# Patient Record
Sex: Female | Born: 1985 | Race: Black or African American | Hispanic: No | Marital: Single | Smoking: Former smoker
Health system: Southern US, Community
[De-identification: ages and names within clinical notes are randomized; demographics above are authoritative.]

## PROBLEM LIST (undated history)

## (undated) ENCOUNTER — Inpatient Hospital Stay (HOSPITAL_COMMUNITY): Payer: Self-pay

## (undated) ENCOUNTER — Ambulatory Visit: Payer: MEDICAID

## (undated) DIAGNOSIS — E785 Hyperlipidemia, unspecified: Secondary | ICD-10-CM

## (undated) DIAGNOSIS — B379 Candidiasis, unspecified: Secondary | ICD-10-CM

## (undated) DIAGNOSIS — E669 Obesity, unspecified: Secondary | ICD-10-CM

## (undated) DIAGNOSIS — N760 Acute vaginitis: Secondary | ICD-10-CM

## (undated) DIAGNOSIS — M779 Enthesopathy, unspecified: Secondary | ICD-10-CM

## (undated) DIAGNOSIS — R29898 Other symptoms and signs involving the musculoskeletal system: Secondary | ICD-10-CM

## (undated) DIAGNOSIS — F319 Bipolar disorder, unspecified: Secondary | ICD-10-CM

## (undated) DIAGNOSIS — R87619 Unspecified abnormal cytological findings in specimens from cervix uteri: Secondary | ICD-10-CM

## (undated) DIAGNOSIS — Z22330 Carrier of Group B streptococcus: Secondary | ICD-10-CM

## (undated) DIAGNOSIS — F32A Depression, unspecified: Secondary | ICD-10-CM

## (undated) DIAGNOSIS — T8859XA Other complications of anesthesia, initial encounter: Secondary | ICD-10-CM

## (undated) DIAGNOSIS — B9689 Other specified bacterial agents as the cause of diseases classified elsewhere: Secondary | ICD-10-CM

## (undated) DIAGNOSIS — A599 Trichomoniasis, unspecified: Secondary | ICD-10-CM

## (undated) DIAGNOSIS — F419 Anxiety disorder, unspecified: Secondary | ICD-10-CM

## (undated) DIAGNOSIS — F329 Major depressive disorder, single episode, unspecified: Secondary | ICD-10-CM

## (undated) DIAGNOSIS — G5600 Carpal tunnel syndrome, unspecified upper limb: Secondary | ICD-10-CM

## (undated) DIAGNOSIS — Z8742 Personal history of other diseases of the female genital tract: Secondary | ICD-10-CM

## (undated) DIAGNOSIS — O24419 Gestational diabetes mellitus in pregnancy, unspecified control: Secondary | ICD-10-CM

## (undated) DIAGNOSIS — IMO0002 Reserved for concepts with insufficient information to code with codable children: Secondary | ICD-10-CM

## (undated) DIAGNOSIS — D649 Anemia, unspecified: Secondary | ICD-10-CM

## (undated) DIAGNOSIS — R87629 Unspecified abnormal cytological findings in specimens from vagina: Secondary | ICD-10-CM

## (undated) DIAGNOSIS — Z9151 Personal history of suicidal behavior: Secondary | ICD-10-CM

## (undated) DIAGNOSIS — Z8619 Personal history of other infectious and parasitic diseases: Secondary | ICD-10-CM

## (undated) DIAGNOSIS — Z915 Personal history of self-harm: Secondary | ICD-10-CM

## (undated) DIAGNOSIS — Z8614 Personal history of Methicillin resistant Staphylococcus aureus infection: Secondary | ICD-10-CM

## (undated) HISTORY — PX: MOUTH SURGERY: SHX715

## (undated) HISTORY — DX: Depression, unspecified: F32.A

## (undated) HISTORY — DX: Unspecified abnormal cytological findings in specimens from vagina: R87.629

## (undated) HISTORY — DX: Personal history of Methicillin resistant Staphylococcus aureus infection: Z86.14

## (undated) HISTORY — DX: Anxiety disorder, unspecified: F41.9

## (undated) HISTORY — DX: Reserved for concepts with insufficient information to code with codable children: IMO0002

## (undated) HISTORY — DX: Carpal tunnel syndrome, unspecified upper limb: G56.00

## (undated) HISTORY — DX: Personal history of other infectious and parasitic diseases: Z86.19

## (undated) HISTORY — DX: Personal history of suicidal behavior: Z91.51

## (undated) HISTORY — DX: Trichomoniasis, unspecified: A59.9

## (undated) HISTORY — DX: Unspecified abnormal cytological findings in specimens from cervix uteri: R87.619

## (undated) HISTORY — DX: Other symptoms and signs involving the musculoskeletal system: R29.898

## (undated) HISTORY — DX: Other specified bacterial agents as the cause of diseases classified elsewhere: B96.89

## (undated) HISTORY — PX: HERNIA REPAIR: SHX51

## (undated) HISTORY — DX: Obesity, unspecified: E66.9

## (undated) HISTORY — DX: Hyperlipidemia, unspecified: E78.5

## (undated) HISTORY — DX: Personal history of other diseases of the female genital tract: Z87.42

## (undated) HISTORY — DX: Carrier of group B Streptococcus: Z22.330

## (undated) HISTORY — DX: Major depressive disorder, single episode, unspecified: F32.9

## (undated) HISTORY — DX: Bipolar disorder, unspecified: F31.9

## (undated) HISTORY — DX: Personal history of self-harm: Z91.5

## (undated) HISTORY — DX: Acute vaginitis: N76.0

## (undated) HISTORY — DX: Candidiasis, unspecified: B37.9

## (undated) HISTORY — PX: UMBILICAL HERNIA REPAIR: SHX2598

## (undated) HISTORY — DX: Anemia, unspecified: D64.9

---
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
## 2006-10-21 ENCOUNTER — Inpatient Hospital Stay (HOSPITAL_COMMUNITY): Admission: AD | Admit: 2006-10-21 | Discharge: 2006-10-21 | Payer: Self-pay | Admitting: Gynecology

## 2006-10-23 ENCOUNTER — Ambulatory Visit: Payer: Self-pay | Admitting: Internal Medicine

## 2006-10-23 LAB — CONVERTED CEMR LAB
Chlamydia, Swab/Urine, PCR: NEGATIVE
GC Probe Amp, Urine: NEGATIVE

## 2006-12-03 ENCOUNTER — Emergency Department (HOSPITAL_COMMUNITY): Admission: EM | Admit: 2006-12-03 | Discharge: 2006-12-04 | Payer: Self-pay | Admitting: Emergency Medicine

## 2006-12-04 ENCOUNTER — Emergency Department (HOSPITAL_COMMUNITY): Admission: EM | Admit: 2006-12-04 | Discharge: 2006-12-04 | Payer: Self-pay | Admitting: Emergency Medicine

## 2006-12-05 ENCOUNTER — Emergency Department (HOSPITAL_COMMUNITY): Admission: EM | Admit: 2006-12-05 | Discharge: 2006-12-05 | Payer: Self-pay | Admitting: Family Medicine

## 2006-12-09 ENCOUNTER — Ambulatory Visit: Payer: Self-pay | Admitting: Internal Medicine

## 2006-12-17 ENCOUNTER — Ambulatory Visit: Payer: Self-pay | Admitting: Internal Medicine

## 2006-12-30 ENCOUNTER — Ambulatory Visit: Payer: Self-pay | Admitting: Internal Medicine

## 2007-02-18 ENCOUNTER — Ambulatory Visit: Payer: Self-pay | Admitting: Internal Medicine

## 2007-03-12 DIAGNOSIS — Z8742 Personal history of other diseases of the female genital tract: Secondary | ICD-10-CM

## 2007-03-12 HISTORY — DX: Personal history of other diseases of the female genital tract: Z87.42

## 2007-04-01 ENCOUNTER — Ambulatory Visit (HOSPITAL_COMMUNITY): Admission: RE | Admit: 2007-04-01 | Discharge: 2007-04-01 | Payer: Self-pay | Admitting: Family Medicine

## 2007-04-29 ENCOUNTER — Ambulatory Visit (HOSPITAL_COMMUNITY): Admission: RE | Admit: 2007-04-29 | Discharge: 2007-04-29 | Payer: Self-pay | Admitting: Family Medicine

## 2007-05-20 ENCOUNTER — Ambulatory Visit (HOSPITAL_COMMUNITY): Admission: RE | Admit: 2007-05-20 | Discharge: 2007-05-20 | Payer: Self-pay | Admitting: Obstetrics & Gynecology

## 2007-10-13 ENCOUNTER — Inpatient Hospital Stay (HOSPITAL_COMMUNITY): Admission: AD | Admit: 2007-10-13 | Discharge: 2007-10-16 | Payer: Self-pay | Admitting: Obstetrics & Gynecology

## 2008-04-24 ENCOUNTER — Emergency Department (HOSPITAL_COMMUNITY): Admission: EM | Admit: 2008-04-24 | Discharge: 2008-04-24 | Payer: Self-pay | Admitting: Emergency Medicine

## 2008-08-18 ENCOUNTER — Inpatient Hospital Stay (HOSPITAL_COMMUNITY): Admission: AD | Admit: 2008-08-18 | Discharge: 2008-08-19 | Payer: Self-pay | Admitting: Obstetrics and Gynecology

## 2009-01-21 ENCOUNTER — Inpatient Hospital Stay (HOSPITAL_COMMUNITY): Admission: AD | Admit: 2009-01-21 | Discharge: 2009-01-21 | Payer: Self-pay | Admitting: Obstetrics and Gynecology

## 2009-02-14 ENCOUNTER — Inpatient Hospital Stay (HOSPITAL_COMMUNITY): Admission: AD | Admit: 2009-02-14 | Discharge: 2009-02-14 | Payer: Self-pay | Admitting: Obstetrics and Gynecology

## 2009-03-01 ENCOUNTER — Inpatient Hospital Stay (HOSPITAL_COMMUNITY): Admission: AD | Admit: 2009-03-01 | Discharge: 2009-03-01 | Payer: Self-pay | Admitting: Obstetrics and Gynecology

## 2009-03-11 DIAGNOSIS — Z8614 Personal history of Methicillin resistant Staphylococcus aureus infection: Secondary | ICD-10-CM

## 2009-03-11 HISTORY — DX: Personal history of Methicillin resistant Staphylococcus aureus infection: Z86.14

## 2009-03-27 ENCOUNTER — Inpatient Hospital Stay (HOSPITAL_COMMUNITY): Admission: AD | Admit: 2009-03-27 | Discharge: 2009-03-27 | Payer: Self-pay | Admitting: Obstetrics and Gynecology

## 2009-03-29 ENCOUNTER — Inpatient Hospital Stay (HOSPITAL_COMMUNITY): Admission: AD | Admit: 2009-03-29 | Discharge: 2009-04-01 | Payer: Self-pay | Admitting: Obstetrics and Gynecology

## 2009-04-02 ENCOUNTER — Inpatient Hospital Stay (HOSPITAL_COMMUNITY): Admission: AD | Admit: 2009-04-02 | Discharge: 2009-04-02 | Payer: Self-pay | Admitting: Obstetrics and Gynecology

## 2010-03-11 NOTE — L&D Delivery Note (Signed)
Delivery Note    At 10:48 PM a viable female was delivered via Vaginal, Spontaneous Delivery (Presentation: OA).  APGAR: 8, 9; weight 6 lb 1.9 oz (2775 g).   Placenta status: Intact, Spontaneous.  Cord: 3 vessels with the following complications: Hyperspiraled.    Anesthesia: None  Episiotomy: None Lacerations: None Suture Repair: n/a Est. Blood Loss (mL): 200  Mom to postpartum.  Baby to nursery-stable Pt has not decided RE: circumcision   Dr Estanislado Pandy notified  Malissa Hippo 01/21/2011, 12:45 AM

## 2010-05-09 DIAGNOSIS — B9689 Other specified bacterial agents as the cause of diseases classified elsewhere: Secondary | ICD-10-CM

## 2010-05-09 DIAGNOSIS — Z8742 Personal history of other diseases of the female genital tract: Secondary | ICD-10-CM

## 2010-05-09 HISTORY — DX: Personal history of other diseases of the female genital tract: Z87.42

## 2010-05-09 HISTORY — DX: Other specified bacterial agents as the cause of diseases classified elsewhere: B96.89

## 2010-05-27 LAB — URINALYSIS, ROUTINE W REFLEX MICROSCOPIC
Bilirubin Urine: NEGATIVE
Glucose, UA: NEGATIVE mg/dL
Ketones, ur: NEGATIVE mg/dL
Nitrite: NEGATIVE
Protein, ur: NEGATIVE mg/dL
Specific Gravity, Urine: 1.01 (ref 1.005–1.030)
Urobilinogen, UA: 0.2 mg/dL (ref 0.0–1.0)
pH: 6.5 (ref 5.0–8.0)

## 2010-05-27 LAB — CBC
HCT: 31.8 % — ABNORMAL LOW (ref 36.0–46.0)
HCT: 34.5 % — ABNORMAL LOW (ref 36.0–46.0)
Hemoglobin: 10.2 g/dL — ABNORMAL LOW (ref 12.0–15.0)
Hemoglobin: 10.4 g/dL — ABNORMAL LOW (ref 12.0–15.0)
Hemoglobin: 11.1 g/dL — ABNORMAL LOW (ref 12.0–15.0)
MCHC: 32.4 g/dL (ref 30.0–36.0)
MCHC: 32.7 g/dL (ref 30.0–36.0)
MCV: 82.2 fL (ref 78.0–100.0)
MCV: 82.5 fL (ref 78.0–100.0)
Platelets: 249 K/uL (ref 150–400)
Platelets: 258 10*3/uL (ref 150–400)
RBC: 3.81 MIL/uL — ABNORMAL LOW (ref 3.87–5.11)
RBC: 3.87 MIL/uL (ref 3.87–5.11)
RDW: 15.7 % — ABNORMAL HIGH (ref 11.5–15.5)
RDW: 16 % — ABNORMAL HIGH (ref 11.5–15.5)
WBC: 7.6 K/uL (ref 4.0–10.5)
WBC: 8.9 10*3/uL (ref 4.0–10.5)
WBC: 9.4 10*3/uL (ref 4.0–10.5)

## 2010-05-27 LAB — DIFFERENTIAL
Basophils Absolute: 0.1 K/uL (ref 0.0–0.1)
Basophils Relative: 1 % (ref 0–1)
Eosinophils Absolute: 0.2 K/uL (ref 0.0–0.7)
Eosinophils Relative: 2 % (ref 0–5)
Lymphocytes Relative: 11 % — ABNORMAL LOW (ref 12–46)
Lymphs Abs: 0.8 K/uL (ref 0.7–4.0)
Monocytes Absolute: 1.9 K/uL — ABNORMAL HIGH (ref 0.1–1.0)
Monocytes Relative: 25 % — ABNORMAL HIGH (ref 3–12)
Neutro Abs: 4.6 K/uL (ref 1.7–7.7)
Neutrophils Relative %: 61 % (ref 43–77)

## 2010-05-27 LAB — URINE CULTURE: Colony Count: 40000

## 2010-05-27 LAB — URINE MICROSCOPIC-ADD ON

## 2010-06-11 LAB — CBC
HCT: 32 % — ABNORMAL LOW (ref 36.0–46.0)
Hemoglobin: 10.4 g/dL — ABNORMAL LOW (ref 12.0–15.0)
MCHC: 32.7 g/dL (ref 30.0–36.0)
RBC: 3.88 MIL/uL (ref 3.87–5.11)

## 2010-06-11 LAB — URINALYSIS, ROUTINE W REFLEX MICROSCOPIC
Bilirubin Urine: NEGATIVE
Hgb urine dipstick: NEGATIVE
Ketones, ur: NEGATIVE mg/dL
Specific Gravity, Urine: 1.01 (ref 1.005–1.030)
Urobilinogen, UA: 0.2 mg/dL (ref 0.0–1.0)

## 2010-06-11 LAB — COMPREHENSIVE METABOLIC PANEL
Alkaline Phosphatase: 158 U/L — ABNORMAL HIGH (ref 39–117)
BUN: 2 mg/dL — ABNORMAL LOW (ref 6–23)
Chloride: 105 mEq/L (ref 96–112)
Creatinine, Ser: 0.5 mg/dL (ref 0.4–1.2)
Glucose, Bld: 74 mg/dL (ref 70–99)
Potassium: 3.9 mEq/L (ref 3.5–5.1)
Total Bilirubin: 0.2 mg/dL — ABNORMAL LOW (ref 0.3–1.2)
Total Protein: 6.3 g/dL (ref 6.0–8.3)

## 2010-06-11 LAB — URINE MICROSCOPIC-ADD ON

## 2010-06-11 LAB — URINE CULTURE

## 2010-06-12 LAB — WET PREP, GENITAL: Clue Cells Wet Prep HPF POC: NONE SEEN

## 2010-06-13 LAB — GC/CHLAMYDIA PROBE AMP, GENITAL: GC Probe Amp, Genital: NEGATIVE

## 2010-06-13 LAB — URINE MICROSCOPIC-ADD ON

## 2010-06-13 LAB — WET PREP, GENITAL
Clue Cells Wet Prep HPF POC: NONE SEEN
Trich, Wet Prep: NONE SEEN
Yeast Wet Prep HPF POC: NONE SEEN

## 2010-06-13 LAB — URINALYSIS, ROUTINE W REFLEX MICROSCOPIC
Nitrite: NEGATIVE
Specific Gravity, Urine: 1.01 (ref 1.005–1.030)
Urobilinogen, UA: 0.2 mg/dL (ref 0.0–1.0)

## 2010-06-18 LAB — CBC
HCT: 36.9 % (ref 36.0–46.0)
Hemoglobin: 12.4 g/dL (ref 12.0–15.0)
MCHC: 33.5 g/dL (ref 30.0–36.0)
MCV: 82.1 fL (ref 78.0–100.0)
Platelets: 296 10*3/uL (ref 150–400)
RDW: 15.8 % — ABNORMAL HIGH (ref 11.5–15.5)

## 2010-06-18 LAB — WET PREP, GENITAL
Clue Cells Wet Prep HPF POC: NONE SEEN
Trich, Wet Prep: NONE SEEN

## 2010-06-18 LAB — ABO/RH: ABO/RH(D): A POS

## 2010-06-18 LAB — URINALYSIS, ROUTINE W REFLEX MICROSCOPIC
Bilirubin Urine: NEGATIVE
Glucose, UA: NEGATIVE mg/dL
Hgb urine dipstick: NEGATIVE
Ketones, ur: 15 mg/dL — AB
Protein, ur: NEGATIVE mg/dL
Urobilinogen, UA: 0.2 mg/dL (ref 0.0–1.0)

## 2010-06-18 LAB — GC/CHLAMYDIA PROBE AMP, GENITAL: GC Probe Amp, Genital: NEGATIVE

## 2010-06-26 LAB — URINALYSIS, ROUTINE W REFLEX MICROSCOPIC
Glucose, UA: NEGATIVE mg/dL
Hgb urine dipstick: NEGATIVE
Ketones, ur: NEGATIVE mg/dL
Protein, ur: 30 mg/dL — AB

## 2010-06-26 LAB — COMPREHENSIVE METABOLIC PANEL
ALT: 23 U/L (ref 0–35)
AST: 25 U/L (ref 0–37)
Alkaline Phosphatase: 69 U/L (ref 39–117)
CO2: 22 mEq/L (ref 19–32)
Calcium: 9.3 mg/dL (ref 8.4–10.5)
GFR calc Af Amer: >60 mL/min (ref >60)
GFR calc non Af Amer: >60 mL/min (ref >60)
Potassium: 3.8 mEq/L (ref 3.5–5.1)
Sodium: 137 mEq/L (ref 135–145)

## 2010-06-26 LAB — CBC
Hemoglobin: 12.7 g/dL (ref 12.0–15.0)
MCHC: 33.2 g/dL (ref 30.0–36.0)
RBC: 4.78 MIL/uL (ref 3.87–5.11)
WBC: 10.5 10*3/uL (ref 4.0–10.5)

## 2010-06-26 LAB — DIFFERENTIAL
Basophils Relative: 0 % (ref 0–1)
Eosinophils Absolute: 0 10*3/uL (ref 0.0–0.7)
Eosinophils Relative: 0 % (ref 0–5)
Lymphs Abs: 0.3 10*3/uL — ABNORMAL LOW (ref 0.7–4.0)
Monocytes Relative: 3 % (ref 3–12)
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
## 2010-09-28 ENCOUNTER — Emergency Department (HOSPITAL_COMMUNITY)
Admission: EM | Admit: 2010-09-28 | Discharge: 2010-09-29 | Disposition: A | Discharge status: Home / Self Care | Payer: Medicaid Other | Attending: Emergency Medicine | Admitting: Emergency Medicine

## 2010-09-28 DIAGNOSIS — O99891 Other specified diseases and conditions complicating pregnancy: Secondary | ICD-10-CM | POA: Insufficient documentation

## 2010-09-28 DIAGNOSIS — R6889 Other general symptoms and signs: Secondary | ICD-10-CM | POA: Insufficient documentation

## 2010-09-28 DIAGNOSIS — E669 Obesity, unspecified: Secondary | ICD-10-CM | POA: Insufficient documentation

## 2010-09-28 DIAGNOSIS — K219 Gastro-esophageal reflux disease without esophagitis: Secondary | ICD-10-CM | POA: Insufficient documentation

## 2010-11-23 ENCOUNTER — Encounter (HOSPITAL_COMMUNITY): Payer: Self-pay

## 2010-11-23 ENCOUNTER — Inpatient Hospital Stay (HOSPITAL_COMMUNITY)
Admission: AD | Admit: 2010-11-23 | Discharge: 2010-11-23 | Disposition: A | Discharge status: Home / Self Care | Payer: Medicaid Other | Source: Ambulatory Visit | Attending: Obstetrics and Gynecology | Admitting: Obstetrics and Gynecology

## 2010-11-23 DIAGNOSIS — O4700 False labor before 37 completed weeks of gestation, unspecified trimester: Secondary | ICD-10-CM | POA: Insufficient documentation

## 2010-11-23 DIAGNOSIS — O09219 Supervision of pregnancy with history of pre-term labor, unspecified trimester: Secondary | ICD-10-CM

## 2010-11-23 MED ORDER — BETAMETHASONE SOD PHOS & ACET 6 (3-3) MG/ML IJ SUSP
12.0000 mg | Freq: Once | INTRAMUSCULAR | Status: AC
  Administered 2010-11-23: 12 mg via INTRAMUSCULAR
  Filled 2010-11-23: qty 2

## 2010-11-23 NOTE — Progress Notes (Signed)
Positive FFN two days ago, having abdominal pain, cervix closed was sent from MD office to be monitored.
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
## 2010-11-24 ENCOUNTER — Inpatient Hospital Stay (HOSPITAL_COMMUNITY)
Admission: AD | Admit: 2010-11-24 | Discharge: 2010-11-24 | Disposition: A | Discharge status: Home / Self Care | Payer: Medicaid Other | Source: Ambulatory Visit | Attending: Obstetrics and Gynecology | Admitting: Obstetrics and Gynecology

## 2010-11-24 DIAGNOSIS — O4700 False labor before 37 completed weeks of gestation, unspecified trimester: Secondary | ICD-10-CM | POA: Insufficient documentation

## 2010-11-24 MED ORDER — BETAMETHASONE SOD PHOS & ACET 6 (3-3) MG/ML IJ SUSP
12.5000 mg | Freq: Once | INTRAMUSCULAR | Status: AC
  Administered 2010-11-24: 12.5 mg via INTRAMUSCULAR
  Filled 2010-11-24: qty 2.1

## 2010-12-07 LAB — CBC
HCT: 32.9 — ABNORMAL LOW
HCT: 33.5 — ABNORMAL LOW
Hemoglobin: 10.7 — ABNORMAL LOW
Hemoglobin: 10.7 — ABNORMAL LOW
MCHC: 32.6
MCV: 82.5
MCV: 82.6
RBC: 4.06
RDW: 16.6 — ABNORMAL HIGH
WBC: 8.3

## 2010-12-20 LAB — URINALYSIS, ROUTINE W REFLEX MICROSCOPIC
Bilirubin Urine: NEGATIVE
Glucose, UA: NEGATIVE
Hgb urine dipstick: NEGATIVE
Leukocytes, UA: NEGATIVE
Nitrite: NEGATIVE
Protein, ur: 30 — AB
Specific Gravity, Urine: 1.043 — ABNORMAL HIGH
Urobilinogen, UA: 0.2
pH: 5.5

## 2010-12-20 LAB — PREGNANCY, URINE: Preg Test, Ur: NEGATIVE

## 2010-12-20 LAB — STREP A DNA PROBE: Group A Strep Probe: NEGATIVE

## 2010-12-20 LAB — URINE MICROSCOPIC-ADD ON

## 2010-12-24 LAB — URINE MICROSCOPIC-ADD ON

## 2010-12-24 LAB — URINALYSIS, ROUTINE W REFLEX MICROSCOPIC
Nitrite: NEGATIVE
Protein, ur: NEGATIVE
Specific Gravity, Urine: 1.015
Urobilinogen, UA: 0.2

## 2011-01-16 ENCOUNTER — Inpatient Hospital Stay (HOSPITAL_COMMUNITY): Payer: Medicaid Other

## 2011-01-16 ENCOUNTER — Inpatient Hospital Stay (HOSPITAL_COMMUNITY)
Admission: AD | Admit: 2011-01-16 | Discharge: 2011-01-16 | Disposition: A | Discharge status: Home / Self Care | Payer: Medicaid Other | Source: Ambulatory Visit | Attending: Obstetrics and Gynecology | Admitting: Obstetrics and Gynecology

## 2011-01-16 DIAGNOSIS — O368390 Maternal care for abnormalities of the fetal heart rate or rhythm, unspecified trimester, not applicable or unspecified: Secondary | ICD-10-CM | POA: Insufficient documentation

## 2011-01-16 DIAGNOSIS — R109 Unspecified abdominal pain: Secondary | ICD-10-CM | POA: Insufficient documentation

## 2011-01-16 MED ORDER — HYDROXYZINE PAMOATE 50 MG PO CAPS
50.0000 mg | ORAL_CAPSULE | ORAL | Status: DC | PRN
Start: 2011-01-16 — End: 2011-01-19

## 2011-01-16 NOTE — Progress Notes (Signed)
Patient states she started having abdominal and back pain 2-3 hours ago with spotting. Not sure if contractions. Reports good fetal movement.

## 2011-01-16 NOTE — ED Provider Notes (Signed)
History     Chief Complaint  Patient presents with  . Abdominal Pain   HPI Comments: Pt is a G3P2 at [redacted]w[redacted]d arrived with CC of "cramping" states they are menstrual like and feels ctx when she's up ambulating, but when she's resting just cramping. Reports +FM, nl d/c, no LOF, says she had some "spotting" on her tissue a couple times.  Pregnancy significant for: 1. Freq UTI 2. Hx depression/suicide attempt x1 3. Hx hernia repair 4. Latex allergy 5. Hx MRSA in urine 6. Hx abnl pap - needs colpo pp 7. Hx anemia 8. Hx STI's 9. Hx smoking  Patient is a 25 y.o. female presenting with abdominal pain.  Abdominal Pain The primary symptoms of the illness include abdominal pain.      Past Medical History  Diagnosis Date  . No pertinent past medical history     Past Surgical History  Procedure Date  . Umbilical hernia repair   . Mouth surgery     No family history on file.  History  Substance Use Topics  . Smoking status: Never Smoker   . Smokeless tobacco: Not on file  . Alcohol Use: No    Allergies: No Known Allergieslatex, shrimp   Prescriptions prior to admission  Medication Sig Dispense Refill  . prenatal vitamin w/FE, FA (PRENATAL 1 + 1) 27-1 MG TABS Take 1 tablet by mouth daily.          Review of Systems  Gastrointestinal: Positive for abdominal pain.       Describes as cramping off and on and occ ctx Has nl d/c Cx=1.5/50/-2 ballottable   All other systems reviewed and are negative.   Physical Exam   Blood pressure 124/63, pulse 101, temperature 99.5 F (37.5 C), temperature source Oral, resp. rate 20, height 5\' 2"  (1.575 m), weight 247 lb 6.4 oz (112.22 kg), SpO2 99.00%.  Physical Exam  Nursing note and vitals reviewed. Constitutional: She is oriented to person, place, and time. She appears well-developed and well-nourished.  Neck: Normal range of motion.  Cardiovascular: Normal rate.   Respiratory: Effort normal.  GI: Soft. There is tenderness.    Genitourinary: Vagina normal.  Musculoskeletal: Normal range of motion.  Neurological: She is alert and oriented to person, place, and time.  Skin: Skin is warm and dry.  Psychiatric: She has a normal mood and affect. Her behavior is normal.   FHR 140 10x10 accels at this time, mod variability, no decels Toco appears irritable at this time MAU Course  Procedures   Assessment and Plan  IUP at [redacted]w[redacted]d FHR reassuring - will continue to observe for 15x15 accels  Will consider vistaril for D/C  Ardit Danh M 01/16/2011, 3:48 PM   01/16/2011 at 1729  FHR 15x15 accels noted, then one variable decel with decreased variability noted after.  BPP 8/8 AFI WNL  Toco rare ctx,   FHR cat I No active labor  Will d/c pt home with rx for po vistaril [facility] and labor precautions Pt has office visit 11/9, will discuss IOL then  S.Loa Idler, CNM

## 2011-01-18 ENCOUNTER — Other Ambulatory Visit: Payer: Self-pay | Admitting: Obstetrics and Gynecology

## 2011-01-20 ENCOUNTER — Inpatient Hospital Stay (HOSPITAL_COMMUNITY)
Admission: RE | Admit: 2011-01-20 | Discharge: 2011-01-22 | DRG: 775 | Disposition: A | Discharge status: Home / Self Care | Payer: Medicaid Other | Source: Ambulatory Visit | Attending: Obstetrics and Gynecology | Admitting: Obstetrics and Gynecology

## 2011-01-20 ENCOUNTER — Encounter (HOSPITAL_COMMUNITY): Payer: Self-pay

## 2011-01-20 DIAGNOSIS — Z3490 Encounter for supervision of normal pregnancy, unspecified, unspecified trimester: Secondary | ICD-10-CM

## 2011-01-20 LAB — CBC
MCHC: 32 g/dL (ref 30.0–36.0)
MCV: 79.6 fL (ref 78.0–100.0)
Platelets: 195 10*3/uL (ref 150–400)
RDW: 16.4 % — ABNORMAL HIGH (ref 11.5–15.5)
WBC: 6.9 10*3/uL (ref 4.0–10.5)

## 2011-01-20 MED ORDER — LACTATED RINGERS IV SOLN
INTRAVENOUS | Status: DC
  Administered 2011-01-20 (×2): 125 mL/h via INTRAVENOUS

## 2011-01-20 MED ORDER — OXYTOCIN 20 UNITS IN LACTATED RINGERS INFUSION - SIMPLE
125.0000 mL/h | Freq: Once | INTRAVENOUS | Status: AC
  Administered 2011-01-20: 125 mL/h via INTRAVENOUS

## 2011-01-20 MED ORDER — ACETAMINOPHEN 325 MG PO TABS: 650.0000 mg | ORAL_TABLET | ORAL | Status: DC | PRN

## 2011-01-20 MED ORDER — CITRIC ACID-SODIUM CITRATE 334-500 MG/5ML PO SOLN: 30.0000 mL | ORAL | Status: DC | PRN

## 2011-01-20 MED ORDER — IBUPROFEN 600 MG PO TABS: 600.0000 mg | ORAL_TABLET | Freq: Four times a day (QID) | ORAL | Status: DC | PRN

## 2011-01-20 MED ORDER — ZOLPIDEM TARTRATE 10 MG PO TABS: 10.0000 mg | ORAL_TABLET | Freq: Every evening | ORAL | Status: DC | PRN

## 2011-01-20 MED ORDER — FLEET ENEMA 7-19 GM/118ML RE ENEM: 1.0000 | ENEMA | RECTAL | Status: DC | PRN

## 2011-01-20 MED ORDER — OXYTOCIN 20 UNITS IN LACTATED RINGERS INFUSION - SIMPLE
1.0000 m[IU]/min | INTRAVENOUS | Status: DC
  Administered 2011-01-20: 1 m[IU]/min via INTRAVENOUS
  Administered 2011-01-20: 4 m[IU]/min via INTRAVENOUS
  Administered 2011-01-20: 2 m[IU]/min via INTRAVENOUS
  Filled 2011-01-20: qty 1000

## 2011-01-20 MED ORDER — BUTORPHANOL TARTRATE 2 MG/ML IJ SOLN
1.0000 mg | Freq: Once | INTRAMUSCULAR | Status: AC
  Administered 2011-01-20: 1 mg via INTRAVENOUS

## 2011-01-20 MED ORDER — OXYCODONE-ACETAMINOPHEN 5-325 MG PO TABS: 2.0000 | ORAL_TABLET | ORAL | Status: DC | PRN

## 2011-01-20 MED ORDER — ONDANSETRON HCL 4 MG/2ML IJ SOLN: 4.0000 mg | Freq: Four times a day (QID) | INTRAMUSCULAR | Status: DC | PRN

## 2011-01-20 MED ORDER — OXYTOCIN BOLUS FROM INFUSION
500.0000 mL | Freq: Once | INTRAVENOUS | Status: DC
  Filled 2011-01-20: qty 500

## 2011-01-20 MED ORDER — LACTATED RINGERS IV SOLN: 500.0000 mL | INTRAVENOUS | Status: DC | PRN

## 2011-01-20 MED ORDER — LIDOCAINE HCL (PF) 1 % IJ SOLN
30.0000 mL | INTRAMUSCULAR | Status: DC | PRN
  Filled 2011-01-20: qty 30

## 2011-01-20 MED ORDER — TERBUTALINE SULFATE 1 MG/ML IJ SOLN: 0.2500 mg | Freq: Once | INTRAMUSCULAR | Status: AC | PRN

## 2011-01-20 MED ORDER — BUTORPHANOL TARTRATE 2 MG/ML IJ SOLN
INTRAMUSCULAR | Status: AC
  Filled 2011-01-20: qty 1
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
## 2011-01-20 NOTE — Progress Notes (Signed)
  Subjective: Aware of UCs, but not uncomfortable.  Objective: BP 134/74  Pulse 85  Temp(Src) 97.8 F (36.6 C) (Oral)  Resp 20  Ht 5\' 2"  (1.575 m)  Wt 110.678 kg (244 lb)  BMI 44.63 kg/m2    Pitocin on 6 mu/min  FHT:  FHR: 140s bpm, variability: moderate,  accelerations:  Present,  decelerations:  Absent UC:   regular, every 3 minutes SVE:    Deferred  Assessment / Plan: Induction of labor--latent phase Will continue to observe.  Nigel Bridgeman 01/20/2011, 4:03 PM

## 2011-01-20 NOTE — Progress Notes (Signed)
  Subjective: Just reported SROM.  UCs same, mild/moderate.  Objective: BP 132/77  Pulse 80  Temp(Src) 98.7 F (37.1 C) (Oral)  Resp 20  Ht 5\' 2"  (1.575 m)  Wt 110.678 kg (244 lb)  BMI 44.63 kg/m2   Pitocin on 8 mu/min   FHT:  FHR: 140s bpm, variability: moderate,  accelerations:  Present,  decelerations:  Absent UC:   regular, every 3-5 minutes SVE:   Dilation: 3 Effacement (%): 50 Station: -2 Exam by:: Amanda Church  SROM verified, leaking light MSF  Assessment / Plan SROM, light MSF Induction, on pitocin Will continue pitocin.  :Amanda Church 01/20/2011, 6:11 PM

## 2011-01-20 NOTE — Progress Notes (Signed)
  Subjective: More uncomfortable with UCs.  Still declines pain medication.  Objective: BP 127/70  Pulse 84  Temp(Src) 98.4 F (36.9 C) (Oral)  Resp 20  Ht 5\' 2"  (1.575 m)  Wt 244 lb (110.678 kg)  BMI 44.63 kg/m2    Pitocin on 10 mu/min.  FHT:  FHR: 140s bpm, variability: moderate,  accelerations:  Present,  decelerations:  Absent UC:   regular, every 3-4 minutes SVE:   Dilation: 5.5 Effacement (%): 70 Station: -1;-2 Exam by:: Nigel Bridgeman CNM AROM forewaters, slight MSF.  Vtx well applied to cervix now.  Assessment / Plan: Progressing well on pitocin. Will continue to observe.   Nigel Bridgeman 01/20/2011, 8:27 PM

## 2011-01-20 NOTE — Progress Notes (Signed)
  Subjective: Feeling pressure--considering pain medication.  Objective: BP 129/85  Pulse 82  Temp(Src) 98.4 F (36.9 C) (Oral)  Resp 20  Ht 5\' 2"  (1.575 m)  Wt 244 lb (110.678 kg)  BMI 44.63 kg/m2      FHT:  FHR: 150s bpm, variability: moderate,  accelerations:  Present,  decelerations:  Absent UC:   regular, every 3-4 minutes SVE:   5-6, 80%, vtx -2     Assessment / Plan: Progressing on pitocin Will give pain medication and observe.   Nigel Bridgeman 01/20/2011, 9:08 PM

## 2011-01-20 NOTE — Progress Notes (Signed)
  Subjective: Doing well, aware of UCs, but resting quietly.  Objective: BP 129/68  Pulse 90  Temp(Src) 97.8 F (36.6 C) (Oral)  Resp 20  Ht 5\' 2"  (1.575 m)  Wt 110.678 kg (244 lb)  BMI 44.63 kg/m2      FHT:  FHR: 140 bpm, variability: moderate,  accelerations:  Present,  decelerations:  Absent UC:   regular, every 4 minutes Pitocin on 5 mu/min  Labs: Lab Results  Component Value Date   WBC 6.9 01/20/2011   HGB 10.2* 01/20/2011   HCT 31.9* 01/20/2011   MCV 79.6 01/20/2011   PLT 195 01/20/2011    Assessment / Plan: Induction of labor due to term, elective induction,  progressing well on pitocin Will continue to observe.  Jya Hughston 01/20/2011, 2:07 PM

## 2011-01-20 NOTE — Progress Notes (Signed)
.  Subjective: Relaxed in between ctx after stadol, coping well   Objective: BP 129/85  Pulse 82  Temp(Src) 98.4 F (36.9 C) (Oral)  Resp 20  Ht 5\' 2"  (1.575 m)  Wt 110.678 kg (244 lb)  BMI 44.63 kg/m2   FHT:  FHR: 130 bpm, variability: moderate,  accelerations:  Present,  decelerations:  Present early decels UC:   regular, every 2-3 minutes SVE:   Dilation: 8.5 Effacement (%): 90 Station: -1 Exam by:: S. Lilard CNM  Pitocin at   Assessment / Plan: Induction of labor due to elective,  progressing well on pitocin GBS    Fetal Wellbeing:  Category I Pain Control:  stadol  Dr Estanislado Pandy updated per Emilee Hero, CNM  Kobie Matkins M 01/20/2011, 9:39 PM

## 2011-01-21 ENCOUNTER — Encounter (HOSPITAL_COMMUNITY): Payer: Self-pay

## 2011-01-21 LAB — CBC
Hemoglobin: 9.6 g/dL — ABNORMAL LOW (ref 12.0–15.0)
MCH: 25.3 pg — ABNORMAL LOW (ref 26.0–34.0)
Platelets: 208 10*3/uL (ref 150–400)
RBC: 3.8 MIL/uL — ABNORMAL LOW (ref 3.87–5.11)
WBC: 10.6 10*3/uL — ABNORMAL HIGH (ref 4.0–10.5)

## 2011-01-21 MED ORDER — LANOLIN HYDROUS EX OINT: TOPICAL_OINTMENT | CUTANEOUS | Status: DC | PRN

## 2011-01-21 MED ORDER — DIBUCAINE 1 % RE OINT: 1.0000 "application " | TOPICAL_OINTMENT | RECTAL | Status: DC | PRN

## 2011-01-21 MED ORDER — ONDANSETRON HCL 4 MG PO TABS: 4.0000 mg | ORAL_TABLET | ORAL | Status: DC | PRN

## 2011-01-21 MED ORDER — ZOLPIDEM TARTRATE 5 MG PO TABS: 5.0000 mg | ORAL_TABLET | Freq: Every evening | ORAL | Status: DC | PRN

## 2011-01-21 MED ORDER — SIMETHICONE 80 MG PO CHEW: 80.0000 mg | CHEWABLE_TABLET | ORAL | Status: DC | PRN

## 2011-01-21 MED ORDER — TETANUS-DIPHTH-ACELL PERTUSSIS 5-2.5-18.5 LF-MCG/0.5 IM SUSP
0.5000 mL | Freq: Once | INTRAMUSCULAR | Status: AC
  Administered 2011-01-22: 0.5 mL via INTRAMUSCULAR
  Filled 2011-01-21: qty 0.5

## 2011-01-21 MED ORDER — IBUPROFEN 600 MG PO TABS
600.0000 mg | ORAL_TABLET | Freq: Four times a day (QID) | ORAL | Status: DC
  Administered 2011-01-21 – 2011-01-22 (×6): 600 mg via ORAL
  Filled 2011-01-21 (×5): qty 1

## 2011-01-21 MED ORDER — DIPHENHYDRAMINE HCL 25 MG PO CAPS: 25.0000 mg | ORAL_CAPSULE | Freq: Four times a day (QID) | ORAL | Status: DC | PRN

## 2011-01-21 MED ORDER — PRENATAL PLUS 27-1 MG PO TABS
1.0000 | ORAL_TABLET | Freq: Every day | ORAL | Status: DC
  Administered 2011-01-22: 1 via ORAL
  Filled 2011-01-21 (×2): qty 1

## 2011-01-21 MED ORDER — MEASLES, MUMPS & RUBELLA VAC ~~LOC~~ INJ
0.5000 mL | INJECTION | Freq: Once | SUBCUTANEOUS | Status: DC
  Filled 2011-01-21: qty 0.5

## 2011-01-21 MED ORDER — ONDANSETRON HCL 4 MG/2ML IJ SOLN: 4.0000 mg | INTRAMUSCULAR | Status: DC | PRN

## 2011-01-21 MED ORDER — OXYCODONE-ACETAMINOPHEN 5-325 MG PO TABS
1.0000 | ORAL_TABLET | ORAL | Status: DC | PRN
  Administered 2011-01-21: 1 via ORAL
  Administered 2011-01-21: 2 via ORAL
  Administered 2011-01-21 – 2011-01-22 (×3): 1 via ORAL
  Filled 2011-01-21: qty 1
  Filled 2011-01-21: qty 2
  Filled 2011-01-21 (×3): qty 1

## 2011-01-21 MED ORDER — BENZOCAINE-MENTHOL 20-0.5 % EX AERO: 1.0000 "application " | INHALATION_SPRAY | CUTANEOUS | Status: DC | PRN

## 2011-01-21 MED ORDER — SENNOSIDES-DOCUSATE SODIUM 8.6-50 MG PO TABS
2.0000 | ORAL_TABLET | Freq: Every day | ORAL | Status: DC
  Administered 2011-01-21: 2 via ORAL

## 2011-01-21 MED ORDER — WITCH HAZEL-GLYCERIN EX PADS: 1.0000 "application " | MEDICATED_PAD | CUTANEOUS | Status: DC | PRN

## 2011-01-21 NOTE — Progress Notes (Signed)
Post Partum Day 1 Subjective: no complaints, up ad lib and voiding  Objective: Blood pressure 130/75, pulse 84, temperature 98 F (36.7 C), temperature source Oral, resp. rate 20, height 5\' 2"  (1.575 m), weight 244 lb (110.678 kg), unknown if currently breastfeeding. breastfeeding  Physical Exam:  General: alert and cooperative Lochia: appropriate Uterine Fundus: firm Incision: intact perineum DVT Evaluation: Negative Homan's sign.   Basename 01/21/11 0525 01/20/11 1033  HGB 9.6* 10.2*  HCT 29.9* 31.9*    Assessment/Plan: Plan for discharge tomorrow, Breastfeeding and Contraception undecided. Likely will plan outpatient circumcision   LOS: 1 day   Amanda Church 01/21/2011, 2:21 PM

## 2011-01-21 NOTE — Progress Notes (Signed)
Transferred to room 124 via wheelchair with family at side

## 2011-01-21 NOTE — Progress Notes (Signed)
UR chart review completed.  

## 2011-01-22 MED ORDER — OXYCODONE-ACETAMINOPHEN 5-325 MG PO TABS: 1.0000 | ORAL_TABLET | ORAL | Status: AC | PRN

## 2011-01-22 MED ORDER — IBUPROFEN 600 MG PO TABS: 600.0000 mg | ORAL_TABLET | Freq: Four times a day (QID) | ORAL | Status: DC

## 2011-01-22 MED ORDER — FERROUS SULFATE 325 (65 FE) MG PO TABS: 325.0000 mg | ORAL_TABLET | Freq: Three times a day (TID) | ORAL | Status: DC

## 2011-01-22 NOTE — Progress Notes (Signed)
Post Partum Day 2 Subjective: no complaints, up ad lib, voiding and tolerating PO  Objective: Blood pressure 100/68, pulse 93, temperature 98.1 F (36.7 C), temperature source Oral, resp. rate 18, height 5\' 2"  (1.575 m), weight 110.678 kg (244 lb), SpO2 96.00%, unknown if currently breastfeeding.  Physical Exam:  General: alert Lochia: appropriate Uterine Fundus: firm Incision: NA DVT Evaluation: No evidence of DVT seen on physical exam.   Basename 01/21/11 0525 01/20/11 1033  HGB 9.6* 10.2*  HCT 29.9* 31.9*    Assessment/Plan: Plan for discharge tomorrow, Breastfeeding and Contraception Nexplanon   LOS: 2 days   Rosena Bartle V 01/22/2011, 11:20 AM

## 2011-01-22 NOTE — Discharge Summary (Signed)
Obstetric Discharge Summary Reason for Admission: onset of labor Prenatal Procedures: NST and ultrasound Intrapartum Procedures: spontaneous vaginal delivery Postpartum Procedures: none Complications-Operative and Postpartum: none Hemoglobin  Date Value Range Status  01/21/2011 9.6* 12.0-15.0 (g/dL) Final     HCT  Date Value Range Status  01/21/2011 29.9* 36.0-46.0 (%) Final    Discharge Diagnoses: Term Pregnancy-delivered  Discharge Information: Date: 01/22/2011 Activity: unrestricted Diet: routine Medications: PNV, Ibuprofen, Iron and Percocet Condition: stable Instructions: refer to practice specific booklet Discharge to: home Follow-up Information    Follow up with [facility] in 6 weeks.         Newborn Data: Live born female  Birth Weight: 6 lb 1.9 oz (2775 g) APGAR: 8, 9  Home with mother. Breast Feeding. Nexplanon at six weeks.  Nidia Grogan V 01/22/2011, 11:40 AM

## 2011-01-22 NOTE — Progress Notes (Signed)
Patient was referred for history of depression/anxiety.  * Referral screened out by Clinical Social Worker because none of the following criteria appear to apply:  ~ History of anxiety/depression during this pregnancy, or of post-partum depression.  ~ Diagnosis of anxiety and/or depression within last 3 years  ~ History of depression due to pregnancy loss/loss of child  OR  * Patient's symptoms currently being treated with medication and/or therapy.  Please contact the Clinical Social Worker if needs arise, or by the patient's request.  No depression symptoms experienced in the past 6 years, as per the pt.

## 2011-01-23 ENCOUNTER — Inpatient Hospital Stay (HOSPITAL_COMMUNITY)
Admission: AD | Admit: 2011-01-23 | Discharge: 2011-01-23 | Disposition: A | Discharge status: Home / Self Care | Payer: Medicaid Other | Source: Ambulatory Visit | Attending: Obstetrics and Gynecology | Admitting: Obstetrics and Gynecology

## 2011-01-23 DIAGNOSIS — B9789 Other viral agents as the cause of diseases classified elsewhere: Secondary | ICD-10-CM | POA: Insufficient documentation

## 2011-01-23 DIAGNOSIS — O99893 Other specified diseases and conditions complicating puerperium: Secondary | ICD-10-CM | POA: Insufficient documentation

## 2011-01-23 LAB — CBC
HCT: 29.5 % — ABNORMAL LOW (ref 36.0–46.0)
Platelets: 196 10*3/uL (ref 150–400)
RBC: 3.71 MIL/uL — ABNORMAL LOW (ref 3.87–5.11)
RDW: 16.4 % — ABNORMAL HIGH (ref 11.5–15.5)
WBC: 7.6 10*3/uL (ref 4.0–10.5)

## 2011-01-23 LAB — DIFFERENTIAL
Basophils Absolute: 0 10*3/uL (ref 0.0–0.1)
Lymphocytes Relative: 13 % (ref 12–46)
Lymphs Abs: 1 10*3/uL (ref 0.7–4.0)
Monocytes Absolute: 0.6 10*3/uL (ref 0.1–1.0)
Neutro Abs: 5.9 10*3/uL (ref 1.7–7.7)

## 2011-01-23 LAB — URINALYSIS, ROUTINE W REFLEX MICROSCOPIC
Glucose, UA: NEGATIVE mg/dL
Leukocytes, UA: NEGATIVE
Nitrite: NEGATIVE
Specific Gravity, Urine: 1.02 (ref 1.005–1.030)
pH: 7 (ref 5.0–8.0)

## 2011-01-23 LAB — URINE MICROSCOPIC-ADD ON

## 2011-01-23 LAB — RAPID STREP SCREEN (MED CTR MEBANE ONLY): Streptococcus, Group A Screen (Direct): NEGATIVE

## 2011-01-23 MED ORDER — IBUPROFEN 600 MG PO TABS: 600.0000 mg | ORAL_TABLET | Freq: Four times a day (QID) | ORAL | Status: AC

## 2011-01-23 MED ORDER — ACETAMINOPHEN 500 MG PO TABS: 1000.0000 mg | ORAL_TABLET | Freq: Once | ORAL | Status: DC

## 2011-01-23 MED ORDER — IBUPROFEN 600 MG PO TABS
600.0000 mg | ORAL_TABLET | Freq: Once | ORAL | Status: AC
  Administered 2011-01-23: 600 mg via ORAL
  Filled 2011-01-23: qty 1

## 2011-01-23 NOTE — Progress Notes (Signed)
Patient states she had a vaginal delivery on 11-11. Started having general aching this am. Feels hot then cold. All over soreness, mostly arms and swelling.

## 2011-01-23 NOTE — Progress Notes (Signed)
Pt. States she feels hot and like her fever is breaking. Instructed pt that she should pump again, pump and bottles at bedside. Pt verbalized understanding.

## 2011-01-23 NOTE — Progress Notes (Signed)
Electrical double pump set and applied to both breast. Pt will call for any assistance.

## 2011-01-23 NOTE — ED Provider Notes (Signed)
History    Patient sent by the office for complaints of diffuse myalgia and weakness.   HPI  SVD on Sunday 01/20/11 and discharged home yesterday with a mild sore throat but otherwise fine. This am got up feeling weak with chills. Did not take temp at home. Is breastfeeding with some difficulty at night with cluster feedings. Reports normal lochia. Denies dysuria. No BM since birth.      Past Medical History  Diagnosis Date  . No pertinent past medical history     Past Surgical History  Procedure Date  . Umbilical hernia repair   . Mouth surgery     No family history on file.  History  Substance Use Topics  . Smoking status: Never Smoker   . Smokeless tobacco: Not on file  . Alcohol Use: No    Allergies:  Allergies  Allergen Reactions  . Latex Rash  . Shellfish Allergy Swelling and Other (See Comments)    Numbness/tingling.  Swelling of mouth/tounge, moving towards throat.  Possible shortness of breath.  Took Benadryl 50 mg before swelling affected breathing.    Prescriptions prior to admission  Medication Sig Dispense Refill  . oxyCODONE-acetaminophen (PERCOCET) 5-325 MG per tablet Take 1-2 tablets by mouth every 3 (three) hours as needed (moderate - severe pain).  30 tablet  0  . prenatal vitamin w/FE, FA (PRENATAL 1 + 1) 27-1 MG TABS Take 1 tablet by mouth daily.        Marland Kitchen DISCONTD: ibuprofen (ADVIL,MOTRIN) 600 MG tablet Take 1 tablet (600 mg total) by mouth every 6 (six) hours.  30 tablet  1  . ferrous sulfate (FERROUSUL) 325 (65 FE) MG tablet Take 1 tablet (325 mg total) by mouth 3 (three) times daily with meals.  100 tablet  11    Review of Systems  Constitutional: Positive for chills and malaise/fatigue.  HENT: Positive for sore throat.   Eyes: Negative.   Respiratory: Negative.  Negative for cough, hemoptysis and sputum production.   Cardiovascular: Negative.   Gastrointestinal: Positive for constipation.  Genitourinary: Negative.   Musculoskeletal:  Positive for myalgias.  Skin: Negative.   Neurological: Positive for weakness.  Endo/Heme/Allergies: Negative.   Psychiatric/Behavioral: Negative.    Physical Exam   Blood pressure 110/76, pulse 113, temperature 99.6 F (37.6 C), temperature source Oral, resp. rate 20, height 5\' 4"  (1.626 m), weight 109.861 kg (242 lb 3.2 oz), SpO2 99.00%.  Physical Exam  Constitutional: She is oriented to person, place, and time. She appears well-developed and well-nourished.  HENT:  Head: Normocephalic.  Mouth/Throat: Oropharynx is clear and moist.  Cardiovascular: Normal rate, regular rhythm and normal heart sounds.   Respiratory: Breath sounds normal. Right breast exhibits no mass, no skin change and no tenderness. Left breast exhibits no mass, no skin change and no tenderness.  GI: Soft. Normal appearance and bowel sounds are normal. There is no splenomegaly or hepatomegaly. There is no tenderness. There is no CVA tenderness.  Genitourinary: Uterus normal.  Lymphadenopathy:    She has no cervical adenopathy.  Neurological: She is alert and oriented to person, place, and time.  Skin: Skin is warm.    LABS: Results for WINDSOR, ZIRKELBACH (MRN [id number]) as of 01/23/2011 13:21  Ref. Range 01/23/2011 11:17  WBC Latest Range: 4.0-10.5 K/uL 7.6  RBC Latest Range: 3.87-5.11 MIL/uL 3.71 (L)  HGB Latest Range: 12.0-15.0 g/dL 9.5 (L)  HCT Latest Range: 36.0-46.0 % 29.5 (L)  MCV Latest Range: 78.0-100.0 fL 79.5  MCH Latest  Range: 26.0-34.0 pg 25.6 (L)  MCHC Latest Range: 30.0-36.0 g/dL 91.4  RDW Latest Range: 11.5-15.5 % 16.4 (H)  Platelets Latest Range: 150-400 K/uL 196  Neutrophils Relative Latest Range: 43-77 % 77  Lymphocytes Relative Latest Range: 12-46 % 13  Monocytes Relative Latest Range: 3-12 % 8  Eosinophils Relative Latest Range: 0-5 % 2  Basophils Relative Latest Range: 0-1 % 0  Neutrophils Absolute Latest Range: 1.7-7.7 K/uL 5.9  Lymphocytes Absolute Latest Range: 0.7-4.0 K/uL 1.0    Monocytes Absolute Latest Range: 0.1-1.0 K/uL 0.6  Eosinophils Absolute Latest Range: 0.0-0.7 K/uL 0.1  Basophils Absolute Latest Range: 0.0-0.1 K/uL 0.0    MAU Course  Procedures   Assessment and Plan  PPD #3 with likely viral syndrome. No evidence of bacterial infection.   Rapid Strep test pending Urine to culture D/C home with Ibuprofen 600 mg QID for 48 hours then PRN Recommend Mucinex q 12 hours PRN Call if symptoms worsen or fever > 100.4 Follow-up 6 weeks at [facility] 01/23/2011, 1:21 PM

## 2011-01-24 LAB — URINE CULTURE: Culture  Setup Time: 201211150103

## 2011-05-23 ENCOUNTER — Encounter: Payer: Self-pay | Admitting: Obstetrics and Gynecology

## 2011-06-28 ENCOUNTER — Emergency Department (HOSPITAL_COMMUNITY)
Admission: EM | Admit: 2011-06-28 | Discharge: 2011-06-28 | Disposition: A | Discharge status: Home / Self Care | Payer: Medicaid Other | Attending: Emergency Medicine | Admitting: Emergency Medicine

## 2011-06-28 ENCOUNTER — Encounter (HOSPITAL_COMMUNITY): Payer: Self-pay | Admitting: Emergency Medicine

## 2011-06-28 DIAGNOSIS — E860 Dehydration: Secondary | ICD-10-CM | POA: Insufficient documentation

## 2011-06-28 DIAGNOSIS — K625 Hemorrhage of anus and rectum: Secondary | ICD-10-CM | POA: Insufficient documentation

## 2011-06-28 LAB — BASIC METABOLIC PANEL
CO2: 27 mEq/L (ref 19–32)
GFR calc non Af Amer: >90 mL/min (ref >90)
Glucose, Bld: 104 mg/dL — ABNORMAL HIGH (ref 70–99)
Potassium: 3.7 mEq/L (ref 3.5–5.1)
Sodium: 137 mEq/L (ref 135–145)

## 2011-06-28 LAB — URINALYSIS, ROUTINE W REFLEX MICROSCOPIC
Hgb urine dipstick: NEGATIVE
Nitrite: NEGATIVE
Specific Gravity, Urine: 1.034 — ABNORMAL HIGH (ref 1.005–1.030)
Urobilinogen, UA: 0.2 mg/dL (ref 0.0–1.0)

## 2011-06-28 LAB — DIFFERENTIAL
Eosinophils Absolute: 0.2 10*3/uL (ref 0.0–0.7)
Lymphocytes Relative: 51 % — ABNORMAL HIGH (ref 12–46)
Lymphs Abs: 2.5 10*3/uL (ref 0.7–4.0)
Neutro Abs: 1.7 10*3/uL (ref 1.7–7.7)
Neutrophils Relative %: 35 % — ABNORMAL LOW (ref 43–77)

## 2011-06-28 LAB — CBC
Platelets: 315 10*3/uL (ref 150–400)
RBC: 4.39 MIL/uL (ref 3.87–5.11)
WBC: 4.9 10*3/uL (ref 4.0–10.5)

## 2011-06-28 LAB — POCT PREGNANCY, URINE: Preg Test, Ur: NEGATIVE

## 2011-06-28 NOTE — ED Notes (Signed)
The pt has had frequent bms for the past 2 days .  She noticed some dark blood in her stools today specks.   She just delivered 5 months ago and she had hemorrhoids. She has also been dizzy

## 2011-06-28 NOTE — ED Provider Notes (Signed)
History     CSN: [id number]  Arrival date & time 06/28/11  2005   First MD Initiated Contact with Patient 06/28/11 2128      Chief Complaint  Patient presents with  . Rectal Bleeding    (Consider location/radiation/quality/duration/timing/severity/associated sxs/prior treatment) Patient is a 26 y.o. female presenting with hematochezia. The history is provided by the patient and a parent. No language interpreter was used.  Rectal Bleeding  The current episode started today. The onset was gradual. The problem occurs rarely. The problem has been resolved. The patient is experiencing no pain. The stool is described as soft and mixed with blood. Pertinent negatives include no fever, no abdominal pain, no diarrhea, no hemorrhoids, no nausea, no rectal pain, no vomiting, no hematuria, no vaginal bleeding, no vaginal discharge, no headaches, no coughing and no difficulty breathing. She has been behaving normally. Urine output has been normal. Her past medical history is significant for abdominal surgery. Her past medical history does not include inflammatory bowel disease or a recent illness. There were no sick contacts. She has received no recent medical care.   26 year old female reports rectal bleeding after 10 normal stools. States she had 6 stools yesterday and 4 stools today. She noticed the blood in her stool this morning early. States that she thinks she had a panic attack after that and was short of breath when she saw the blood in her stool. Patient is here with her mother and her 4 children. No active bleeding presently, nausea vomiting, diarrhea, no dizziness, no rectal pain. Her youngest child is 2 months old. No past medical history. Has had an umbilical hernia repair and mouth surgery. In the past. Does have her Medicaid reinstalled. Pending appointment with Dr. Yetta Barre. Denies pain.     Past Medical History  Diagnosis Date  . No pertinent past medical history     Past  Surgical History  Procedure Date  . Umbilical hernia repair   . Mouth surgery     No family history on file.  History  Substance Use Topics  . Smoking status: Never Smoker   . Smokeless tobacco: Not on file  . Alcohol Use: No    OB History    Grav Para Term Preterm Abortions TAB SAB Ect Mult Living   3 3 3       3       Review of Systems  Constitutional: Negative.  Negative for fever.  HENT: Negative.   Eyes: Negative.   Respiratory: Negative.  Negative for cough.   Cardiovascular: Negative.   Gastrointestinal: Positive for hematochezia. Negative for nausea, vomiting, abdominal pain, diarrhea, rectal pain and hemorrhoids.  Genitourinary: Negative for hematuria, vaginal bleeding and vaginal discharge.  Neurological: Negative.  Negative for headaches.  Psychiatric/Behavioral: Negative.   All other systems reviewed and are negative.    Allergies  Latex and Shellfish allergy  Home Medications   Current Outpatient Rx  Name Route Sig Dispense Refill  . PRENATAL PLUS 27-1 MG PO TABS Oral Take 1 tablet by mouth every morning.       BP 120/74  Pulse 95  Temp(Src) 98.3 F (36.8 C) (Oral)  Resp 16  SpO2 98%  LMP 06/04/2011  Physical Exam  Nursing note and vitals reviewed. Constitutional: She is oriented to person, place, and time. She appears well-developed and well-nourished.       obese  HENT:  Head: Normocephalic and atraumatic.  Eyes: Conjunctivae and EOM are normal. Pupils are equal, round, and reactive  to light.  Neck: Normal range of motion. Neck supple.  Cardiovascular: Normal rate.   Pulmonary/Chest: Effort normal and breath sounds normal. No respiratory distress.  Abdominal: Soft. Bowel sounds are normal. She exhibits no distension. There is no tenderness.  Genitourinary: Rectal exam shows tenderness. Rectal exam shows no external hemorrhoid, no internal hemorrhoid, no fissure, no mass and anal tone normal. Guaiac negative stool.  Musculoskeletal: Normal  range of motion. She exhibits no edema and no tenderness.  Neurological: She is alert and oriented to person, place, and time. She has normal reflexes.  Skin: Skin is warm and dry.  Psychiatric: She has a normal mood and affect.    ED Course  Procedures (including critical care time)  Labs Reviewed  CBC - Abnormal; Notable for the following:    Hemoglobin 11.2 (*)    HCT 34.3 (*)    MCH 25.5 (*)    All other components within normal limits  DIFFERENTIAL - Abnormal; Notable for the following:    Neutrophils Relative 35 (*)    Lymphocytes Relative 51 (*)    All other components within normal limits  BASIC METABOLIC PANEL - Abnormal; Notable for the following:    Glucose, Bld 104 (*)    All other components within normal limits  URINALYSIS, ROUTINE W REFLEX MICROSCOPIC - Abnormal; Notable for the following:    APPearance CLOUDY (*)    Specific Gravity, Urine 1.034 (*)    All other components within normal limits  POCT PREGNANCY, URINE  OCCULT BLOOD, POC DEVICE   No results found.   1. Dehydration       MDM  Rectal bleeding this morning after several bowel movements. Hemoglobin 11.5 which is up from 9.5 when it was last taken. Urine specific gravity 1.034. Encouraged to drink plenty of water. Keep appointment with [facility] and recheck hemoglobin. She will return if the bleeding continues and she becomes dizzy.  Labs Reviewed  CBC - Abnormal; Notable for the following:    Hemoglobin 11.2 (*)    HCT 34.3 (*)    MCH 25.5 (*)    All other components within normal limits  DIFFERENTIAL - Abnormal; Notable for the following:    Neutrophils Relative 35 (*)    Lymphocytes Relative 51 (*)    All other components within normal limits  BASIC METABOLIC PANEL - Abnormal; Notable for the following:    Glucose, Bld 104 (*)    All other components within normal limits  URINALYSIS, ROUTINE W REFLEX MICROSCOPIC - Abnormal; Notable for the following:    APPearance CLOUDY (*)     Specific Gravity, Urine 1.034 (*)    All other components within normal limits  POCT PREGNANCY, URINE  OCCULT BLOOD, POC DEVICE          Remi Haggard, NP 06/28/11 2226

## 2011-06-28 NOTE — Discharge Instructions (Signed)
Ms Courser your urine tonight was very concentrated which means you are dehydrated. Drink 8 glasses of water a day. Your hemoglobin was 11.5. Her last hemoglobin was 9.5. You're still slightly anemic. Keep your followup appointment with Dr. Ferd Hibbs and have your hemoglobin rechecked. The tests we did to see if you had rectal bleeding was negative for blood. Having so many bowel movements yesterday and today probably cause an internal hemorrhoid bleeding. Return to the ER for nausea vomiting abdominal cramping or dizziness. States hydrated and drink plenty of fluids.   Dehydration, Adult Dehydration means your body does not have as much fluid as it needs. Your kidneys, brain, and heart will not work properly without the right amount of fluids and salt.  HOME CARE  Ask your doctor how to replace body fluid losses (rehydrate).   Drink enough fluids to keep your pee (urine) clear or pale yellow.   Drink small amounts of fluids often if you feel sick to your stomach (nauseous) or throw up (vomit).   Eat like you normally do.   Avoid:   Foods or drinks high in sugar.   Bubbly (carbonated) drinks.   Juice.   Very hot or cold fluids.   Drinks with caffeine.   Fatty, greasy foods.   Alcohol.   Tobacco.   Eating too much.   Gelatin desserts.   Wash your hands to avoid spreading germs (bacteria, viruses).   Only take medicine as told by your doctor.   Keep all doctor visits as told.  GET HELP RIGHT AWAY IF:   You cannot drink something without throwing up.   You get worse even with treatment.   Your vomit has blood in it or looks greenish.   Your poop (stool) has blood in it or looks black and tarry.   You have not peed in 6 to 8 hours.   You pee a small amount of very dark pee.   You have a fever.   You pass out (faint).   You have belly (abdominal) pain that gets worse or stays in one spot (localizes).   You have a rash, stiff neck, or bad headache.   You get  easily annoyed, sleepy, or are hard to wake up.   You feel weak, dizzy, or very thirsty.  MAKE SURE YOU:   Understand these instructions.   Will watch your condition.   Will get help right away if you are not doing well or get worse.  Document Released: 12/22/2008 Document Revised: 02/14/2011 Document Reviewed: 10/15/2010 [facility] Patient Information 2012 [place], [place].

## 2011-06-28 NOTE — ED Notes (Signed)
PT. REPORTS RECTAL BLEEDING / BLOODY STOOLS THIS MORNING WITH LOW ABDOMINAL CRAMPING  AND NAUSEA.

## 2011-06-29 NOTE — ED Provider Notes (Signed)
Medical screening examination/treatment/procedure(s) were performed by non-physician practitioner and as supervising physician I was immediately available for consultation/collaboration.   Larue Drawdy A Ziaire Hagos, MD 06/29/11 0020 

## 2011-07-01 ENCOUNTER — Emergency Department (HOSPITAL_COMMUNITY)
Admission: EM | Admit: 2011-07-01 | Discharge: 2011-07-01 | Disposition: A | Discharge status: Home / Self Care | Payer: Medicaid Other | Attending: Emergency Medicine | Admitting: Emergency Medicine

## 2011-07-01 ENCOUNTER — Encounter (HOSPITAL_COMMUNITY): Payer: Self-pay | Admitting: *Deleted

## 2011-07-01 ENCOUNTER — Emergency Department (HOSPITAL_COMMUNITY): Payer: Medicaid Other

## 2011-07-01 DIAGNOSIS — R070 Pain in throat: Secondary | ICD-10-CM | POA: Insufficient documentation

## 2011-07-01 DIAGNOSIS — E041 Nontoxic single thyroid nodule: Secondary | ICD-10-CM | POA: Insufficient documentation

## 2011-07-01 DIAGNOSIS — M542 Cervicalgia: Secondary | ICD-10-CM | POA: Insufficient documentation

## 2011-07-01 LAB — RAPID STREP SCREEN (MED CTR MEBANE ONLY): Streptococcus, Group A Screen (Direct): NEGATIVE

## 2011-07-01 LAB — CBC
MCH: 24.6 pg — ABNORMAL LOW (ref 26.0–34.0)
MCHC: 32.5 g/dL (ref 30.0–36.0)
Platelets: 310 10*3/uL (ref 150–400)

## 2011-07-01 LAB — DIFFERENTIAL
Basophils Relative: 1 % (ref 0–1)
Eosinophils Absolute: 0.1 10*3/uL (ref 0.0–0.7)
Neutrophils Relative %: 57 % (ref 43–77)

## 2011-07-01 LAB — MONONUCLEOSIS SCREEN: Mono Screen: NEGATIVE

## 2011-07-01 MED ORDER — IOHEXOL 300 MG/ML  SOLN
70.0000 mL | Freq: Once | INTRAMUSCULAR | Status: AC | PRN
  Administered 2011-07-01: 70 mL via INTRAVENOUS

## 2011-07-01 MED ORDER — MORPHINE SULFATE 4 MG/ML IJ SOLN
4.0000 mg | Freq: Once | INTRAMUSCULAR | Status: AC
  Administered 2011-07-01: 4 mg via INTRAVENOUS
  Filled 2011-07-01: qty 1

## 2011-07-01 MED ORDER — DIPHENHYDRAMINE HCL 50 MG/ML IJ SOLN
50.0000 mg | Freq: Once | INTRAMUSCULAR | Status: AC
  Administered 2011-07-01: 50 mg via INTRAVENOUS
  Filled 2011-07-01: qty 1

## 2011-07-01 MED ORDER — SODIUM CHLORIDE 0.9 % IV SOLN
Freq: Once | INTRAVENOUS | Status: AC
  Administered 2011-07-01: 12:00:00 via INTRAVENOUS

## 2011-07-01 NOTE — Discharge Instructions (Signed)
Your CT scan showed a possible node on your thyroid. Please plan to follow up with your primary care doctor in the next 1-2 days for follow up; you'll need an ultrasound to get this evaluated further. If you develop worsening neck swelling, inability to swallow, high fever, or any other worrisome symptoms, please return to the ER.  Thyroid Diseases Your thyroid is a butterfly-shaped gland in your neck. It is located just above your collarbone. It is one of your endocrine glands, which make hormones. The thyroid helps set your metabolism. Metabolism is how your body gets energy from the foods you eat.  Millions of people have thyroid diseases. Women experience thyroid problems more often than men. In fact, overactive thyroid problems (hyperthyroidism) occur in 1% of all women. If you have a thyroid disease, your body may use energy more slowly or quickly than it should.  Thyroid problems also include an immune disease where your body reacts against your thyroid gland (called thyroiditis). A different problem involves lumps and bumps (called nodules) that develop in the gland. The nodules are usually, but not always, noncancerous. THE MOST COMMON THYROID PROBLEMS AND CAUSES ARE DISCUSSED BELOW There are many causes for thyroid problems. Treatment depends upon the exact diagnosis and includes trying to reset your body's metabolism to a normal rate. Hyperthyroidism Too much thyroid hormone from an overactive thyroid gland is called hyperthyroidism. In hyperthyroidism, the body's metabolism speeds up. One of the most frequent forms of hyperthyroidism is known as Graves' disease. Graves' disease tends to run in families. Although Graves' is thought to be caused by a problem with the immune system, the exact nature of the genetic problem is unknown. Hypothyroidism Too little thyroid hormone from an underactive thyroid gland is called hypothyroidism. In hypothyroidism, the body's metabolism is slowed. Several  things can cause this condition. Most causes affect the thyroid gland directly and hurt its ability to make enough hormone.  Rarely, there may be a pituitary gland tumor (located near the base of the brain). The tumor can block the pituitary from producing thyroid-stimulating hormone (TSH). Your body makes TSH to stimulate the thyroid to work properly. If the pituitary does not make enough TSH, the thyroid fails to make enough hormones needed for good health. Whether the problem is caused by thyroid conditions or by the pituitary gland, the result is that the thyroid is not making enough hormones. Hypothyroidism causes many physical and mental processes to become sluggish. The body consumes less oxygen and produces less body heat. Thyroid Nodules A thyroid nodule is a small swelling or lump in the thyroid gland. They are common. These nodules represent either a growth of thyroid tissue or a fluid-filled cyst. Both form a lump in the thyroid gland. Almost half of all people will have tiny thyroid nodules at some point in their lives. Typically, these are not noticeable until they become large and affect normal thyroid size. Larger nodules that are greater than a half inch across (about 1 centimeter) occur in about 5 percent of people. Although most nodules are not cancerous, people who have them should seek medical care to rule out cancer. Also, some thyroid nodules may produce too much thyroid hormone or become too large. Large nodules or a large gland can interfere with breathing or swallowing or may cause neck discomfort. Other problems Other thyroid problems include cancer and thyroiditis. Thyroiditis is a malfunction of the body's immune system. Normally, the immune system works to defend the body against infection and other  problems. When the immune system is not working properly, it may mistakenly attack normal cells, tissues, and organs. Examples of autoimmune diseases are Hashimoto's thyroiditis  (which causes low thyroid function) and Graves' disease (which causes excess thyroid function). SYMPTOMS  Symptoms vary greatly depending upon the exact type of problem with the thyroid. Hyperthyroidism-is when your thyroid is too active and makes more thyroid hormone than your body needs. The most common cause is Graves' Disease. Too much thyroid hormone can cause some or all of the following symptoms:  Anxiety.   Irritability.   Difficulty sleeping.   Fatigue.   A rapid or irregular heartbeat.   A fine tremor of your hands or fingers.   An increase in perspiration.   Sensitivity to heat.   Weight loss, despite normal food intake.   Brittle hair.   Enlargement of your thyroid gland (goiter).   Light menstrual periods.   Frequent bowel movements.  Graves' disease can specifically cause eye and skin problems. The skin problems involve reddening and swelling of the skin, often on your shins and on the top of your feet. Eye problems can include the following:  Excess tearing and sensation of grit or sand in either or both eyes.   Reddened or inflamed eyes.   Widening of the space between your eyelids.   Swelling of the lids and tissues around the eyes.   Light sensitivity.   Ulcers on the cornea.   Double vision.   Limited eye movements.   Blurred or reduced vision.  Hypothyroidism- is when your thyroid gland is not active enough. This is more common than hyperthyroidism. Symptoms can vary a lot depending of the severity of the hormone deficiency. Symptoms may develop over a long period of time and can include several of the following:  Fatigue.   Sluggishness.   Increased sensitivity to cold.   Constipation.   Pale, dry skin.   A puffy face.   Hoarse voice.   High blood cholesterol level.   Unexplained weight gain.   Muscle aches, tenderness and stiffness.   Pain, stiffness or swelling in your joints.   Muscle weakness.   Heavier than normal  menstrual periods.   Brittle fingernails and hair.   Depression.  Thyroid Nodules - most do not cause signs or symptoms. Occasionally, some may become so large that you can feel or even see the swelling at the base of your neck. You may realize a lump or swelling is there when you are shaving or putting on makeup. Men might become aware of a nodule when shirt collars suddenly feel too tight. Some nodules produce too much thyroid hormone. This can produce the same symptoms as hyperthyroidism (see above). Thyroid nodules are seldom cancerous. However, a nodule is more likely to be malignant (cancerous) if it:  Grows quickly or feels hard.   Causes you to become hoarse or to have trouble swallowing or breathing.   Causes enlarged lymph nodes under your jaw or in your neck.  DIAGNOSIS  Because there are so many possible thyroid conditions, your caregiver may ask for a number of tests. They will do this in order to narrow down the exact diagnosis. These tests can include:  Blood and antibody tests.   Special thyroid scans using small, safe amounts of radioactive iodine.   Ultrasound of the thyroid gland (particularly if there is a nodule or lump).   Biopsy. This is usually done with a special needle. A needle biopsy is a procedure to  obtain a sample of cells from the thyroid. The tissue will be tested in a lab and examined under a microscope.  TREATMENT  Treatment depends on the exact diagnosis. Hyperthyroidism  Beta-blockers help relieve many of the symptoms.   Anti-thyroid medications prevent the thyroid from making excess hormones.   Radioactive iodine treatment can destroy overactive thyroid cells. The iodine can permanently decrease the amount of hormone produced.   Surgery to remove the thyroid gland.   Treatments for eye problems that come from Graves' disease also include medications and special eye surgery, if felt to be appropriate.  Hypothyroidism Thyroid replacement with  levothyroxine is the mainstay of treatment. Treatment with thyroid replacement is usually lifelong and will require monitoring and adjustment from time to time. Thyroid Nodules  Watchful waiting. If a small nodule causes no symptoms or signs of cancer on biopsy, then no treatment may be chosen at first. Re-exam and re-checking blood tests would be the recommended follow-up.   Anti-thyroid medications or radioactive iodine treatment may be recommended if the nodules produce too much thyroid hormone (see Treatment for Hyperthyroidism above).   Alcohol ablation. Injections of small amounts of ethyl alcohol (ethanol) can cause a non-cancerous nodule to shrink in size.   Surgery (see Treatment for Hyperthyroidism above).  HOME CARE INSTRUCTIONS   Take medications as instructed.   Follow through on recommended testing.  SEEK MEDICAL CARE IF:   You feel that you are developing symptoms of Hyperthyroidism or Hypothyroidism as described above.   You develop a new lump/nodule in the neck/thyroid area that you had not noticed before.   You feel that you are having side effects from medicines prescribed.   You develop trouble breathing or swallowing.  SEEK IMMEDIATE MEDICAL CARE IF:   You develop a fever of 102 F (38.9 C) or higher.   You develop severe sweating.   You develop palpitations and/or rapid heart beat.   You develop shortness of breath.   You develop nausea and vomiting.   You develop extreme shakiness.   You develop agitation.   You develop lightheadedness or have a fainting episode.  Document Released: 12/23/2006 Document Revised: 02/14/2011 Document Reviewed: 12/23/2006 [facility] Patient Information 2012 [place], [place].

## 2011-07-01 NOTE — ED Provider Notes (Signed)
Medical screening examination/treatment/procedure(s) were performed by non-physician practitioner and as supervising physician I was immediately available for consultation/collaboration.   Zulma Court M Zacheriah Stumpe, MD 07/01/11 2026 

## 2011-07-01 NOTE — ED Notes (Signed)
Pt to ED with complaint of sore throat since Thursday.

## 2011-07-01 NOTE — ED Provider Notes (Signed)
History     CSN: [id number]  Arrival date & time 07/01/11  4098   First MD Initiated Contact with Patient 07/01/11 1001      Chief Complaint  Patient presents with  . Sore Throat    (Consider location/radiation/quality/duration/timing/severity/associated sxs/prior treatment) HPI History from patient. 26 year old female who presents with neck pain. She states this began last Wednesday. She was sleeping, and awoke feeling as if she was choking. This feeling quickly resolved. She's had a residual feeling of anterior neck pain which she states worsens with swallowing, although she states it does not quite feel like a "normal" sore throat. Denies difficulty breathing or swallowing, although she states that she does have a decreased appetite secondary to pain with swallowing. States that the pain has been more or less constant in nature. No known modifying factors. No treatment prior to arrival. Denies fever, chills, cough, congestion. No known history of thyroid problems, although she does have an aunt with some type of thyroid condition.  Past Medical History  Diagnosis Date  . No pertinent past medical history     Past Surgical History  Procedure Date  . Umbilical hernia repair   . Mouth surgery     History reviewed. No pertinent family history.  History  Substance Use Topics  . Smoking status: Never Smoker   . Smokeless tobacco: Not on file  . Alcohol Use: No    OB History    Grav Para Term Preterm Abortions TAB SAB Ect Mult Living   3 3 3       3       Review of Systems  Constitutional: Positive for appetite change. Negative for fever, chills and activity change.  HENT: Positive for sore throat and neck pain. Negative for facial swelling, rhinorrhea, trouble swallowing and neck stiffness.   Respiratory: Negative for cough, shortness of breath and stridor.   Cardiovascular: Negative for chest pain.  Gastrointestinal: Negative for nausea and vomiting.  Musculoskeletal:  Negative for myalgias.  Skin: Negative for color change and rash.  Neurological: Negative for dizziness, weakness and headaches.    Allergies  Latex and Shellfish allergy  Home Medications   Current Outpatient Rx  Name Route Sig Dispense Refill  . PRENATAL PLUS 27-1 MG PO TABS Oral Take 1 tablet by mouth every morning.       BP 118/60  Pulse 91  Temp(Src) 98 F (36.7 C) (Oral)  Resp 16  SpO2 100%  LMP 06/04/2011  Physical Exam  Nursing note and vitals reviewed. Constitutional: She appears well-developed and well-nourished. No distress.  HENT:  Head: Normocephalic and atraumatic.  Right Ear: External ear normal.  Left Ear: External ear normal.       Oropharynx clear. Tonsils 2+. No tonsillar exudate noted. TMs normal bilaterally.  Eyes: Conjunctivae and EOM are normal. Pupils are equal, round, and reactive to light.  Neck: Normal range of motion. Neck supple.       No cervical adenopathy appreciated. Patient is mildly tender to palpation to anterior neck. Due to body habitus, it is difficult to appreciate any discrete thyromegaly, although she is tender in this area.  Cardiovascular: Normal rate, regular rhythm and normal heart sounds.   Pulmonary/Chest: Effort normal and breath sounds normal. She exhibits no tenderness.  Abdominal: Soft. Bowel sounds are normal. There is no tenderness. There is no rebound and no guarding.  Musculoskeletal: Normal range of motion.  Lymphadenopathy:    She has no cervical adenopathy.  Neurological: She is alert.  Skin: Skin is warm and dry. She is not diaphoretic.       Acanthosis nigricans  Psychiatric: She has a normal mood and affect.    ED Course  Procedures (including critical care time)  Labs Reviewed  CBC - Abnormal; Notable for the following:    Hemoglobin 11.1 (*)    HCT 34.2 (*)    MCV 75.8 (*)    MCH 24.6 (*)    All other components within normal limits  DIFFERENTIAL  MONONUCLEOSIS SCREEN  RAPID STREP SCREEN  POCT  PREGNANCY, URINE  TSH  T4, FREE   Ct Soft Tissue Neck W Contrast  07/01/2011  *RADIOLOGY REPORT*  Clinical Data: Sore throat  CT NECK WITH CONTRAST  Technique:  Multidetector CT imaging of the neck was performed with intravenous contrast.  Contrast: 70mL OMNIPAQUE IOHEXOL 300 MG/ML  SOLN  Comparison: None.  Findings: Lung apices are clear.  No superior mediastinal pathology. Somewhat prominent thymic tissue, but not suspected to be pathologic.  Limited visualization of the intracranial contents does not show any abnormality.  Both parotid glands are normal.  Both submandibular glands are normal.  The thyroid gland shows a nodule projecting inferiorly from the left lobe that measures 17 by 20 x 15 mm in diameter. This appears quite discrete.  This is presumed to be a thyroid nodule though parathyroid mass is possible.  I would suggest non emergent ultrasound for further evaluation.  There is no evidence of mucosal or submucosal mass lesion.  No gross inflammatory change in the region of the tonsils or retropharyngeal space.  No pathologically enlarged lymph nodes on either side of the neck.  No significant bony finding.  Arterial and venous structures appear unremarkable.  IMPRESSION: No inflammatory disease noted within the neck.  17 x 20 x 15 mm nodule inferiorly positioned relative to the left lobe of the thyroid.  This could be a thyroid mass or parathyroid mass.  Non emergent ultrasound suggested for evaluation.  Original Report Authenticated By: Thomasenia Sales, M.D.     1. Thyroid nodule       MDM  Pt presents with neck pain x several days; no evidence for airway compromise on exam. Case discussed with Dr. Patria Mane. Recommended CT of the neck. Patient does have an allergy listed to shellfish in which she apparently had some numbness and tingling with questionable early angioedema. I discussed with the CT tech, who stated they don't consider this a contraindication to contrast. I will plan to  premedicate her with Benadryl IV.  Patient tolerated CT contrast well without incident and was observed for an hour after the procedure. CT, which I personally reviewed, indicates nodule which is parathyroid vs thyroid in origin. Pt did have TSH/T4 drawn earlier in her stay. She is to call Dr. Dimas Millin office to make a follow up tomorrow, to get probable thyroid u/s scheduled. She is aware that she is to return with any problems indicating airway compromise or otherwise worsening condition. She did have a concern about breastfeeding as she was medicated with one dose of morphine here - I did talk with pharmacy who recommended that she "pump and dump" for at least 5 hours after receiving morphine. She verbalized understanding and agreed to plan.        Grant Fontana, [place] 07/01/11 [phone]

## 2011-07-01 NOTE — ED Notes (Signed)
Patient denies sore throat,  She coomplains of neck soreness.  She has swelling noted to bil sides of her neck,  Firm to touch.  She has no hx of thyroid disease but her aunt did.  Patient states she just does not feel good.  She complains of feeling tired,  Irritable, no appetite, and just not good.

## 2011-07-02 ENCOUNTER — Telehealth: Payer: Self-pay | Admitting: Obstetrics and Gynecology

## 2011-07-02 NOTE — Telephone Encounter (Signed)
Spoke with pt rgd masg pt wants to r/s colpo pt has appt 07/18/11 at 10:45 with ND pt voice understanding

## 2011-07-08 ENCOUNTER — Emergency Department (HOSPITAL_COMMUNITY)
Admission: EM | Admit: 2011-07-08 | Discharge: 2011-07-08 | Disposition: A | Discharge status: Home / Self Care | Payer: Medicaid Other | Attending: Emergency Medicine | Admitting: Emergency Medicine

## 2011-07-08 ENCOUNTER — Encounter (HOSPITAL_COMMUNITY): Payer: Self-pay | Admitting: Emergency Medicine

## 2011-07-08 DIAGNOSIS — E010 Iodine-deficiency related diffuse (endemic) goiter: Secondary | ICD-10-CM

## 2011-07-08 DIAGNOSIS — E049 Nontoxic goiter, unspecified: Secondary | ICD-10-CM | POA: Insufficient documentation

## 2011-07-08 DIAGNOSIS — K122 Cellulitis and abscess of mouth: Secondary | ICD-10-CM | POA: Insufficient documentation

## 2011-07-08 MED ORDER — PREDNISONE 10 MG PO TABS: 20.0000 mg | ORAL_TABLET | Freq: Every day | ORAL | Status: DC

## 2011-07-08 MED ORDER — GUAIFENESIN 200 MG PO TABS: 400.0000 mg | ORAL_TABLET | ORAL | Status: AC | PRN

## 2011-07-08 NOTE — Discharge Instructions (Signed)
Uvulitis  Uvulitis is redness and soreness (inflammation) of the uvula. The uvula is the small tongue-shaped piece of tissue in the back of your mouth.   CAUSES  Infection is a common cause of uvulitis. Infection of the uvula can be either viral or bacterial. Infectious uvulitis usually only occurs in association with another condition, such as inflammation and infection of the mouth or throat.  Other causes of uvulitis include:   Trauma to the uvula.   Swelling from excess fluid buildup (edema), which may be an allergic reaction.   Inhalation of irritants, such as chemical agents, smoke, or steam.  DIAGNOSIS  Your caregiver can usually diagnose uvulitis through a physical examination. Bacterial uvulitis can be diagnosed through the results of the growth of samples of bodily substances taken from your mouth (cultures).  HOME CARE INSTRUCTIONS    Rest as much as possible.   Young children may suck on frozen juice bars or frozen ice pops. Older children and adults may gargle with a warm or cold liquid to help soothe the throat. (Mix  tsp of salt in 8 oz of water, or use strong tea.)   Use a cool-mist humidifier to lessen throat irritation and cough.   Drink enough fluids to keep your urine clear or pale yellow.   While the throat is very sore, eat soft or liquid foods such as milk, ice cream, soups, or milk drinks.   Family members who develop a sore throat or fever should have a medical exam or throat culture.   If your child has uvulitis and is taking antibiotic medicine, wait 24 hours or until his or her temperature is near normal (less than 100 F [37.8 C]) before allowing him or her to return to school or day care.   Only take over-the-counter or prescription medicines for pain, discomfort, or fever as directed by your caregiver.  Ask when your test results will be ready. Make sure you get your test results.  SEEK MEDICAL CARE IF:    You have an oral temperature above 102 F (38.9 C).   You  develop large, tender lumps your the neck.   Your child develops a rash.   You cough up green, yellow-brown, or bloody substances.  SEEK IMMEDIATE MEDICAL CARE IF:    You develop any new symptoms, such as vomiting, earache, severe headache, stiff neck, chest pain, or trouble breathing or swallowing.   Your airway is blocked.   You develop more severe throat pain along with drooling or voice changes.  Document Released: 10/06/2003 Document Revised: 02/14/2011 Document Reviewed: 05/03/2010  [employer] Patient Information 2012 [employer].

## 2011-07-08 NOTE — ED Provider Notes (Signed)
History     CSN: [id number]  Arrival date & time 07/08/11  1059   First MD Initiated Contact with Patient 07/08/11 1158      Chief Complaint  Patient presents with  . Sore Throat    (Consider location/radiation/quality/duration/timing/severity/associated sxs/prior treatment) HPI Pt seen last Monday and diagnosed with possible thyroid mass. Pt states that she has had nasal and chest congestion and is being threated by her PMD for seasonal allergies. Last night had increasing sense of swelling in her throat and became anxious. She took a benadryl and felt better. Today has sense of fullness in throat but is improved. No trouble swallowing or breathing. No fever chills, palpitations chest pain SOB Past Medical History  Diagnosis Date  . No pertinent past medical history     Past Surgical History  Procedure Date  . Umbilical hernia repair   . Mouth surgery     No family history on file.  History  Substance Use Topics  . Smoking status: Never Smoker   . Smokeless tobacco: Not on file  . Alcohol Use: No    OB History    Grav Para Term Preterm Abortions TAB SAB Ect Mult Living   3 3 3       3       Review of Systems  Constitutional: Positive for fever. Negative for chills.  HENT: Positive for congestion and sore throat. Negative for trouble swallowing and voice change.   Respiratory: Positive for cough. Negative for chest tightness, wheezing and stridor.   Cardiovascular: Negative for chest pain, palpitations and leg swelling.  Gastrointestinal: Negative for nausea, vomiting and abdominal pain.  Neurological: Negative for dizziness, weakness, numbness and headaches.    Allergies  Latex and Shellfish allergy  Home Medications   Current Outpatient Rx  Name Route Sig Dispense Refill  . BENADRYL ALLERGY PO Oral Take 5 mLs by mouth 2 (two) times daily.    Marland Kitchen PRENATAL PLUS 27-1 MG PO TABS Oral Take 1 tablet by mouth every morning.     . GUAIFENESIN 200 MG PO TABS Oral  Take 2 tablets (400 mg total) by mouth every 4 (four) hours as needed for congestion. 30 suppository 0  . PREDNISONE 10 MG PO TABS Oral Take 2 tablets (20 mg total) by mouth daily. 10 tablet 0    BP 124/69  Pulse 86  Temp(Src) 98.6 F (37 C) (Oral)  Resp 16  SpO2 96%  LMP 06/04/2011  Physical Exam  Nursing note and vitals reviewed. Constitutional: She is oriented to person, place, and time. She appears well-developed and well-nourished. No distress.  HENT:  Head: Normocephalic and atraumatic.       Mild uvular edema. Airway clear. No tonsillar, tongue, lip  Enlargement. Normal voice  Eyes: EOM are normal. Pupils are equal, round, and reactive to light.  Neck: Normal range of motion. Neck supple. No tracheal deviation present. Thyromegaly (No thyroid tenderness) present.  Cardiovascular: Normal rate and regular rhythm.   Pulmonary/Chest: Effort normal and breath sounds normal. No stridor. No respiratory distress. She has no wheezes. She has no rales. She exhibits no tenderness.  Abdominal: Soft. Bowel sounds are normal. There is no tenderness. There is no rebound and no guarding.  Musculoskeletal: Normal range of motion. She exhibits no edema and no tenderness.  Neurological: She is alert and oriented to person, place, and time.  Skin: Skin is warm and dry. No rash noted. No erythema.  Psychiatric: She has a normal mood and affect. Her  behavior is normal.    ED Course  Procedures (including critical care time)  Labs Reviewed - No data to display No results found.   1. Uvulitis   2. Thyromegaly       MDM  F/u with PMD for further thyroid workup. ? URI vs allergic reaction vs thyroid enlargement. Currently airway stable. Will treat with short course of prednisone and cont use of benadryl PRN        Loren Racer, MD 07/08/11 1237

## 2011-07-08 NOTE — ED Notes (Signed)
No diff breathing speaking ,  Feels it when she swollows

## 2011-07-08 NOTE — ED Notes (Signed)
States has been seen for the same throat pain has lost weight  Has seen her dr since being seen here but now  It feels like a lump in throat takes benadryl and feels better

## 2011-07-08 NOTE — ED Notes (Signed)
NO acute airway distress noted, minimal swelling present, patient resting quietly.

## 2011-07-16 ENCOUNTER — Telehealth: Payer: Self-pay | Admitting: Obstetrics and Gynecology

## 2011-07-17 NOTE — Telephone Encounter (Signed)
Lm on vm tcb rgd r/s colpo 

## 2011-07-18 ENCOUNTER — Other Ambulatory Visit: Payer: Self-pay | Admitting: Internal Medicine

## 2011-07-18 ENCOUNTER — Encounter: Payer: Self-pay | Admitting: Obstetrics and Gynecology

## 2011-07-18 DIAGNOSIS — E041 Nontoxic single thyroid nodule: Secondary | ICD-10-CM

## 2011-07-18 NOTE — Telephone Encounter (Signed)
Spoke with pt rgd r/s colpo pt has appt 08/08/11 at 11:00 pt voice understanding

## 2011-07-21 ENCOUNTER — Emergency Department (HOSPITAL_COMMUNITY)
Admission: EM | Admit: 2011-07-21 | Discharge: 2011-07-21 | Disposition: A | Discharge status: Home / Self Care | Payer: Medicaid Other | Attending: Emergency Medicine | Admitting: Emergency Medicine

## 2011-07-21 ENCOUNTER — Encounter (HOSPITAL_COMMUNITY): Payer: Self-pay | Admitting: Emergency Medicine

## 2011-07-21 DIAGNOSIS — J029 Acute pharyngitis, unspecified: Secondary | ICD-10-CM

## 2011-07-21 DIAGNOSIS — H6690 Otitis media, unspecified, unspecified ear: Secondary | ICD-10-CM | POA: Insufficient documentation

## 2011-07-21 DIAGNOSIS — R070 Pain in throat: Secondary | ICD-10-CM | POA: Insufficient documentation

## 2011-07-21 MED ORDER — AMOXICILLIN 500 MG PO CAPS: 500.0000 mg | ORAL_CAPSULE | Freq: Three times a day (TID) | ORAL | Status: AC

## 2011-07-21 NOTE — Discharge Instructions (Signed)
Start the amoxicillin today.  We will treat for otitis media and pharengitis today.  Follow up with [facility] this week.  Gargle with warm salt water.  Change tooth brushes.

## 2011-07-21 NOTE — ED Provider Notes (Signed)
History     CSN: [id number]  Arrival date & time 07/21/11  1819   First MD Initiated Contact with Patient 07/21/11 1827      Chief Complaint  Patient presents with  . Sore Throat    (Consider location/radiation/quality/duration/timing/severity/associated sxs/prior treatment) Patient is a 26 y.o. female presenting with pharyngitis. The history is provided by the patient. No language interpreter was used.  Sore Throat This is a new problem. The current episode started in the past 7 days. The problem occurs constantly. The problem has been unchanged. Associated symptoms include a sore throat and swollen glands. Pertinent negatives include no abdominal pain, chest pain, chills, coughing, nausea or vomiting. The symptoms are aggravated by swallowing. Treatments tried: cepacol. The treatment provided no relief.   Sore throat x4 days with a negative strep on Friday. Using cepacol drops with no relief.  States that the pain is worse and the swelling is worse today. Having  bilateral ear pain as well. No pmh  Past Medical History  Diagnosis Date  . No pertinent past medical history     Past Surgical History  Procedure Date  . Umbilical hernia repair   . Mouth surgery     History reviewed. No pertinent family history.  History  Substance Use Topics  . Smoking status: Never Smoker   . Smokeless tobacco: Not on file  . Alcohol Use: No    OB History    Grav Para Term Preterm Abortions TAB SAB Ect Mult Living   3 3 3       3       Review of Systems  Constitutional: Negative.  Negative for chills.  HENT: Positive for ear pain and sore throat. Negative for hearing loss, trouble swallowing, tinnitus and ear discharge.   Eyes: Negative.   Respiratory: Negative.  Negative for cough.   Cardiovascular: Negative.  Negative for chest pain.  Gastrointestinal: Negative.  Negative for nausea, vomiting and abdominal pain.  Neurological: Negative.   Psychiatric/Behavioral: Negative.   All  other systems reviewed and are negative.    Allergies  Latex and Shellfish allergy  Home Medications   Current Outpatient Rx  Name Route Sig Dispense Refill  . CETIRIZINE HCL 10 MG PO TABS Oral Take 10 mg by mouth daily.    Marland Kitchen PRENATAL PLUS 27-1 MG PO TABS Oral Take 1 tablet by mouth every morning.     Marland Kitchen PREDNISONE 10 MG PO TABS Oral Take 2 tablets (20 mg total) by mouth daily. 10 tablet 0    BP 113/71  Pulse 86  Temp(Src) 98.2 F (36.8 C) (Oral)  Resp 16  SpO2 100%  LMP 06/04/2011  Breastfeeding? Yes  Physical Exam  Nursing note and vitals reviewed. Constitutional: She is oriented to person, place, and time. She appears well-developed and well-nourished.  HENT:  Head: Normocephalic and atraumatic. No trismus in the jaw.  Right Ear: No drainage. Tympanic membrane is injected, erythematous and bulging.  Left Ear: Tympanic membrane is injected.  Mouth/Throat: Uvula is midline and mucous membranes are normal. Posterior oropharyngeal edema and posterior oropharyngeal erythema present. No oropharyngeal exudate or tonsillar abscesses.  Eyes: Conjunctivae and EOM are normal. Pupils are equal, round, and reactive to light.  Neck: Normal range of motion. Neck supple.  Cardiovascular: Normal rate.   Pulmonary/Chest: Effort normal.  Abdominal: Soft.  Musculoskeletal: Normal range of motion. She exhibits no edema and no tenderness.  Neurological: She is alert and oriented to person, place, and time. She has normal reflexes.  Skin: Skin is warm and dry.  Psychiatric: She has a normal mood and affect.    ED Course  Procedures (including critical care time)  Labs Reviewed - No data to display No results found.   No diagnosis found.    MDM  Sore throat x 4 days with R otitis media.  Treated with rx for amoxicillin. No fever. Negative strep at pcp on Friday.  Follow up with pcp if not better.         Remi Haggard, NP 07/22/11 1139

## 2011-07-21 NOTE — ED Notes (Signed)
Pt had sore throat on Friday, Friday pt went to PMD, strep was negative.  Pt given cepacol drops which pt filled and lidacaine which pt did not fill.  Now pt is compaining of bilateral ear pain.

## 2011-07-22 NOTE — ED Provider Notes (Signed)
Medical screening examination/treatment/procedure(s) were performed by non-physician practitioner and as supervising physician I was immediately available for consultation/collaboration.   Travor Royce N Addisson Frate, MD 07/22/11 1639 

## 2011-07-23 ENCOUNTER — Ambulatory Visit
Admission: RE | Admit: 2011-07-23 | Discharge: 2011-07-23 | Disposition: A | Discharge status: Home / Self Care | Payer: Medicaid Other | Source: Ambulatory Visit | Attending: Internal Medicine | Admitting: Internal Medicine

## 2011-07-23 DIAGNOSIS — E041 Nontoxic single thyroid nodule: Secondary | ICD-10-CM

## 2011-08-06 ENCOUNTER — Telehealth: Payer: Self-pay | Admitting: Obstetrics and Gynecology

## 2011-08-06 NOTE — Telephone Encounter (Signed)
Triage,epic 

## 2011-08-06 NOTE — Telephone Encounter (Signed)
Lm on vm tcb rgd msg 

## 2011-08-06 NOTE — Telephone Encounter (Signed)
ND PT 

## 2011-08-08 ENCOUNTER — Encounter: Payer: Self-pay | Admitting: Obstetrics and Gynecology

## 2011-08-08 ENCOUNTER — Ambulatory Visit (INDEPENDENT_AMBULATORY_CARE_PROVIDER_SITE_OTHER): Payer: Medicaid Other | Admitting: Obstetrics and Gynecology

## 2011-08-08 VITALS — BP 118/78 | Resp 16 | Ht 61.5 in | Wt 208.0 lb

## 2011-08-08 DIAGNOSIS — R87610 Atypical squamous cells of undetermined significance on cytologic smear of cervix (ASC-US): Secondary | ICD-10-CM

## 2011-08-08 DIAGNOSIS — R87619 Unspecified abnormal cytological findings in specimens from cervix uteri: Secondary | ICD-10-CM | POA: Insufficient documentation

## 2011-08-08 DIAGNOSIS — N898 Other specified noninflammatory disorders of vagina: Secondary | ICD-10-CM

## 2011-08-08 DIAGNOSIS — N939 Abnormal uterine and vaginal bleeding, unspecified: Secondary | ICD-10-CM

## 2011-08-08 DIAGNOSIS — IMO0002 Reserved for concepts with insufficient information to code with codable children: Secondary | ICD-10-CM | POA: Insufficient documentation

## 2011-08-08 DIAGNOSIS — R87612 Low grade squamous intraepithelial lesion on cytologic smear of cervix (LGSIL): Secondary | ICD-10-CM

## 2011-08-08 HISTORY — DX: Reserved for concepts with insufficient information to code with codable children: IMO0002

## 2011-08-08 NOTE — Patient Instructions (Signed)
Colposcopy Colposcopy is a procedure that uses a special lighted microscope (colposcope). It examines your cervix and vagina, or the area around the outside of the vagina, for signs of disease or abnormalities in the cells. You may be sent to a specialist (gynecologist) to do the colposcopy. A biopsy (tissue sample) may be collected during a colposcopy, if the caregiver finds any unusual cells. The biopsy is sent to the lab for further testing, and the results are reported back to your caregiver. A WOMAN MAY NEED THIS PROCEDURE IF:  She has had an abnormal pap smear (taking cells from the cervix for testing).   She has a sore on her cervix, and a Pap test was normal.   The Pap test suggests human papilloma virus (HPV). This virus can cause genital warts and is linked to the development of cervical cancer.   She has genital warts on the cervix, or in or around the outside of the vagina.   Her mother took the drug DES while pregnant.   She has painful intercourse.   She has vaginal bleeding, especially after sexual intercourse.   There is a need to evaluate the results of previous treatment.  BEFORE THE PROCEDURE   Colposcopy is done when you are not having a menstrual period.   For 24 hours before the colposcopy, do not:   Douche.   Use tampons.   Use medicines, creams, or suppositories in the vagina.   Have sexual intercourse.  PROCEDURE   A colposcopy is done while a woman is lying on her back with her feet in foot rests (stirrups).   A speculum is placed inside the vagina to keep it open and to allow the caregiver to see the cervix. This is the same instrument used to do a pap smear.   The colposcope is placed outside the vagina. It is used to magnify and examine the cervix, vagina, and the area around the outside of the vagina.   A small amount of liquid solution is placed on the area that is to be viewed. This solution is placed on with a cotton applicator. This solution  makes it easier to see the abnormal cells.   Your caregiver will suck out mucus and cells from the canal of the cervix.   Small pieces of tissue for biopsy may be taken at the same time. You may feel mild pain or discomfort when this is done.   Your caregiver will record the location of the abnormal areas and send the tissue samples to a lab for analysis.   If your caregiver biopsies the vagina or outside of the vagina, a local anesthetic (novocaine) is usually given.  AFTER THE PROCEDURE   You may have some cramping that often goes away in a few minutes. You may have some soreness for a couple of days.   You may take over-the-counter pain medicine as advised by your caregiver. Do not take aspirin because it can cause bleeding.   Lie down for a few minutes if you feel lightheaded.   You may have some bleeding or dark discharge that should stop in a few days.   You may need to wear a sanitary pad for a few days.  HOME CARE INSTRUCTIONS   Avoid sex, douching, and using tampons for a week or as directed.   Only take medicine as directed by your caregiver.   Continue to take birth control pills, if you are on them.   Not all test results are   available during your visit. If your test results are not back during the visit, make an appointment with your caregiver to find out the results. Do not assume everything is normal if you have not heard from your caregiver or the medical facility. It is important for you to follow up on all of your test results.   Follow your caregiver's advice regarding medicines, activity, follow-up visits, and follow-up Pap tests.  SEEK MEDICAL CARE IF:   You develop a rash.   You have problems with your medicine.  SEEK IMMEDIATE MEDICAL CARE IF:  You are bleeding heavily or are passing blood clots.   You develop a fever over 102 F (38.9 C), with or without chills.   You have abnormal vaginal discharge.   You are having cramps that do not go away  after taking your pain medicine.   You feel lightheaded, dizzy, or faint.   You develop stomach pain.  Document Released: 05/18/2002 Document Revised: 02/14/2011 Document Reviewed: 12/29/2008 [employer] Patient Information 2012 [employer]. 

## 2011-08-08 NOTE — Progress Notes (Signed)
DATE OF SURGERY: 08/08/2011 TYPE OF SURGERY: Colposcopy PAIN:No VAG BLEEDING: yes VAG DISCHARGE: no NORMAL GI FUNCTN: yes NORMAL GU FUNCTN: yes  Last pap- 05/2010/LSIL Pt also c/o of post coital spotting .colpo:  Aw changes and mosacisim seen .  bx at 12 oclock ecc done Wet prep negative Gc/chlam done secondary to postcoital bleeding Rt 6 months for annual and pap

## 2011-08-09 LAB — GC/CHLAMYDIA PROBE AMP, GENITAL: Chlamydia, DNA Probe: POSITIVE — AB

## 2011-08-12 NOTE — Telephone Encounter (Signed)
Lm on vm tcb rgd msg 

## 2011-08-13 ENCOUNTER — Other Ambulatory Visit (INDEPENDENT_AMBULATORY_CARE_PROVIDER_SITE_OTHER): Payer: Medicaid Other

## 2011-08-13 ENCOUNTER — Telehealth: Payer: Self-pay

## 2011-08-13 DIAGNOSIS — A5400 Gonococcal infection of lower genitourinary tract, unspecified: Secondary | ICD-10-CM

## 2011-08-13 MED ORDER — AZITHROMYCIN 250 MG PO TABS: ORAL_TABLET | ORAL | Status: DC

## 2011-08-13 MED ORDER — CEFTRIAXONE SODIUM 1 G IJ SOLR
250.0000 mg | INTRAMUSCULAR | Status: DC
  Administered 2011-08-13: 250 mg via INTRAMUSCULAR

## 2011-08-13 MED ORDER — CEFTRIAXONE SODIUM 1 G IJ SOLR
250.0000 mg | Freq: Once | INTRAMUSCULAR | Status: AC
  Administered 2011-08-13: 250 mg via INTRAMUSCULAR

## 2011-08-13 NOTE — Telephone Encounter (Signed)
Message copied by Rolla Plate on Tue Aug 13, 2011 11:28 AM ------      Message from: Jaymes Graff      Created: Mon Aug 12, 2011 10:47 PM       Please review colpo results with patient and tell her I recommend a pap every six months for the next year.      Also please treat pt with zithromax 250mg  4 tablets po times one and rocephin 250mg  IM times one.  RT to the office in 3 weeks for test of cure.  May treat her partner if we know his allergies.  Thanks

## 2011-08-13 NOTE — Telephone Encounter (Signed)
Spoke with pt rgs labs informed colpo consistent with pap repeat pap every six months also gc/ct positive std protocol reviewed with pt pt has appt today at 2:15 for rocephin injection rx sent to pharm and toc 09/04/11 at 1:00 with ND pt voice understanding

## 2011-08-14 NOTE — Telephone Encounter (Signed)
Lm on vm tcb rgd msg 

## 2011-08-14 NOTE — Telephone Encounter (Signed)
niccole/epic °

## 2011-09-04 ENCOUNTER — Encounter: Payer: Medicaid Other | Admitting: Obstetrics and Gynecology

## 2011-09-27 ENCOUNTER — Encounter: Payer: Medicaid Other | Admitting: Obstetrics and Gynecology

## 2012-11-01 ENCOUNTER — Encounter (HOSPITAL_BASED_OUTPATIENT_CLINIC_OR_DEPARTMENT_OTHER): Payer: Self-pay | Admitting: *Deleted

## 2012-11-01 ENCOUNTER — Emergency Department (HOSPITAL_BASED_OUTPATIENT_CLINIC_OR_DEPARTMENT_OTHER)
Admission: EM | Admit: 2012-11-01 | Discharge: 2012-11-01 | Disposition: A | Discharge status: Home / Self Care | Payer: Self-pay | Attending: Emergency Medicine | Admitting: Emergency Medicine

## 2012-11-01 DIAGNOSIS — Z9889 Other specified postprocedural states: Secondary | ICD-10-CM | POA: Insufficient documentation

## 2012-11-01 DIAGNOSIS — N898 Other specified noninflammatory disorders of vagina: Secondary | ICD-10-CM | POA: Insufficient documentation

## 2012-11-01 DIAGNOSIS — Z3202 Encounter for pregnancy test, result negative: Secondary | ICD-10-CM | POA: Insufficient documentation

## 2012-11-01 DIAGNOSIS — Z8614 Personal history of Methicillin resistant Staphylococcus aureus infection: Secondary | ICD-10-CM | POA: Insufficient documentation

## 2012-11-01 DIAGNOSIS — E669 Obesity, unspecified: Secondary | ICD-10-CM | POA: Insufficient documentation

## 2012-11-01 DIAGNOSIS — Z91040 Latex allergy status: Secondary | ICD-10-CM | POA: Insufficient documentation

## 2012-11-01 DIAGNOSIS — Z8619 Personal history of other infectious and parasitic diseases: Secondary | ICD-10-CM | POA: Insufficient documentation

## 2012-11-01 DIAGNOSIS — A499 Bacterial infection, unspecified: Secondary | ICD-10-CM | POA: Insufficient documentation

## 2012-11-01 DIAGNOSIS — R300 Dysuria: Secondary | ICD-10-CM | POA: Insufficient documentation

## 2012-11-01 DIAGNOSIS — N760 Acute vaginitis: Secondary | ICD-10-CM | POA: Insufficient documentation

## 2012-11-01 DIAGNOSIS — R110 Nausea: Secondary | ICD-10-CM | POA: Insufficient documentation

## 2012-11-01 DIAGNOSIS — Z862 Personal history of diseases of the blood and blood-forming organs and certain disorders involving the immune mechanism: Secondary | ICD-10-CM | POA: Insufficient documentation

## 2012-11-01 DIAGNOSIS — B9689 Other specified bacterial agents as the cause of diseases classified elsewhere: Secondary | ICD-10-CM | POA: Insufficient documentation

## 2012-11-01 DIAGNOSIS — IMO0002 Reserved for concepts with insufficient information to code with codable children: Secondary | ICD-10-CM | POA: Insufficient documentation

## 2012-11-01 DIAGNOSIS — Z8742 Personal history of other diseases of the female genital tract: Secondary | ICD-10-CM | POA: Insufficient documentation

## 2012-11-01 DIAGNOSIS — Z8659 Personal history of other mental and behavioral disorders: Secondary | ICD-10-CM | POA: Insufficient documentation

## 2012-11-01 DIAGNOSIS — Z79899 Other long term (current) drug therapy: Secondary | ICD-10-CM | POA: Insufficient documentation

## 2012-11-01 LAB — URINALYSIS, ROUTINE W REFLEX MICROSCOPIC
Bilirubin Urine: NEGATIVE
Glucose, UA: NEGATIVE mg/dL
Hgb urine dipstick: NEGATIVE
Ketones, ur: NEGATIVE mg/dL
Nitrite: NEGATIVE
Protein, ur: NEGATIVE mg/dL
Specific Gravity, Urine: 1.025 (ref 1.005–1.030)
Urobilinogen, UA: 1 mg/dL (ref 0.0–1.0)
pH: 6.5 (ref 5.0–8.0)

## 2012-11-01 LAB — URINE MICROSCOPIC-ADD ON

## 2012-11-01 LAB — PREGNANCY, URINE: Preg Test, Ur: NEGATIVE

## 2012-11-01 LAB — WET PREP, GENITAL

## 2012-11-01 MED ORDER — CEFTRIAXONE SODIUM 250 MG IJ SOLR
250.0000 mg | INTRAMUSCULAR | Status: DC
  Administered 2012-11-01: 250 mg via INTRAMUSCULAR
  Filled 2012-11-01: qty 250

## 2012-11-01 MED ORDER — LIDOCAINE HCL (PF) 1 % IJ SOLN
INTRAMUSCULAR | Status: AC
  Administered 2012-11-01: 2.1 mL
  Filled 2012-11-01: qty 5

## 2012-11-01 MED ORDER — METRONIDAZOLE 500 MG PO TABS: 500.0000 mg | ORAL_TABLET | Freq: Two times a day (BID) | ORAL | Status: DC

## 2012-11-01 MED ORDER — AZITHROMYCIN 250 MG PO TABS
1000.0000 mg | ORAL_TABLET | Freq: Once | ORAL | Status: AC
  Administered 2012-11-01: 1000 mg via ORAL
  Filled 2012-11-01: qty 4

## 2012-11-01 NOTE — ED Notes (Signed)
Patient with abdominal pain for about a month.  Patient states that it feels like she has a UTI.
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
## 2012-11-03 LAB — GC/CHLAMYDIA PROBE AMP: GC Probe RNA: NEGATIVE

## 2012-11-03 LAB — URINE CULTURE

## 2012-11-04 NOTE — ED Notes (Addendum)
+  Chlamydia. Patient treated with Zithromax. [employer] faxed. 

## 2012-11-05 ENCOUNTER — Telehealth (HOSPITAL_COMMUNITY): Payer: Self-pay | Admitting: *Deleted

## 2012-11-05 NOTE — ED Provider Notes (Signed)
Medical screening examination/treatment/procedure(s) were performed by non-physician practitioner and as supervising physician I was immediately available for consultation/collaboration.  Jearlean Demauro, MD 11/05/12 0720 

## 2012-11-05 NOTE — ED Notes (Signed)
Patient informed of positive results after id'd x 2 and informed of need to notify partner to be treated. 

## 2013-02-24 ENCOUNTER — Encounter (HOSPITAL_COMMUNITY): Payer: Self-pay

## 2013-02-24 ENCOUNTER — Inpatient Hospital Stay (HOSPITAL_COMMUNITY)
Admission: AD | Admit: 2013-02-24 | Discharge: 2013-02-24 | Disposition: A | Discharge status: Home / Self Care | Payer: Medicaid Other | Source: Ambulatory Visit | Attending: Obstetrics and Gynecology | Admitting: Obstetrics and Gynecology

## 2013-02-24 DIAGNOSIS — R197 Diarrhea, unspecified: Secondary | ICD-10-CM | POA: Insufficient documentation

## 2013-02-24 DIAGNOSIS — J029 Acute pharyngitis, unspecified: Secondary | ICD-10-CM | POA: Insufficient documentation

## 2013-02-24 DIAGNOSIS — O219 Vomiting of pregnancy, unspecified: Secondary | ICD-10-CM

## 2013-02-24 DIAGNOSIS — J111 Influenza due to unidentified influenza virus with other respiratory manifestations: Secondary | ICD-10-CM

## 2013-02-24 DIAGNOSIS — J069 Acute upper respiratory infection, unspecified: Secondary | ICD-10-CM | POA: Insufficient documentation

## 2013-02-24 DIAGNOSIS — O210 Mild hyperemesis gravidarum: Secondary | ICD-10-CM | POA: Insufficient documentation

## 2013-02-24 DIAGNOSIS — O99891 Other specified diseases and conditions complicating pregnancy: Secondary | ICD-10-CM | POA: Insufficient documentation

## 2013-02-24 HISTORY — DX: Other complications of anesthesia, initial encounter: T88.59XA

## 2013-02-24 LAB — CBC
Hemoglobin: 9.1 g/dL — ABNORMAL LOW (ref 12.0–15.0)
MCHC: 29.9 g/dL — ABNORMAL LOW (ref 30.0–36.0)
Platelets: 312 10*3/uL (ref 150–400)
RDW: 19.5 % — ABNORMAL HIGH (ref 11.5–15.5)

## 2013-02-24 LAB — URINALYSIS, ROUTINE W REFLEX MICROSCOPIC
Bilirubin Urine: NEGATIVE
Glucose, UA: NEGATIVE mg/dL
Hgb urine dipstick: NEGATIVE
Ketones, ur: NEGATIVE mg/dL
Leukocytes, UA: NEGATIVE
Nitrite: NEGATIVE
Protein, ur: NEGATIVE mg/dL
Specific Gravity, Urine: 1.02 (ref 1.005–1.030)
Urobilinogen, UA: 0.2 mg/dL (ref 0.0–1.0)
pH: 8 (ref 5.0–8.0)

## 2013-02-24 LAB — POCT PREGNANCY, URINE: Preg Test, Ur: POSITIVE — AB

## 2013-02-24 MED ORDER — OSELTAMIVIR PHOSPHATE 75 MG PO CAPS: 75.0000 mg | ORAL_CAPSULE | Freq: Two times a day (BID) | ORAL | Status: DC

## 2013-02-24 MED ORDER — ACETAMINOPHEN 325 MG PO TABS
650.0000 mg | ORAL_TABLET | Freq: Once | ORAL | Status: AC
  Administered 2013-02-24: 650 mg via ORAL
  Filled 2013-02-24: qty 2

## 2013-02-24 MED ORDER — PROMETHAZINE HCL 25 MG PO TABS: 25.0000 mg | ORAL_TABLET | Freq: Four times a day (QID) | ORAL | Status: DC | PRN

## 2013-02-24 MED ORDER — PROMETHAZINE HCL 25 MG PO TABS
25.0000 mg | ORAL_TABLET | Freq: Once | ORAL | Status: AC
  Administered 2013-02-24: 25 mg via ORAL
  Filled 2013-02-24: qty 1
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
## 2013-02-24 NOTE — MAU Note (Signed)
States her 26 year old and husband have both been sick then her 61 year old was sick as well and the teacher called and said 2 children in class had scarlet fever.  Pt reports she has been sick since Sunday, had nausea & vomiting & diarrhea on Monday and had fever 100-101 yesterday. States she feels a little better today and was able to eat today- ate grits and toast this morning and french fries earlier.  States she has had some vomiting today and a sore throat.

## 2013-02-24 NOTE — MAU Note (Signed)
Positive pregnancy test at [facility]. Given due date of 8/1. Vomiting since Sunday, able to keep "some" things down today, vomited x 4 today. Both children have been sick and pt had fever of 101 yesterday.

## 2013-02-25 LAB — INFLUENZA PANEL BY PCR (TYPE A & B)
Influenza A By PCR: NEGATIVE
Influenza B By PCR: NEGATIVE

## 2013-02-26 NOTE — MAU Provider Note (Signed)
Attestation of Attending Supervision of Advanced Practitioner: Evaluation and management procedures were performed by the PA/NP/CNM/OB Fellow under my supervision/collaboration. Chart reviewed and agree with management and plan.  [name] 02/26/2013 8:30 PM

## 2013-03-11 NOTE — L&D Delivery Note (Signed)
I have seen and examined this patient and I agree with the above. Cam HaiSHAW, KIMBERLY CNM 8:37 AM 10/02/2013

## 2013-03-11 NOTE — L&D Delivery Note (Signed)
Delivery Note At 6:23 AM a viable female was delivered via Vaginal, Spontaneous Delivery (Presentation:Right ; Occiput Anterior).  APGAR:8 ,9 ; weight pending .   Placenta status: Intact, Spontaneous.  Cord: 3 vessels with the following complications: None.    Anesthesia: Epidural  Episiotomy: None Lacerations: None Suture Repair: None Est. Blood Loss (mL): 250cc  Mom to postpartum.  Baby to Couplet care / Skin to Skin.  Called to delivery. Mother pushed over 2 min. Infant delivered to maternal abdomen. Delayed cord clamping performed.  Cord clamped and cut. Active management of 3rd stage with traction and Pitocin. Placenta delivered intact with 3v cord. No tears. EBL250cc. Counts correct. Hemostatic.   Amanda Church, Amanda Church 10/02/2013, 6:35 AM

## 2013-03-22 LAB — OB RESULTS CONSOLE HEPATITIS B SURFACE ANTIGEN: Hepatitis B Surface Ag: NEGATIVE

## 2013-03-22 LAB — OB RESULTS CONSOLE GC/CHLAMYDIA
CHLAMYDIA, DNA PROBE: NEGATIVE
GC PROBE AMP, GENITAL: NEGATIVE

## 2013-03-22 LAB — OB RESULTS CONSOLE ABO/RH: RH Type: POSITIVE

## 2013-03-22 LAB — OB RESULTS CONSOLE HIV ANTIBODY (ROUTINE TESTING): HIV: NONREACTIVE

## 2013-03-22 LAB — OB RESULTS CONSOLE ANTIBODY SCREEN: Antibody Screen: NEGATIVE

## 2013-03-22 LAB — OB RESULTS CONSOLE RUBELLA ANTIBODY, IGM: Rubella: IMMUNE

## 2013-03-22 LAB — OB RESULTS CONSOLE RPR: RPR: NONREACTIVE

## 2013-07-18 ENCOUNTER — Encounter (HOSPITAL_COMMUNITY): Payer: Self-pay | Admitting: *Deleted

## 2013-07-18 ENCOUNTER — Inpatient Hospital Stay (HOSPITAL_COMMUNITY)
Admission: AD | Admit: 2013-07-18 | Discharge: 2013-07-18 | Disposition: A | Discharge status: Home / Self Care | Payer: Medicaid Other | Source: Ambulatory Visit | Attending: Obstetrics & Gynecology | Admitting: Obstetrics & Gynecology

## 2013-07-18 DIAGNOSIS — O9989 Other specified diseases and conditions complicating pregnancy, childbirth and the puerperium: Principal | ICD-10-CM

## 2013-07-18 DIAGNOSIS — O99891 Other specified diseases and conditions complicating pregnancy: Secondary | ICD-10-CM | POA: Insufficient documentation

## 2013-07-18 DIAGNOSIS — O26899 Other specified pregnancy related conditions, unspecified trimester: Secondary | ICD-10-CM

## 2013-07-18 DIAGNOSIS — N898 Other specified noninflammatory disorders of vagina: Secondary | ICD-10-CM | POA: Insufficient documentation

## 2013-07-18 LAB — WET PREP, GENITAL
Clue Cells Wet Prep HPF POC: NONE SEEN
TRICH WET PREP: NONE SEEN
Yeast Wet Prep HPF POC: NONE SEEN

## 2013-07-18 LAB — URINALYSIS, ROUTINE W REFLEX MICROSCOPIC
Bilirubin Urine: NEGATIVE
Glucose, UA: NEGATIVE mg/dL
Hgb urine dipstick: NEGATIVE
Ketones, ur: 15 mg/dL — AB
NITRITE: NEGATIVE
PH: 6 (ref 5.0–8.0)
Protein, ur: NEGATIVE mg/dL
Urobilinogen, UA: 0.2 mg/dL (ref 0.0–1.0)

## 2013-07-18 LAB — URINE MICROSCOPIC-ADD ON

## 2013-07-18 LAB — AMNISURE RUPTURE OF MEMBRANE (ROM) NOT AT ARMC: AMNISURE: NEGATIVE

## 2013-07-18 NOTE — MAU Note (Signed)
Pt presents to MAU with complaints of leakage of fluid. She states that she noticed it this morning after taking a shower by her underwear staying wet. Continues to have the leakage when moving around.
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
## 2013-07-18 NOTE — MAU Note (Signed)
C/o ? PROM @ 0945 this AM;

## 2013-09-21 LAB — OB RESULTS CONSOLE HGB/HCT, BLOOD: HCT: 30 %

## 2013-09-21 LAB — OB RESULTS CONSOLE GBS: STREP GROUP B AG: NEGATIVE

## 2013-10-01 ENCOUNTER — Encounter (HOSPITAL_COMMUNITY): Payer: Self-pay

## 2013-10-01 ENCOUNTER — Inpatient Hospital Stay (HOSPITAL_COMMUNITY)
Admission: AD | Admit: 2013-10-01 | Discharge: 2013-10-01 | Disposition: A | Discharge status: Home / Self Care | Payer: Medicaid Other | Source: Ambulatory Visit | Attending: Obstetrics and Gynecology | Admitting: Obstetrics and Gynecology

## 2013-10-01 ENCOUNTER — Encounter (HOSPITAL_COMMUNITY): Payer: Self-pay | Admitting: *Deleted

## 2013-10-01 ENCOUNTER — Inpatient Hospital Stay (HOSPITAL_COMMUNITY)
Admission: AD | Admit: 2013-10-01 | Discharge: 2013-10-04 | DRG: 775 | Disposition: A | Discharge status: Home / Self Care | Payer: Medicaid Other | Source: Ambulatory Visit | Attending: Obstetrics and Gynecology | Admitting: Obstetrics and Gynecology

## 2013-10-01 DIAGNOSIS — O479 False labor, unspecified: Secondary | ICD-10-CM | POA: Diagnosis not present

## 2013-10-01 DIAGNOSIS — O99344 Other mental disorders complicating childbirth: Secondary | ICD-10-CM | POA: Diagnosis present

## 2013-10-01 DIAGNOSIS — O99334 Smoking (tobacco) complicating childbirth: Secondary | ICD-10-CM | POA: Diagnosis present

## 2013-10-01 DIAGNOSIS — Z833 Family history of diabetes mellitus: Secondary | ICD-10-CM

## 2013-10-01 DIAGNOSIS — F329 Major depressive disorder, single episode, unspecified: Secondary | ICD-10-CM | POA: Diagnosis present

## 2013-10-01 DIAGNOSIS — O4290 Premature rupture of membranes, unspecified as to length of time between rupture and onset of labor, unspecified weeks of gestation: Secondary | ICD-10-CM | POA: Diagnosis present

## 2013-10-01 DIAGNOSIS — O9902 Anemia complicating childbirth: Secondary | ICD-10-CM | POA: Diagnosis present

## 2013-10-01 DIAGNOSIS — O4292 Full-term premature rupture of membranes, unspecified as to length of time between rupture and onset of labor: Secondary | ICD-10-CM

## 2013-10-01 DIAGNOSIS — Z823 Family history of stroke: Secondary | ICD-10-CM

## 2013-10-01 DIAGNOSIS — D649 Anemia, unspecified: Secondary | ICD-10-CM | POA: Diagnosis present

## 2013-10-01 DIAGNOSIS — F3289 Other specified depressive episodes: Secondary | ICD-10-CM | POA: Diagnosis present

## 2013-10-01 DIAGNOSIS — Z8614 Personal history of Methicillin resistant Staphylococcus aureus infection: Secondary | ICD-10-CM

## 2013-10-01 DIAGNOSIS — Z825 Family history of asthma and other chronic lower respiratory diseases: Secondary | ICD-10-CM

## 2013-10-01 DIAGNOSIS — IMO0002 Reserved for concepts with insufficient information to code with codable children: Secondary | ICD-10-CM

## 2013-10-01 LAB — POCT FERN TEST: POCT FERN TEST: POSITIVE

## 2013-10-01 NOTE — MAU Note (Signed)
Pt just left MAU. Almost home and felt a "pop" and leaked a lot of fld. Contractions stronger

## 2013-10-01 NOTE — MAU Note (Signed)
PT SAYS   SHE WENT TO [facility]  WITH OTHER BABIES-  BUT WENT TO [place]  WITH THIS BABY-  DR ELLIS.  WAS THERE ON WED - VE  -  3 CM.     STARTED HAVING UC AT    6PM..    NOTICED AT 530- TOOK SHOWER - THEN STARTED HAVING UC- AND SAW FLUID THEN WENT WALKING -  PANTS AND CHAIR WAS WET.      DENIES HSV AND MRSA.   GBS- UNSURE .   LAST SEX-   June.

## 2013-10-02 ENCOUNTER — Encounter (HOSPITAL_COMMUNITY): Payer: Medicaid Other | Admitting: Anesthesiology

## 2013-10-02 ENCOUNTER — Encounter (HOSPITAL_COMMUNITY): Payer: Self-pay | Admitting: *Deleted

## 2013-10-02 ENCOUNTER — Inpatient Hospital Stay (HOSPITAL_COMMUNITY): Payer: Medicaid Other | Admitting: Anesthesiology

## 2013-10-02 DIAGNOSIS — D649 Anemia, unspecified: Secondary | ICD-10-CM

## 2013-10-02 DIAGNOSIS — O99344 Other mental disorders complicating childbirth: Secondary | ICD-10-CM

## 2013-10-02 DIAGNOSIS — F3289 Other specified depressive episodes: Secondary | ICD-10-CM | POA: Diagnosis present

## 2013-10-02 DIAGNOSIS — IMO0002 Reserved for concepts with insufficient information to code with codable children: Secondary | ICD-10-CM | POA: Diagnosis not present

## 2013-10-02 DIAGNOSIS — Z8614 Personal history of Methicillin resistant Staphylococcus aureus infection: Secondary | ICD-10-CM | POA: Diagnosis not present

## 2013-10-02 DIAGNOSIS — O4290 Premature rupture of membranes, unspecified as to length of time between rupture and onset of labor, unspecified weeks of gestation: Secondary | ICD-10-CM | POA: Diagnosis present

## 2013-10-02 DIAGNOSIS — O9902 Anemia complicating childbirth: Secondary | ICD-10-CM | POA: Diagnosis present

## 2013-10-02 DIAGNOSIS — Z833 Family history of diabetes mellitus: Secondary | ICD-10-CM | POA: Diagnosis not present

## 2013-10-02 DIAGNOSIS — F329 Major depressive disorder, single episode, unspecified: Secondary | ICD-10-CM | POA: Diagnosis present

## 2013-10-02 DIAGNOSIS — Z823 Family history of stroke: Secondary | ICD-10-CM | POA: Diagnosis not present

## 2013-10-02 DIAGNOSIS — Z825 Family history of asthma and other chronic lower respiratory diseases: Secondary | ICD-10-CM | POA: Diagnosis not present

## 2013-10-02 DIAGNOSIS — O99334 Smoking (tobacco) complicating childbirth: Secondary | ICD-10-CM | POA: Diagnosis present

## 2013-10-02 HISTORY — DX: Premature rupture of membranes, unspecified as to length of time between rupture and onset of labor, unspecified weeks of gestation: O42.90

## 2013-10-02 LAB — BASIC METABOLIC PANEL
Anion gap: 13 (ref 5–15)
BUN: 7 mg/dL (ref 6–23)
CO2: 22 meq/L (ref 19–32)
CREATININE: 0.5 mg/dL (ref 0.50–1.10)
Calcium: 9.5 mg/dL (ref 8.4–10.5)
Chloride: 100 mEq/L (ref 96–112)
GFR calc Af Amer: >90 mL/min (ref >90)
Glucose, Bld: 80 mg/dL (ref 70–99)
Potassium: 4.6 mEq/L (ref 3.7–5.3)
SODIUM: 135 meq/L — AB (ref 137–147)

## 2013-10-02 LAB — TYPE AND SCREEN
ABO/RH(D): A POS
Antibody Screen: NEGATIVE

## 2013-10-02 LAB — CBC
HEMATOCRIT: 32.3 % — AB (ref 36.0–46.0)
HEMOGLOBIN: 10.7 g/dL — AB (ref 12.0–15.0)
MCH: 27 pg (ref 26.0–34.0)
MCHC: 33.1 g/dL (ref 30.0–36.0)
MCV: 81.4 fL (ref 78.0–100.0)
Platelets: 223 10*3/uL (ref 150–400)
RBC: 3.97 MIL/uL (ref 3.87–5.11)
RDW: 17.9 % — AB (ref 11.5–15.5)
WBC: 7.4 10*3/uL (ref 4.0–10.5)

## 2013-10-02 LAB — RPR

## 2013-10-02 MED ORDER — PRENATAL MULTIVITAMIN CH
1.0000 | ORAL_TABLET | Freq: Every day | ORAL | Status: DC
  Administered 2013-10-02 – 2013-10-04 (×3): 1 via ORAL
  Filled 2013-10-02 (×3): qty 1

## 2013-10-02 MED ORDER — LACTATED RINGERS IV SOLN: INTRAVENOUS | Status: DC

## 2013-10-02 MED ORDER — FENTANYL 2.5 MCG/ML BUPIVACAINE 1/10 % EPIDURAL INFUSION (WH - ANES)
INTRAMUSCULAR | Status: DC | PRN
  Administered 2013-10-02: 14 mL/h via EPIDURAL

## 2013-10-02 MED ORDER — DIPHENHYDRAMINE HCL 50 MG/ML IJ SOLN: 12.5000 mg | INTRAMUSCULAR | Status: DC | PRN

## 2013-10-02 MED ORDER — TETANUS-DIPHTH-ACELL PERTUSSIS 5-2.5-18.5 LF-MCG/0.5 IM SUSP
0.5000 mL | Freq: Once | INTRAMUSCULAR | Status: AC
  Administered 2013-10-03: 0.5 mL via INTRAMUSCULAR
  Filled 2013-10-02: qty 0.5

## 2013-10-02 MED ORDER — LANOLIN HYDROUS EX OINT: TOPICAL_OINTMENT | CUTANEOUS | Status: DC | PRN

## 2013-10-02 MED ORDER — WITCH HAZEL-GLYCERIN EX PADS: 1.0000 "application " | MEDICATED_PAD | CUTANEOUS | Status: DC | PRN

## 2013-10-02 MED ORDER — LACTATED RINGERS IV SOLN: 500.0000 mL | INTRAVENOUS | Status: DC | PRN

## 2013-10-02 MED ORDER — MISOPROSTOL 50MCG HALF TABLET
50.0000 ug | ORAL_TABLET | ORAL | Status: DC | PRN
  Administered 2013-10-02: 50 ug via ORAL
  Filled 2013-10-02: qty 1

## 2013-10-02 MED ORDER — IBUPROFEN 600 MG PO TABS: 600.0000 mg | ORAL_TABLET | Freq: Four times a day (QID) | ORAL | Status: DC | PRN

## 2013-10-02 MED ORDER — LACTATED RINGERS IV SOLN
500.0000 mL | Freq: Once | INTRAVENOUS | Status: AC
  Administered 2013-10-02: 500 mL via INTRAVENOUS

## 2013-10-02 MED ORDER — ONDANSETRON HCL 4 MG PO TABS: 4.0000 mg | ORAL_TABLET | ORAL | Status: DC | PRN

## 2013-10-02 MED ORDER — LIDOCAINE HCL (PF) 1 % IJ SOLN
INTRAMUSCULAR | Status: DC | PRN
  Administered 2013-10-02: 5 mL
  Administered 2013-10-02: 3 mL
  Administered 2013-10-02: 5 mL

## 2013-10-02 MED ORDER — OXYCODONE-ACETAMINOPHEN 5-325 MG PO TABS
1.0000 | ORAL_TABLET | ORAL | Status: DC | PRN
  Administered 2013-10-02 – 2013-10-04 (×10): 1 via ORAL
  Filled 2013-10-02 (×11): qty 1

## 2013-10-02 MED ORDER — OXYTOCIN BOLUS FROM INFUSION
500.0000 mL | INTRAVENOUS | Status: DC
  Administered 2013-10-02: 500 mL via INTRAVENOUS

## 2013-10-02 MED ORDER — SIMETHICONE 80 MG PO CHEW
80.0000 mg | CHEWABLE_TABLET | ORAL | Status: DC | PRN
  Administered 2013-10-03 (×2): 80 mg via ORAL
  Filled 2013-10-02 (×2): qty 1

## 2013-10-02 MED ORDER — ZOLPIDEM TARTRATE 5 MG PO TABS
5.0000 mg | ORAL_TABLET | Freq: Every evening | ORAL | Status: DC | PRN
Start: 2013-10-02 — End: 2013-10-04

## 2013-10-02 MED ORDER — TERBUTALINE SULFATE 1 MG/ML IJ SOLN: 0.2500 mg | Freq: Once | INTRAMUSCULAR | Status: AC | PRN

## 2013-10-02 MED ORDER — CITRIC ACID-SODIUM CITRATE 334-500 MG/5ML PO SOLN
30.0000 mL | ORAL | Status: DC | PRN
  Administered 2013-10-02: 30 mL via ORAL
  Filled 2013-10-02: qty 15

## 2013-10-02 MED ORDER — ONDANSETRON HCL 4 MG/2ML IJ SOLN: 4.0000 mg | INTRAMUSCULAR | Status: DC | PRN

## 2013-10-02 MED ORDER — ONDANSETRON HCL 4 MG/2ML IJ SOLN
4.0000 mg | Freq: Four times a day (QID) | INTRAMUSCULAR | Status: DC | PRN
Start: 2013-10-02 — End: 2013-10-02
  Administered 2013-10-02: 4 mg via INTRAVENOUS
  Filled 2013-10-02: qty 2

## 2013-10-02 MED ORDER — PHENYLEPHRINE 40 MCG/ML (10ML) SYRINGE FOR IV PUSH (FOR BLOOD PRESSURE SUPPORT)
80.0000 ug | PREFILLED_SYRINGE | INTRAVENOUS | Status: DC | PRN
  Filled 2013-10-02: qty 2

## 2013-10-02 MED ORDER — OXYCODONE-ACETAMINOPHEN 5-325 MG PO TABS: 1.0000 | ORAL_TABLET | ORAL | Status: DC | PRN

## 2013-10-02 MED ORDER — DIPHENHYDRAMINE HCL 25 MG PO CAPS: 25.0000 mg | ORAL_CAPSULE | Freq: Four times a day (QID) | ORAL | Status: DC | PRN

## 2013-10-02 MED ORDER — LIDOCAINE HCL (PF) 1 % IJ SOLN
30.0000 mL | INTRAMUSCULAR | Status: DC | PRN
  Filled 2013-10-02: qty 30

## 2013-10-02 MED ORDER — ACETAMINOPHEN 325 MG PO TABS: 650.0000 mg | ORAL_TABLET | ORAL | Status: DC | PRN

## 2013-10-02 MED ORDER — BENZOCAINE-MENTHOL 20-0.5 % EX AERO: 1.0000 "application " | INHALATION_SPRAY | CUTANEOUS | Status: DC | PRN

## 2013-10-02 MED ORDER — SENNOSIDES-DOCUSATE SODIUM 8.6-50 MG PO TABS
2.0000 | ORAL_TABLET | ORAL | Status: DC
  Administered 2013-10-02 – 2013-10-04 (×2): 2 via ORAL
  Filled 2013-10-02 (×2): qty 2

## 2013-10-02 MED ORDER — PHENYLEPHRINE 40 MCG/ML (10ML) SYRINGE FOR IV PUSH (FOR BLOOD PRESSURE SUPPORT)
80.0000 ug | PREFILLED_SYRINGE | INTRAVENOUS | Status: DC | PRN
  Filled 2013-10-02: qty 2
  Filled 2013-10-02: qty 10

## 2013-10-02 MED ORDER — EPHEDRINE 5 MG/ML INJ
10.0000 mg | INTRAVENOUS | Status: DC | PRN
  Filled 2013-10-02: qty 2

## 2013-10-02 MED ORDER — DIBUCAINE 1 % RE OINT: 1.0000 "application " | TOPICAL_OINTMENT | RECTAL | Status: DC | PRN

## 2013-10-02 MED ORDER — FENTANYL CITRATE 0.05 MG/ML IJ SOLN
100.0000 ug | INTRAMUSCULAR | Status: DC | PRN
  Administered 2013-10-02: 100 ug via INTRAVENOUS
  Filled 2013-10-02: qty 2

## 2013-10-02 MED ORDER — IBUPROFEN 600 MG PO TABS
600.0000 mg | ORAL_TABLET | Freq: Four times a day (QID) | ORAL | Status: DC
  Administered 2013-10-02 – 2013-10-04 (×10): 600 mg via ORAL
  Filled 2013-10-02 (×10): qty 1

## 2013-10-02 MED ORDER — FENTANYL 2.5 MCG/ML BUPIVACAINE 1/10 % EPIDURAL INFUSION (WH - ANES)
14.0000 mL/h | INTRAMUSCULAR | Status: DC | PRN
  Filled 2013-10-02: qty 125

## 2013-10-02 MED ORDER — OXYTOCIN 40 UNITS IN LACTATED RINGERS INFUSION - SIMPLE MED
62.5000 mL/h | INTRAVENOUS | Status: DC
  Filled 2013-10-02: qty 1000

## 2013-10-02 NOTE — Progress Notes (Signed)
Amanda PerfectLatoya M Church is a 28 y.o. G4P3003 at 7452w1d by LMP admitted for PROM  Subjective: Pt. Has progressed very well. S/p epidural at 4am. Now feeling more pressure. No complaints.   Objective: BP 118/50  Pulse 83  Temp(Src) 97.8 F (36.6 C) (Oral)  Resp 18  Ht 5\' 2"  (1.575 m)  Wt 104.327 kg (230 lb)  BMI 42.06 kg/m2  LMP 12/09/2012   Total I/O In: -  Out: 150 [Urine:150]  FHT:  FHR: 120 bpm, variability: moderate,  accelerations:  Present,  decelerations:  Present Variable decels with increasingly stronger contractions. Overall reassuring UC:   regular, every 2 minutes SVE:   Dilation: Lip/rim Effacement (%): 90 Station: 0;+1 Exam by:: Lilli Few. Blackstock, RN  Labs: Lab Results  Component Value Date   WBC 7.4 10/02/2013   HGB 10.7* 10/02/2013   HCT 32.3* 10/02/2013   MCV 81.4 10/02/2013   PLT 223 10/02/2013    Assessment / Plan: Augmentation of labor, progressing well  Labor: Progressing normally and Pt. s/p one dose of Cytotec with good progression. Expectant management at this point.  Preeclampsia:  no signs or symptoms of toxicity Fetal Wellbeing:  Cat II Pain Control:  Epidural and Fentanyl I/D:  n/a Anticipated MOD:  NSVD  Nieve Rojero G 10/02/2013, 6:15 AM

## 2013-10-02 NOTE — Anesthesia Postprocedure Evaluation (Signed)
  Anesthesia Post-op Note  Patient: Amanda Church  Procedure(s) Performed: * No procedures listed *  Patient Location: Mother/Baby  Anesthesia Type:Epidural  Level of Consciousness: awake  Airway and Oxygen Therapy: Patient Spontanous Breathing  Post-op Pain: mild  Post-op Assessment: Patient's Cardiovascular Status Stable and Respiratory Function Stable  Post-op Vital Signs: stable  Last Vitals:  Filed Vitals:   10/02/13 1503  BP:   Pulse:   Temp: 36.8 C  Resp:     Complications: No apparent anesthesia complications
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
## 2013-10-02 NOTE — Anesthesia Preprocedure Evaluation (Signed)
Anesthesia Evaluation  Patient identified by MRN, date of birth, ID band Patient awake    Reviewed: Allergy & Precautions, H&P , NPO status , Patient's Chart, lab work & pertinent test results, reviewed documented beta blocker date and time   Airway Mallampati: II  TM Distance: >3 FB Neck ROM: full    Dental no notable dental hx.    Pulmonary neg pulmonary ROS, Current Smoker,  breath sounds clear to auscultation  Pulmonary exam normal       Cardiovascular Exercise Tolerance: Good negative cardio ROS  Rhythm:regular Rate:Normal     Neuro/Psych negative neurological ROS  negative psych ROS   GI/Hepatic negative GI ROS, Neg liver ROS,   Endo/Other  negative endocrine ROS  Renal/GU negative Renal ROS  negative genitourinary   Musculoskeletal   Abdominal   Peds  Hematology negative hematology ROS (+)   Anesthesia Other Findings   Reproductive/Obstetrics negative OB ROS                             Anesthesia Physical Anesthesia Plan  ASA: II  Anesthesia Plan: General   Post-op Pain Management:    Induction:   Airway Management Planned:   Additional Equipment:   Intra-op Plan:   Post-operative Plan:   Informed Consent: I have reviewed the patients History and Physical, chart, labs and discussed the procedure including the risks, benefits and alternatives for the proposed anesthesia with the patient or authorized representative who has indicated his/her understanding and acceptance.   Dental Advisory Given  Plan Discussed with: CRNA  Anesthesia Plan Comments:         Anesthesia Quick Evaluation  

## 2013-10-02 NOTE — Progress Notes (Signed)
Attempted interview with mother.  However, multiple visitors showed up and decision made to return in the morning to complete the assessment. 

## 2013-10-02 NOTE — Anesthesia Procedure Notes (Signed)
Epidural Patient location during procedure: OB  Staffing Anesthesiologist: Zamier Eggebrecht Performed by: anesthesiologist   Preanesthetic Checklist Completed: patient identified, site marked, surgical consent, pre-op evaluation, timeout performed, IV checked, risks and benefits discussed and monitors and equipment checked  Epidural Patient position: sitting Prep: ChloraPrep Patient monitoring: heart rate, continuous pulse ox and blood pressure Approach: right paramedian Location: L3-L4 Injection technique: LOR saline  Needle:  Needle type: Tuohy  Needle gauge: 17 G Needle length: 9 cm and 9 Needle insertion depth: 7 cm Catheter type: closed end flexible Catheter size: 20 Guage Catheter at skin depth: 12 cm Test dose: negative  Assessment Events: blood not aspirated, injection not painful, no injection resistance, negative IV test and no paresthesia  Additional Notes   Patient tolerated the insertion well without complications.   

## 2013-10-03 LAB — CBC
HCT: 32.6 % — ABNORMAL LOW (ref 36.0–46.0)
Hemoglobin: 10.4 g/dL — ABNORMAL LOW (ref 12.0–15.0)
MCH: 26.4 pg (ref 26.0–34.0)
MCHC: 31.9 g/dL (ref 30.0–36.0)
MCV: 82.7 fL (ref 78.0–100.0)
PLATELETS: 221 10*3/uL (ref 150–400)
RBC: 3.94 MIL/uL (ref 3.87–5.11)
RDW: 18.3 % — AB (ref 11.5–15.5)
WBC: 8.8 10*3/uL (ref 4.0–10.5)

## 2013-10-03 NOTE — Progress Notes (Signed)
Post Partum Day 1 Subjective: no complaints, up ad lib, voiding and tolerating PO  Objective: Blood pressure 126/62, pulse 76, temperature 97.9 F (36.6 C), temperature source Oral, resp. rate 18, height 5\' 2"  (1.575 m), weight 104.327 kg (230 lb), last menstrual period 12/09/2012, SpO2 100.00%, unknown if currently breastfeeding.  Physical Exam:  General: alert, cooperative and no distress Lochia: appropriate Uterine Fundus: firm Incision: healing well DVT Evaluation: No evidence of DVT seen on physical exam.   Recent Labs  10/02/13 0040 10/03/13 0616  HGB 10.7* 10.4*  HCT 32.3* 32.6*    Assessment/Plan: Plan for discharge tomorrow, Breastfeeding, Lactation consult and Social Work consult Wants to talk to SW and lactation today   LOS: 2 days   [facility] PartnershipWILLIAMS,Khaiden Segreto 10/03/2013, 7:21 AM
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
## 2013-10-03 NOTE — H&P (Signed)
`````  Attestation of Attending Supervision of Advanced Practitioner: Evaluation and management procedures were performed by the PA/NP/CNM/OB Fellow under my supervision/collaboration. Chart reviewed and agree with management and plan.  Priest Lockridge V 10/03/2013 6:33 AM

## 2013-10-04 MED ORDER — NORETHINDRONE 0.35 MG PO TABS: 1.0000 | ORAL_TABLET | Freq: Every day | ORAL | Status: DC

## 2013-10-04 MED ORDER — PNEUMOCOCCAL VAC POLYVALENT 25 MCG/0.5ML IJ INJ
0.5000 mL | INJECTION | Freq: Once | INTRAMUSCULAR | Status: DC
  Filled 2013-10-04: qty 0.5

## 2013-10-04 MED ORDER — PNEUMOCOCCAL VAC POLYVALENT 25 MCG/0.5ML IJ INJ: 0.5000 mL | INJECTION | INTRAMUSCULAR | Status: DC

## 2013-10-04 NOTE — Discharge Instructions (Signed)

## 2013-10-04 NOTE — Discharge Summary (Signed)
Obstetric Discharge Summary Reason for Admission: rupture of membranes Prenatal Procedures: none Intrapartum Procedures: spontaneous vaginal delivery Postpartum Procedures: none Complications-Operative and Postpartum: none  Delivery Note At 6:23 AM a viable female was delivered via Vaginal, Spontaneous Delivery (Presentation: ; Occiput Anterior).  APGAR: 8, 9; weight 6 lb 15.3 oz (3155 g).   Placenta status: Intact, Spontaneous.  Cord: 3 vessels with the following complications: None.   Anesthesia: Epidural  Episiotomy: None Lacerations: None Suture Repair: n/a Est. Blood Loss (mL): 250  Mom to postpartum.  Baby to Couplet care / Skin to Skin.  Amanda Church Church 10/04/2013, 10:27 AM     Hospital Course:  Active Problems:   Premature rupture of membranes   Today: No acute events overnight.  Pt denies problems with ambulating, voiding or po intake.  She denies nausea or vomiting.  Pain is well controlled.  She has had flatus. She has not had bowel movement.  Lochia Moderate.  Plan for birth control is  OCPs.  Method of Feeding: breast  Ardeth PerfectLatoya M Church is a 28 y.o. [id number] s/p augmented VD after PROM. She has postoperative course that was uncomplicated including no problems with ambulating, PO intake, urination, pain, or bleeding. The pt feels ready to go home and  will be discharged with outpatient follow-up.    H/H: Lab Results  Component Value Date/Time   HGB 10.4* 10/03/2013  6:16 AM   HCT 32.6* 10/03/2013  6:16 AM   HCT 30 09/21/2013    Discharge Diagnoses: Term Pregnancy-delivered  Discharge Information: Date: @TODAY @ Activity: pelvic rest Diet: routine  Medications: micronor Breast feeding:  Yes Condition: stable Instructions: refer to handout Discharge to: home      Medication List    STOP taking these medications       IRON PO      TAKE these medications       calcium carbonate 500 MG chewable tablet  Commonly known as:  TUMS - dosed in mg  elemental calcium  Chew 2 tablets by mouth 3 (three) times daily as needed for indigestion or heartburn.     norethindrone 0.35 MG tablet  Commonly known as:  ORTHO MICRONOR  Take 1 tablet (0.35 mg total) by mouth daily.     norethindrone 0.35 MG tablet  Commonly known as:  ORTHO MICRONOR  Take 1 tablet (0.35 mg total) by mouth daily.     prenatal multivitamin Tabs tablet  Take 1 tablet by mouth daily at 12 noon.           Follow-up Information   Follow up In 6 weeks.      Perry MountACOSTA,Amanda Church ,MD OB Fellow 10/04/2013,10:27 AM

## 2013-10-04 NOTE — Lactation Note (Signed)
This note was copied from the chart of [name]. Lactation Consultation Note Notified by unit secretary that [facility].  Mom states baby does not stay on breast very long, and her breasts are getting very full without relief.  Assisted mom to place baby in football with good pillow support, baby with shallow latch; compressed the areola to fit better into baby's mouth, and mom states much more comfortable. Breast softening significantly during feeding. Baby gulping, then asleep.  Mom wearing an underwire bra that is too small; enc mom to get a proper fitting nursing bra so milk flows better.  Mom was instructed in the prevention and treatment of engorgement and sore nipples. Mom was encouraged to call lactation department if she has any questions or concerns and to attend the breastfeeding support group.   Patient Name: Girl Marylouise StacksLatoya Wendorf ZOXWR'UToday's Date: 10/04/2013 Reason for consult: Follow-up assessment;MD order   Maternal Data    Feeding Feeding Type: Breast Fed Length of feed: 9 min  LATCH Score/Interventions Latch: Grasps breast easily, tongue down, lips flanged, rhythmical sucking.  Audible Swallowing: Spontaneous and intermittent  Type of Nipple: Everted at rest and after stimulation  Comfort (Breast/Nipple): Soft / non-tender     Hold (Positioning): Assistance needed to correctly position infant at breast and maintain latch. Intervention(s): Support Pillows;Position options  LATCH Score: 9  Lactation Tools Discussed/Used     Consult Status Consult Status: Complete    Lenard ForthSanders, Shanaya Schneck Fulmer 10/04/2013, 10:59 AM

## 2013-10-04 NOTE — Progress Notes (Signed)
Ur chart review completed.  

## 2014-01-10 ENCOUNTER — Encounter (HOSPITAL_COMMUNITY): Payer: Self-pay | Admitting: *Deleted

## 2015-07-16 ENCOUNTER — Inpatient Hospital Stay (HOSPITAL_COMMUNITY)
Admission: EM | Admit: 2015-07-16 | Discharge: 2015-07-17 | Disposition: A | Discharge status: Home / Self Care | Payer: Medicaid Other | Source: Ambulatory Visit | Attending: Obstetrics & Gynecology | Admitting: Obstetrics & Gynecology

## 2015-07-16 ENCOUNTER — Encounter (HOSPITAL_COMMUNITY): Payer: Self-pay | Admitting: *Deleted

## 2015-07-16 DIAGNOSIS — D649 Anemia, unspecified: Secondary | ICD-10-CM | POA: Diagnosis not present

## 2015-07-16 DIAGNOSIS — O99011 Anemia complicating pregnancy, first trimester: Secondary | ICD-10-CM

## 2015-07-16 DIAGNOSIS — Z87891 Personal history of nicotine dependence: Secondary | ICD-10-CM | POA: Diagnosis not present

## 2015-07-16 DIAGNOSIS — Z8614 Personal history of Methicillin resistant Staphylococcus aureus infection: Secondary | ICD-10-CM | POA: Insufficient documentation

## 2015-07-16 DIAGNOSIS — R42 Dizziness and giddiness: Secondary | ICD-10-CM | POA: Diagnosis present

## 2015-07-16 DIAGNOSIS — Z91013 Allergy to seafood: Secondary | ICD-10-CM | POA: Insufficient documentation

## 2015-07-16 DIAGNOSIS — Z91040 Latex allergy status: Secondary | ICD-10-CM | POA: Insufficient documentation

## 2015-07-16 DIAGNOSIS — Z3A01 Less than 8 weeks gestation of pregnancy: Secondary | ICD-10-CM | POA: Insufficient documentation

## 2015-07-16 DIAGNOSIS — O99820 Streptococcus B carrier state complicating pregnancy: Secondary | ICD-10-CM | POA: Diagnosis not present

## 2015-07-16 LAB — URINALYSIS, ROUTINE W REFLEX MICROSCOPIC
BILIRUBIN URINE: NEGATIVE
GLUCOSE, UA: NEGATIVE mg/dL
Hgb urine dipstick: NEGATIVE
Ketones, ur: NEGATIVE mg/dL
Leukocytes, UA: NEGATIVE
Nitrite: NEGATIVE
Protein, ur: NEGATIVE mg/dL
Specific Gravity, Urine: 1.02 (ref 1.005–1.030)
pH: 5.5 (ref 5.0–8.0)

## 2015-07-16 LAB — COMPREHENSIVE METABOLIC PANEL
ALT: 16 U/L (ref 14–54)
AST: 19 U/L (ref 15–41)
Albumin: 4.3 g/dL (ref 3.5–5.0)
Alkaline Phosphatase: 52 U/L (ref 38–126)
Anion gap: 6 (ref 5–15)
BUN: 8 mg/dL (ref 6–20)
CHLORIDE: 105 mmol/L (ref 101–111)
CO2: 23 mmol/L (ref 22–32)
CREATININE: 0.68 mg/dL (ref 0.44–1.00)
Calcium: 9.4 mg/dL (ref 8.9–10.3)
GFR calc Af Amer: >60 mL/min (ref >60)
GFR calc non Af Amer: >60 mL/min (ref >60)
GLUCOSE: 97 mg/dL (ref 65–99)
Potassium: 4.4 mmol/L (ref 3.5–5.1)
Sodium: 134 mmol/L — ABNORMAL LOW (ref 135–145)
Total Bilirubin: 0.4 mg/dL (ref 0.3–1.2)
Total Protein: 7.7 g/dL (ref 6.5–8.1)

## 2015-07-16 LAB — CBC
HCT: 29.8 % — ABNORMAL LOW (ref 36.0–46.0)
Hemoglobin: 8.9 g/dL — ABNORMAL LOW (ref 12.0–15.0)
MCH: 20 pg — AB (ref 26.0–34.0)
MCHC: 29.9 g/dL — ABNORMAL LOW (ref 30.0–36.0)
MCV: 67.1 fL — AB (ref 78.0–100.0)
PLATELETS: 397 10*3/uL (ref 150–400)
RBC: 4.44 MIL/uL (ref 3.87–5.11)
RDW: 18.6 % — AB (ref 11.5–15.5)
WBC: 7.5 10*3/uL (ref 4.0–10.5)

## 2015-07-16 LAB — POCT PREGNANCY, URINE: Preg Test, Ur: POSITIVE — AB

## 2015-07-16 MED ORDER — FERROUS SULFATE 325 (65 FE) MG PO TABS: 325.0000 mg | ORAL_TABLET | Freq: Two times a day (BID) | ORAL | Status: DC

## 2015-07-16 NOTE — Discharge Instructions (Signed)
Pregnancy and Anemia °Anemia is a condition in which the concentration of red blood cells or hemoglobin in the blood is below normal. Hemoglobin is a substance in red blood cells that carries oxygen to the tissues of the body. Anemia results in not enough oxygen reaching these tissues.  °Anemia during pregnancy is common because the fetus uses more iron and folic acid as it is developing. Your body may not produce enough red blood cells because of this. Also, during pregnancy, the liquid part of the blood (plasma) increases by about 50%, and the red blood cells increase by only 25%. This lowers the concentration of the red blood cells and creates a natural anemia-like situation.  °CAUSES  °The most common cause of anemia during pregnancy is not having enough iron in the body to make red blood cells (iron deficiency anemia). Other causes may include: °· Folic acid deficiency. °· Vitamin B12 deficiency. °· Certain prescription or over-the-counter medicines. °· Certain medical conditions or infections that destroy red blood cells. °· A low platelet count and bleeding caused by antibodies that go through the placenta to the fetus from the mother's blood. °SIGNS AND SYMPTOMS  °Mild anemia may not be noticeable. If it becomes severe, symptoms may include: °· Tiredness. °· Shortness of breath, especially with exercise. °· Weakness. °· Fainting. °· Pale looking skin. °· Headaches. °· Feeling a fast or irregular heartbeat (palpitations). °DIAGNOSIS  °The type of anemia is usually diagnosed from your family and medical history and blood tests. °TREATMENT  °Treatment of anemia during pregnancy depends on the cause of the anemia. Treatment can include: °· Supplements of iron, vitamin B12, or folic acid. °· A blood transfusion. This may be needed if blood loss is severe. °· Hospitalization. This may be needed if there is significant continual blood loss. °· Dietary changes. °HOME CARE INSTRUCTIONS  °· Follow your dietitian's or  health care provider's dietary recommendations. °· Increase your vitamin C intake. This will help the stomach absorb more iron. °· Eat a diet rich in iron. This would include foods such as: °¨ Liver. °¨ Beef. °¨ Whole grain bread. °¨ Eggs. °¨ Dried fruit. °· Take iron and vitamins as directed by your health care provider. °· Eat green leafy vegetables. These are a good source of folic acid. °SEEK MEDICAL CARE IF:  °· You have frequent or lasting headaches. °· You are looking pale. °· You are bruising easily. °SEEK IMMEDIATE MEDICAL CARE IF:  °· You have extreme weakness, shortness of breath, or chest pain. °· You become dizzy or have trouble concentrating. °· You have heavy vaginal bleeding. °· You develop a rash. °· You have bloody or black, tarry stools. °· You faint. °· You vomit up blood. °· You vomit repeatedly. °· You have abdominal pain. °· You have a fever or persistent symptoms for more than 2-3 days. °· You have a fever and your symptoms suddenly get worse. °· You are dehydrated. °MAKE SURE YOU:  °· Understand these instructions. °· Will watch your condition. °· Will get help right away if you are not doing well or get worse. °  °This information is not intended to replace advice given to you by your health care provider. Make sure you discuss any questions you have with your health care provider. °  °Document Released: 02/23/2000 Document Revised: 12/16/2012 Document Reviewed: 10/07/2012 °Elsevier Interactive Patient Education ©2016 [employer] ° °
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
## 2015-07-16 NOTE — MAU Note (Signed)
Pt c/o being lightheaded and fatigued. Started out when she stood up, but is now all the time. Tried eating-helped some. Had a cone biopsy on April 11-[facility]. Denies bleeding or discharge. Currently taking medication for BV. Some mild back pain-rates 4/10.

## 2016-05-20 ENCOUNTER — Encounter (HOSPITAL_COMMUNITY): Payer: Self-pay

## 2016-10-10 DIAGNOSIS — O34219 Maternal care for unspecified type scar from previous cesarean delivery: Secondary | ICD-10-CM | POA: Insufficient documentation

## 2016-10-10 DIAGNOSIS — R87613 High grade squamous intraepithelial lesion on cytologic smear of cervix (HGSIL): Secondary | ICD-10-CM | POA: Insufficient documentation

## 2018-02-02 ENCOUNTER — Encounter (HOSPITAL_COMMUNITY): Payer: Self-pay | Admitting: Emergency Medicine

## 2018-02-02 ENCOUNTER — Ambulatory Visit (HOSPITAL_COMMUNITY)
Admission: EM | Admit: 2018-02-02 | Discharge: 2018-02-02 | Disposition: A | Discharge status: Home / Self Care | Payer: Medicaid Other | Attending: Internal Medicine | Admitting: Internal Medicine

## 2018-02-02 DIAGNOSIS — J069 Acute upper respiratory infection, unspecified: Secondary | ICD-10-CM

## 2018-02-02 DIAGNOSIS — J029 Acute pharyngitis, unspecified: Secondary | ICD-10-CM | POA: Diagnosis present

## 2018-02-02 DIAGNOSIS — H579 Unspecified disorder of eye and adnexa: Secondary | ICD-10-CM | POA: Diagnosis not present

## 2018-02-02 DIAGNOSIS — M791 Myalgia, unspecified site: Secondary | ICD-10-CM | POA: Diagnosis not present

## 2018-02-02 LAB — POCT RAPID STREP A: STREPTOCOCCUS, GROUP A SCREEN (DIRECT): NEGATIVE

## 2018-02-02 MED ORDER — BENZONATATE 200 MG PO CAPS: 200.0000 mg | ORAL_CAPSULE | Freq: Three times a day (TID) | ORAL | 0 refills | Status: DC | PRN

## 2018-02-02 NOTE — ED Triage Notes (Signed)
Pt states a couple days ago her throat was scratchy. Pt also states she thinks shes scratched both of her eyes. Both eyes itchy. Pt states she also has body aches.
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
## 2018-02-02 NOTE — Discharge Instructions (Signed)
No danger signs on exam today.  Symptoms today seem most consistent with a viral respiratory infection.  Push fluids and rest.  Note for work today and tomorrow.  Anticipate gradual improvement over the next several days.  Prescription for benzonatate, for cough, was sent to the pharmacy.  Tylenol or ibuprofen OTC as needed for achiness.  Recheck for new fever >100.5, increasing phlegm production/nasal discharge, or if not starting to improve in a few days.

## 2018-02-04 ENCOUNTER — Telehealth (HOSPITAL_COMMUNITY): Payer: Self-pay | Admitting: Emergency Medicine

## 2018-02-04 LAB — CULTURE, GROUP A STREP (THRC)

## 2018-02-04 MED ORDER — PENICILLIN V POTASSIUM 500 MG PO TABS: 500.0000 mg | ORAL_TABLET | Freq: Two times a day (BID) | ORAL | 0 refills | Status: DC

## 2018-02-04 NOTE — Telephone Encounter (Signed)
Culture is positive for group A Strep germ.  Prescription for penicillin V 500mg bid x 10d #20 no refills sent to the pharmacy of record. Attempted to reach patient. No answer at this time. Voicemail left.    

## 2018-02-07 ENCOUNTER — Telehealth (HOSPITAL_COMMUNITY): Payer: Self-pay | Admitting: Emergency Medicine

## 2018-02-07 NOTE — Telephone Encounter (Signed)
Attempted to reach patient x2. No answer at this time. Voicemail full  

## 2018-02-10 ENCOUNTER — Telehealth (HOSPITAL_COMMUNITY): Payer: Self-pay | Admitting: Emergency Medicine

## 2018-02-10 MED ORDER — PENICILLIN V POTASSIUM 500 MG PO TABS: 500.0000 mg | ORAL_TABLET | Freq: Two times a day (BID) | ORAL | 0 refills | Status: DC

## 2018-02-10 NOTE — Telephone Encounter (Signed)
Spoke with pt gave her results and sent RX to correct pharmacy; pt verbalized understanding

## 2018-02-23 ENCOUNTER — Ambulatory Visit (HOSPITAL_COMMUNITY)
Admission: EM | Admit: 2018-02-23 | Discharge: 2018-02-23 | Disposition: A | Discharge status: Home / Self Care | Payer: Medicaid Other | Attending: Internal Medicine | Admitting: Internal Medicine

## 2018-02-23 ENCOUNTER — Encounter (HOSPITAL_COMMUNITY): Payer: Self-pay | Admitting: *Deleted

## 2018-02-23 ENCOUNTER — Other Ambulatory Visit: Payer: Self-pay

## 2018-02-23 DIAGNOSIS — G5603 Carpal tunnel syndrome, bilateral upper limbs: Secondary | ICD-10-CM | POA: Diagnosis not present

## 2018-02-23 MED ORDER — CARPAL TUNNEL WRIST STABILIZER MISC: 1 refills | Status: DC

## 2018-02-23 MED ORDER — GABAPENTIN 100 MG PO CAPS: 100.0000 mg | ORAL_CAPSULE | Freq: Three times a day (TID) | ORAL | 1 refills | Status: DC

## 2018-02-23 MED ORDER — MELOXICAM 7.5 MG PO TABS: 7.5000 mg | ORAL_TABLET | Freq: Every day | ORAL | 0 refills | Status: DC

## 2018-02-23 NOTE — ED Triage Notes (Signed)
States she has been having left wrist pain for 10 months was told she had carpal tunnel states yest she lifting a case of water and has severe burning in left wrist.
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
## 2018-02-25 ENCOUNTER — Telehealth (HOSPITAL_COMMUNITY): Payer: Self-pay | Admitting: Emergency Medicine

## 2018-02-25 MED ORDER — GABAPENTIN 100 MG PO CAPS: 100.0000 mg | ORAL_CAPSULE | Freq: Three times a day (TID) | ORAL | 1 refills | Status: DC

## 2018-02-25 MED ORDER — MELOXICAM 7.5 MG PO TABS: 7.5000 mg | ORAL_TABLET | Freq: Every day | ORAL | 0 refills | Status: AC

## 2018-05-11 ENCOUNTER — Emergency Department (HOSPITAL_COMMUNITY)
Admission: EM | Admit: 2018-05-11 | Discharge: 2018-05-11 | Disposition: A | Discharge status: Home / Self Care | Payer: Medicaid Other | Attending: Emergency Medicine | Admitting: Emergency Medicine

## 2018-05-11 ENCOUNTER — Emergency Department (HOSPITAL_COMMUNITY): Payer: Medicaid Other

## 2018-05-11 ENCOUNTER — Encounter (HOSPITAL_COMMUNITY): Payer: Self-pay | Admitting: *Deleted

## 2018-05-11 ENCOUNTER — Other Ambulatory Visit: Payer: Self-pay

## 2018-05-11 DIAGNOSIS — D649 Anemia, unspecified: Secondary | ICD-10-CM | POA: Diagnosis not present

## 2018-05-11 DIAGNOSIS — F17210 Nicotine dependence, cigarettes, uncomplicated: Secondary | ICD-10-CM | POA: Diagnosis not present

## 2018-05-11 DIAGNOSIS — Z79899 Other long term (current) drug therapy: Secondary | ICD-10-CM | POA: Diagnosis not present

## 2018-05-11 DIAGNOSIS — M549 Dorsalgia, unspecified: Secondary | ICD-10-CM | POA: Diagnosis present

## 2018-05-11 DIAGNOSIS — M545 Low back pain, unspecified: Secondary | ICD-10-CM

## 2018-05-11 LAB — URINALYSIS, ROUTINE W REFLEX MICROSCOPIC
Bilirubin Urine: NEGATIVE
Glucose, UA: NEGATIVE mg/dL
Hgb urine dipstick: NEGATIVE
Ketones, ur: NEGATIVE mg/dL
Nitrite: POSITIVE — AB
Protein, ur: NEGATIVE mg/dL
SPECIFIC GRAVITY, URINE: 1.019 (ref 1.005–1.030)
pH: 6 (ref 5.0–8.0)

## 2018-05-11 LAB — COMPREHENSIVE METABOLIC PANEL
ALT: 17 U/L (ref 0–44)
AST: 20 U/L (ref 15–41)
Albumin: 3.9 g/dL (ref 3.5–5.0)
Alkaline Phosphatase: 71 U/L (ref 38–126)
Anion gap: 9 (ref 5–15)
BUN: 10 mg/dL (ref 6–20)
CO2: 19 mmol/L — ABNORMAL LOW (ref 22–32)
CREATININE: 0.73 mg/dL (ref 0.44–1.00)
Calcium: 9.1 mg/dL (ref 8.9–10.3)
Chloride: 107 mmol/L (ref 98–111)
GFR calc Af Amer: >60 mL/min (ref >60)
GFR calc non Af Amer: >60 mL/min (ref >60)
Glucose, Bld: 96 mg/dL (ref 70–99)
Potassium: 3.9 mmol/L (ref 3.5–5.1)
Sodium: 135 mmol/L (ref 135–145)
Total Bilirubin: 0.2 mg/dL — ABNORMAL LOW (ref 0.3–1.2)
Total Protein: 7.6 g/dL (ref 6.5–8.1)

## 2018-05-11 LAB — CBC WITH DIFFERENTIAL/PLATELET
Abs Immature Granulocytes: 0.03 10*3/uL (ref 0.00–0.07)
Basophils Absolute: 0.1 10*3/uL (ref 0.0–0.1)
Basophils Relative: 1 %
Eosinophils Absolute: 0.2 10*3/uL (ref 0.0–0.5)
Eosinophils Relative: 2 %
HCT: 27.6 % — ABNORMAL LOW (ref 36.0–46.0)
Hemoglobin: 7 g/dL — ABNORMAL LOW (ref 12.0–15.0)
Immature Granulocytes: 0 %
Lymphocytes Relative: 31 %
Lymphs Abs: 2.7 10*3/uL (ref 0.7–4.0)
MCH: 16.2 pg — ABNORMAL LOW (ref 26.0–34.0)
MCHC: 25.4 g/dL — AB (ref 30.0–36.0)
MCV: 64 fL — AB (ref 80.0–100.0)
MONOS PCT: 6 %
Monocytes Absolute: 0.5 10*3/uL (ref 0.1–1.0)
Neutro Abs: 5.1 10*3/uL (ref 1.7–7.7)
Neutrophils Relative %: 60 %
Platelets: 234 10*3/uL (ref 150–400)
RBC: 4.31 MIL/uL (ref 3.87–5.11)
RDW: 20.6 % — ABNORMAL HIGH (ref 11.5–15.5)
WBC: 8.5 10*3/uL (ref 4.0–10.5)
nRBC: 0 % (ref 0.0–0.2)

## 2018-05-11 LAB — PREGNANCY, URINE: PREG TEST UR: NEGATIVE

## 2018-05-11 MED ORDER — OXYCODONE-ACETAMINOPHEN 5-325 MG PO TABS
2.0000 | ORAL_TABLET | Freq: Once | ORAL | Status: AC
  Administered 2018-05-11: 1 via ORAL
  Filled 2018-05-11: qty 2

## 2018-05-11 MED ORDER — METHOCARBAMOL 1000 MG/10ML IJ SOLN
1000.0000 mg | Freq: Once | INTRAMUSCULAR | Status: DC
  Filled 2018-05-11: qty 10

## 2018-05-11 MED ORDER — KETOROLAC TROMETHAMINE 15 MG/ML IJ SOLN
15.0000 mg | Freq: Once | INTRAMUSCULAR | Status: AC
  Administered 2018-05-11: 15 mg via INTRAVENOUS
  Filled 2018-05-11: qty 1

## 2018-05-11 MED ORDER — SODIUM CHLORIDE 0.9 % IV BOLUS (SEPSIS)
1000.0000 mL | Freq: Once | INTRAVENOUS | Status: AC
  Administered 2018-05-11: 1000 mL via INTRAVENOUS

## 2018-05-11 MED ORDER — LORAZEPAM 2 MG/ML IJ SOLN
1.0000 mg | Freq: Once | INTRAMUSCULAR | Status: AC
  Administered 2018-05-11: 1 mg via INTRAVENOUS
  Filled 2018-05-11: qty 1

## 2018-05-11 MED ORDER — FERROUS SULFATE 300 (60 FE) MG/5ML PO SYRP: 300.0000 mg | ORAL_SOLUTION | Freq: Two times a day (BID) | ORAL | 0 refills | Status: DC

## 2018-05-11 MED ORDER — HYDROCODONE-ACETAMINOPHEN 5-325 MG PO TABS: 2.0000 | ORAL_TABLET | ORAL | 0 refills | Status: DC | PRN

## 2018-05-11 MED ORDER — METHOCARBAMOL 500 MG PO TABS
1000.0000 mg | ORAL_TABLET | Freq: Once | ORAL | Status: AC
  Administered 2018-05-11: 1000 mg via ORAL
  Filled 2018-05-11: qty 2

## 2018-05-11 MED ORDER — MORPHINE SULFATE (PF) 4 MG/ML IV SOLN
4.0000 mg | Freq: Once | INTRAVENOUS | Status: AC
  Administered 2018-05-11: 4 mg via INTRAVENOUS
  Filled 2018-05-11: qty 1

## 2018-05-11 MED ORDER — CYCLOBENZAPRINE HCL 10 MG PO TABS: 10.0000 mg | ORAL_TABLET | Freq: Two times a day (BID) | ORAL | 0 refills | Status: AC | PRN

## 2018-05-11 NOTE — ED Notes (Signed)
Given water

## 2018-05-11 NOTE — ED Notes (Signed)
Patient verbalizes understanding of discharge instructions. Opportunity for questioning and answers were provided. Armband removed by staff, pt discharged from ED in wheelchair.  

## 2018-05-11 NOTE — ED Notes (Signed)
Patient transported to X-ray 

## 2018-05-11 NOTE — Discharge Instructions (Signed)
Thank you for allowing me to care for you today. Please return to the emergency department if you have new or worsening symptoms. Take your medications as instructed.  Be aware that narcotic pain medication can cause sleepiness, drowsiness, overdose and addiction.

## 2018-05-11 NOTE — ED Notes (Signed)
Patient ambulated in hallway, C/O pain in lower back. Patient asked for urine sample, states that she is unable to give urine sample at this time.

## 2018-05-11 NOTE — ED Triage Notes (Signed)
Pt reports she was at home when she started to have sudden onset of lower back pain which made it difficult to stand up or ambulate.  She states she was not able to go to the bathroom d/t the pain so she urinated on herself.  She denies any tingling or numbness in her legs, or any urine or bowel incontinence.  She reports she did not have any back pain yesterday until this am.  She states when she moves her feet or legs a certain way, her muscles "tightens" up.  She has 6 kids and has had several epidurals and was told that she will have low back pain once in a while but states "not this bad."  Pt is crying in triage, she is upset because her boyfriend thinks that she is acting up and not really in severe pain.
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
## 2018-05-14 LAB — URINE CULTURE: Culture: >=100000 — AB

## 2018-05-15 ENCOUNTER — Telehealth: Payer: Self-pay | Admitting: *Deleted

## 2018-05-15 NOTE — Telephone Encounter (Signed)
Post ED Visit - Positive Culture Follow-up  Culture report reviewed by antimicrobial stewardship pharmacist: [name] []  Enzo Bi, Pharm.D. []  Celedonio Miyamoto, Pharm.D., BCPS AQ-ID []  Garvin Fila, Pharm.D., BCPS []  Georgina Pillion, Pharm.D., BCPS []  [place], [address].D., BCPS, AAHIVP []  Estella Husk, Pharm.D., BCPS, AAHIVP []  Lysle Pearl, PharmD, BCPS []  Phillips Climes, PharmD, BCPS []  Agapito Games, PharmD, BCPS []  Verlan Friends, PharmD []  Mervyn Gay, PharmD, BCPS []  Vinnie Level, PharmD Wendelyn Breslow, PharmD  [facility] []  Len Childs, PharmD []  Greer Pickerel, PharmD []  Adalberto Cole, PharmD []  Perlie Gold, Rph []  Lonell Face) Jean Rosenthal, PharmD []  Earl Many, PharmD []  Junita Push, PharmD []  Dorna Leitz, PharmD []  Terrilee Files, PharmD []  Lynann Beaver, PharmD []  Keturah Barre, PharmD []  Loralee Pacas, PharmD []  Bernadene Person, PharmD   Positive urine culture, reviewed by Army Melia, PA-C No further patient follow-up is required at this time.  Virl Axe [facility] 05/15/2018, 9:23 AM

## 2018-12-21 ENCOUNTER — Encounter: Payer: Self-pay | Admitting: Emergency Medicine

## 2018-12-21 ENCOUNTER — Other Ambulatory Visit: Payer: Self-pay

## 2018-12-21 ENCOUNTER — Ambulatory Visit
Admission: EM | Admit: 2018-12-21 | Discharge: 2018-12-21 | Disposition: A | Discharge status: Home / Self Care | Payer: Medicaid Other | Attending: Emergency Medicine | Admitting: Emergency Medicine

## 2018-12-21 DIAGNOSIS — R32 Unspecified urinary incontinence: Secondary | ICD-10-CM

## 2018-12-21 DIAGNOSIS — J029 Acute pharyngitis, unspecified: Secondary | ICD-10-CM | POA: Diagnosis not present

## 2018-12-21 DIAGNOSIS — R8281 Pyuria: Secondary | ICD-10-CM | POA: Diagnosis not present

## 2018-12-21 LAB — POCT URINALYSIS DIP (MANUAL ENTRY)
Bilirubin, UA: NEGATIVE
Blood, UA: NEGATIVE
Glucose, UA: NEGATIVE mg/dL
Ketones, POC UA: NEGATIVE mg/dL
Nitrite, UA: NEGATIVE
Protein Ur, POC: NEGATIVE mg/dL
Spec Grav, UA: 1.025 (ref 1.010–1.025)
Urobilinogen, UA: 1 E.U./dL
pH, UA: 6.5 (ref 5.0–8.0)

## 2018-12-21 LAB — POCT URINE PREGNANCY: Preg Test, Ur: NEGATIVE

## 2018-12-21 LAB — POCT RAPID STREP A (OFFICE): Rapid Strep A Screen: NEGATIVE

## 2018-12-21 MED ORDER — FLUCONAZOLE 200 MG PO TABS: 200.0000 mg | ORAL_TABLET | Freq: Once | ORAL | 0 refills | Status: AC

## 2018-12-21 MED ORDER — CEFTRIAXONE SODIUM 1 G IJ SOLR
1.0000 g | Freq: Once | INTRAMUSCULAR | Status: AC
  Administered 2018-12-21: 1 g via INTRAMUSCULAR

## 2018-12-21 MED ORDER — AMOXICILLIN 500 MG PO CAPS: 500.0000 mg | ORAL_CAPSULE | Freq: Two times a day (BID) | ORAL | 0 refills | Status: AC

## 2018-12-21 NOTE — Discharge Instructions (Addendum)
Call your OB/GYN tomorrow to schedule a 1-2 week urgent care follow up - important to mention March 2020 ER visit for back pain that preceded current symptoms. We will call you with your urine culture results and treat for infection if needed. Take antibiotic as prescribed -May take Diflucan at the end of your antibiotic course, again in 3 days if needed for yeast infection. For worsening sore throat, high fever, difficulty breathing or swallowing go to the ER immediately.

## 2018-12-21 NOTE — ED Triage Notes (Addendum)
Pt presents to [facility] for assessment after x 1 month of loss of control of bladder.  States she is now trickling on herself before she realizes she needs to go to the bathroom.  Denies itching, denies odor, denies pain with urination.  C/o right sided lower back pain intermittently since.    Pt states in late December of last year she had an episode of back pain so severe she had to go to the emergency room
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
## 2018-12-21 NOTE — ED Notes (Signed)
Patient able to ambulate independently  

## 2018-12-22 ENCOUNTER — Telehealth: Payer: Self-pay | Admitting: Emergency Medicine

## 2018-12-22 NOTE — Telephone Encounter (Signed)
Checked in on patient, discussed medications, and encouraged return call with any continuing questions or concerns.    

## 2018-12-23 LAB — URINE CULTURE

## 2018-12-27 LAB — CULTURE, GROUP A STREP (THRC)

## 2019-08-05 ENCOUNTER — Other Ambulatory Visit: Payer: Self-pay

## 2019-08-05 ENCOUNTER — Ambulatory Visit (INDEPENDENT_AMBULATORY_CARE_PROVIDER_SITE_OTHER): Payer: Medicaid Other

## 2019-08-05 ENCOUNTER — Ambulatory Visit (HOSPITAL_COMMUNITY)
Admission: EM | Admit: 2019-08-05 | Discharge: 2019-08-05 | Disposition: A | Discharge status: Home / Self Care | Payer: Medicaid Other | Attending: Urgent Care | Admitting: Urgent Care

## 2019-08-05 ENCOUNTER — Encounter (HOSPITAL_COMMUNITY): Payer: Self-pay | Admitting: Emergency Medicine

## 2019-08-05 DIAGNOSIS — Z3202 Encounter for pregnancy test, result negative: Secondary | ICD-10-CM | POA: Diagnosis not present

## 2019-08-05 DIAGNOSIS — M79672 Pain in left foot: Secondary | ICD-10-CM

## 2019-08-05 DIAGNOSIS — M79602 Pain in left arm: Secondary | ICD-10-CM

## 2019-08-05 DIAGNOSIS — R200 Anesthesia of skin: Secondary | ICD-10-CM

## 2019-08-05 DIAGNOSIS — R202 Paresthesia of skin: Secondary | ICD-10-CM

## 2019-08-05 LAB — POC URINE PREG, ED: Preg Test, Ur: NEGATIVE

## 2019-08-05 MED ORDER — PREDNISONE 20 MG PO TABS: ORAL_TABLET | ORAL | 0 refills | Status: DC
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
## 2019-08-05 NOTE — ED Triage Notes (Signed)
PT reports pain in left upper arm started two days ago. Pain worsened and began to "shoot" down left arm into left fifth finger. No injury to arm. Pain continues.   Also reports pain in left foot that started yesterday. No injury. It is hard to bend left toes due to pain.

## 2019-08-17 ENCOUNTER — Ambulatory Visit: Payer: Medicaid Other | Attending: Family

## 2019-08-17 DIAGNOSIS — Z23 Encounter for immunization: Secondary | ICD-10-CM

## 2019-08-17 NOTE — Progress Notes (Signed)
° °  Covid-19 Vaccination Clinic  Name:  Amanda Church    MRN: [id number] DOB: 05-10-1985  08/17/2019  [name] was observed post Covid-19 immunization for 15 minutes without incident. She was provided with Vaccine Information Sheet and instruction to access the V-Safe system.   [name] was instructed to call 911 with any severe reactions post vaccine:  Difficulty breathing   Swelling of face and throat   A fast heartbeat   A bad rash all over body   Dizziness and weakness   Immunizations Administered    Name Date Dose VIS Date Route   Moderna COVID-19 Vaccine 08/17/2019  1:37 PM 0.5 mL 02/2019 Intramuscular   Manufacturer: Gala Murdoch   Lot: [id number]   NDC: 86754-492-01

## 2019-09-14 ENCOUNTER — Ambulatory Visit: Payer: Medicaid Other | Attending: Family

## 2019-09-14 DIAGNOSIS — Z23 Encounter for immunization: Secondary | ICD-10-CM

## 2019-09-14 NOTE — Progress Notes (Signed)
   Covid-19 Vaccination Clinic  Name:  Amanda Church    MRN: [id number] DOB: 08-Dec-1985  09/14/2019  [name] was observed post Covid-19 immunization for 15 minutes without incident. She was provided with Vaccine Information Sheet and instruction to access the V-Safe system.   [name] was instructed to call 911 with any severe reactions post vaccine: Marland Kitchen Difficulty breathing  . Swelling of face and throat  . A fast heartbeat  . A bad rash all over body  . Dizziness and weakness   Immunizations Administered    Name Date Dose VIS Date Route   Moderna COVID-19 Vaccine 09/14/2019  1:10 PM 0.5 mL 02/2019 Intramuscular   Manufacturer: Moderna   Lot: [id number]   NDC: 10932-355-73

## 2019-11-18 ENCOUNTER — Telehealth: Payer: Self-pay | Admitting: *Deleted

## 2019-11-18 ENCOUNTER — Ambulatory Visit: Payer: Medicaid Other | Admitting: Neurology

## 2019-11-18 NOTE — Telephone Encounter (Signed)
Pt called at 2:50pm to cancel her 3:30pm new patient appt due to not have childcare.

## 2020-01-24 ENCOUNTER — Ambulatory Visit: Payer: Medicaid Other | Admitting: Neurology

## 2020-01-24 ENCOUNTER — Encounter: Payer: Self-pay | Admitting: Neurology

## 2020-04-13 ENCOUNTER — Ambulatory Visit: Payer: Medicaid Other | Admitting: Neurology

## 2020-04-13 ENCOUNTER — Encounter: Payer: Self-pay | Admitting: *Deleted

## 2020-04-13 ENCOUNTER — Telehealth: Payer: Self-pay | Admitting: Neurology

## 2020-04-13 ENCOUNTER — Encounter: Payer: Self-pay | Admitting: Neurology

## 2020-04-13 VITALS — BP 149/87 | HR 84 | Ht 61.5 in | Wt 235.5 lb

## 2020-04-13 DIAGNOSIS — M545 Low back pain, unspecified: Secondary | ICD-10-CM | POA: Insufficient documentation

## 2020-04-13 DIAGNOSIS — R159 Full incontinence of feces: Secondary | ICD-10-CM | POA: Diagnosis not present

## 2020-04-13 DIAGNOSIS — G43709 Chronic migraine without aura, not intractable, without status migrainosus: Secondary | ICD-10-CM | POA: Diagnosis not present

## 2020-04-13 DIAGNOSIS — M5440 Lumbago with sciatica, unspecified side: Secondary | ICD-10-CM

## 2020-04-13 DIAGNOSIS — G8929 Other chronic pain: Secondary | ICD-10-CM | POA: Insufficient documentation

## 2020-04-13 DIAGNOSIS — D649 Anemia, unspecified: Secondary | ICD-10-CM | POA: Insufficient documentation

## 2020-04-13 DIAGNOSIS — G8220 Paraplegia, unspecified: Secondary | ICD-10-CM | POA: Insufficient documentation

## 2020-04-13 DIAGNOSIS — R29898 Other symptoms and signs involving the musculoskeletal system: Secondary | ICD-10-CM | POA: Diagnosis not present

## 2020-04-13 DIAGNOSIS — G43009 Migraine without aura, not intractable, without status migrainosus: Secondary | ICD-10-CM | POA: Insufficient documentation

## 2020-04-13 HISTORY — DX: Other symptoms and signs involving the musculoskeletal system: R29.898

## 2020-04-13 MED ORDER — NORTRIPTYLINE HCL 25 MG PO CAPS: 50.0000 mg | ORAL_CAPSULE | Freq: Every day | ORAL | 11 refills | Status: DC

## 2020-04-13 MED ORDER — RIZATRIPTAN BENZOATE 10 MG PO TBDP: 10.0000 mg | ORAL_TABLET | ORAL | 6 refills | Status: DC | PRN
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
## 2020-04-13 NOTE — Telephone Encounter (Signed)
medicaid order sent to GI. No auth they will reach out to the patient to schedule.  

## 2020-04-14 ENCOUNTER — Telehealth: Payer: Self-pay | Admitting: Hematology

## 2020-04-14 ENCOUNTER — Telehealth: Payer: Self-pay | Admitting: Neurology

## 2020-04-14 DIAGNOSIS — D509 Iron deficiency anemia, unspecified: Secondary | ICD-10-CM | POA: Insufficient documentation

## 2020-04-14 NOTE — Telephone Encounter (Signed)
Received a new hem referral from [facility] for anemia. Ms. Amanda Church has been cld and scheduled to see Dr. Mosetta Putt on 2/7 at 3pm. Pt aware to arrive 20 minutes early.

## 2020-04-14 NOTE — Telephone Encounter (Signed)
I spoke to the patient. She verbalized understanding of the lab findings and is in agreement to the hematology referral.

## 2020-04-14 NOTE — Telephone Encounter (Signed)
Please call patient, laboratory evaluation showed significantly decreased ferritin level, less than 2, indicating severe iron deficiency,  I will refer her to hematologist for treatment Continued evidence of significant anemia, with hemoglobin of 7.6,   Rest of the laboratory evaluation showed no significant abnormalities.

## 2020-04-15 LAB — COMPREHENSIVE METABOLIC PANEL
ALT: 13 IU/L (ref 0–32)
AST: 16 IU/L (ref 0–40)
Albumin/Globulin Ratio: 1.4 (ref 1.2–2.2)
Albumin: 4.1 g/dL (ref 3.8–4.8)
Alkaline Phosphatase: 82 IU/L (ref 44–121)
BUN/Creatinine Ratio: 13 (ref 9–23)
BUN: 9 mg/dL (ref 6–20)
Bilirubin Total: <0.2 mg/dL (ref 0.0–1.2)
CO2: 19 mmol/L — ABNORMAL LOW (ref 20–29)
Calcium: 9.1 mg/dL (ref 8.7–10.2)
Chloride: 103 mmol/L (ref 96–106)
Creatinine, Ser: 0.68 mg/dL (ref 0.57–1.00)
GFR calc Af Amer: 132 mL/min/{1.73_m2} (ref >59)
GFR calc non Af Amer: 114 mL/min/{1.73_m2} (ref >59)
Globulin, Total: 3 g/dL (ref 1.5–4.5)
Glucose: 77 mg/dL (ref 65–99)
Potassium: 4.8 mmol/L (ref 3.5–5.2)
Sodium: 136 mmol/L (ref 134–144)
Total Protein: 7.1 g/dL (ref 6.0–8.5)

## 2020-04-15 LAB — CBC WITH DIFFERENTIAL/PLATELET
Basophils Absolute: 0.1 10*3/uL (ref 0.0–0.2)
Basos: 1 %
EOS (ABSOLUTE): 0.3 10*3/uL (ref 0.0–0.4)
Eos: 5 %
Hematocrit: 28.3 % — ABNORMAL LOW (ref 34.0–46.6)
Hemoglobin: 7.6 g/dL — ABNORMAL LOW (ref 11.1–15.9)
Immature Grans (Abs): 0 10*3/uL (ref 0.0–0.1)
Immature Granulocytes: 0 %
Lymphocytes Absolute: 2.9 10*3/uL (ref 0.7–3.1)
Lymphs: 43 %
MCH: 16.7 pg — ABNORMAL LOW (ref 26.6–33.0)
MCHC: 26.9 g/dL — ABNORMAL LOW (ref 31.5–35.7)
MCV: 62 fL — ABNORMAL LOW (ref 79–97)
Monocytes Absolute: 0.4 10*3/uL (ref 0.1–0.9)
Monocytes: 7 %
Neutrophils Absolute: 2.8 10*3/uL (ref 1.4–7.0)
Neutrophils: 44 %
Platelets: 457 10*3/uL — ABNORMAL HIGH (ref 150–450)
RBC: 4.54 x10E6/uL (ref 3.77–5.28)
RDW: 21.2 % — ABNORMAL HIGH (ref 11.7–15.4)
WBC: 6.5 10*3/uL (ref 3.4–10.8)

## 2020-04-15 LAB — IRON AND TIBC
Iron Saturation: 3 % — CL (ref 15–55)
Iron: 14 ug/dL — ABNORMAL LOW (ref 27–159)
Total Iron Binding Capacity: 523 ug/dL — ABNORMAL HIGH (ref 250–450)
UIBC: 509 ug/dL — ABNORMAL HIGH (ref 131–425)

## 2020-04-15 LAB — VITAMIN B12: Vitamin B-12: 384 pg/mL (ref 232–1245)

## 2020-04-15 LAB — CK: Total CK: 83 U/L (ref 32–182)

## 2020-04-15 LAB — FERRITIN: Ferritin: 2 ng/mL — ABNORMAL LOW (ref 15–150)

## 2020-04-15 LAB — HIV ANTIBODY (ROUTINE TESTING W REFLEX): HIV Screen 4th Generation wRfx: NONREACTIVE

## 2020-04-15 LAB — RPR: RPR Ser Ql: NONREACTIVE

## 2020-04-15 LAB — FOLATE: Folate: 16.6 ng/mL (ref >3.0)

## 2020-04-15 LAB — TSH: TSH: 2.17 u[IU]/mL (ref 0.450–4.500)

## 2020-04-15 LAB — COPPER, SERUM: Copper: 165 ug/dL — ABNORMAL HIGH (ref 80–158)

## 2020-04-17 ENCOUNTER — Encounter: Payer: Self-pay | Admitting: Hematology

## 2020-04-17 ENCOUNTER — Inpatient Hospital Stay: Payer: Medicaid Other | Attending: Hematology | Admitting: Hematology

## 2020-04-17 ENCOUNTER — Other Ambulatory Visit: Payer: Self-pay

## 2020-04-17 VITALS — BP 127/76 | HR 92 | Temp 97.9°F | Resp 15 | Ht 61.5 in | Wt 235.4 lb

## 2020-04-17 DIAGNOSIS — Z836 Family history of other diseases of the respiratory system: Secondary | ICD-10-CM | POA: Insufficient documentation

## 2020-04-17 DIAGNOSIS — F17210 Nicotine dependence, cigarettes, uncomplicated: Secondary | ICD-10-CM | POA: Diagnosis not present

## 2020-04-17 DIAGNOSIS — Z841 Family history of disorders of kidney and ureter: Secondary | ICD-10-CM | POA: Diagnosis not present

## 2020-04-17 DIAGNOSIS — Z8349 Family history of other endocrine, nutritional and metabolic diseases: Secondary | ICD-10-CM | POA: Diagnosis not present

## 2020-04-17 DIAGNOSIS — Z818 Family history of other mental and behavioral disorders: Secondary | ICD-10-CM | POA: Diagnosis not present

## 2020-04-17 DIAGNOSIS — Z8614 Personal history of Methicillin resistant Staphylococcus aureus infection: Secondary | ICD-10-CM

## 2020-04-17 DIAGNOSIS — E785 Hyperlipidemia, unspecified: Secondary | ICD-10-CM | POA: Diagnosis not present

## 2020-04-17 DIAGNOSIS — F319 Bipolar disorder, unspecified: Secondary | ICD-10-CM | POA: Insufficient documentation

## 2020-04-17 DIAGNOSIS — Z79899 Other long term (current) drug therapy: Secondary | ICD-10-CM

## 2020-04-17 DIAGNOSIS — R531 Weakness: Secondary | ICD-10-CM | POA: Diagnosis not present

## 2020-04-17 DIAGNOSIS — F419 Anxiety disorder, unspecified: Secondary | ICD-10-CM | POA: Insufficient documentation

## 2020-04-17 DIAGNOSIS — D500 Iron deficiency anemia secondary to blood loss (chronic): Secondary | ICD-10-CM

## 2020-04-17 DIAGNOSIS — R252 Cramp and spasm: Secondary | ICD-10-CM | POA: Insufficient documentation

## 2020-04-17 DIAGNOSIS — Z823 Family history of stroke: Secondary | ICD-10-CM | POA: Insufficient documentation

## 2020-04-17 DIAGNOSIS — R5383 Other fatigue: Secondary | ICD-10-CM | POA: Diagnosis not present

## 2020-04-17 DIAGNOSIS — R42 Dizziness and giddiness: Secondary | ICD-10-CM

## 2020-04-17 DIAGNOSIS — Z811 Family history of alcohol abuse and dependence: Secondary | ICD-10-CM | POA: Diagnosis not present

## 2020-04-17 DIAGNOSIS — Z833 Family history of diabetes mellitus: Secondary | ICD-10-CM | POA: Insufficient documentation

## 2020-04-17 DIAGNOSIS — N920 Excessive and frequent menstruation with regular cycle: Secondary | ICD-10-CM | POA: Diagnosis not present
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
## 2020-04-18 ENCOUNTER — Telehealth: Payer: Self-pay | Admitting: Hematology

## 2020-04-18 NOTE — Telephone Encounter (Signed)
Left message with follow-up appointments per 2/7 los. Gave option to call back to reschedule if needed. 

## 2020-04-19 ENCOUNTER — Telehealth: Payer: Self-pay | Admitting: Hematology

## 2020-04-19 NOTE — Telephone Encounter (Signed)
Informed patient of her upcoming appointments. Patient is aware. 

## 2020-04-20 ENCOUNTER — Inpatient Hospital Stay: Payer: Medicaid Other

## 2020-04-20 ENCOUNTER — Other Ambulatory Visit: Payer: Self-pay | Admitting: Hematology

## 2020-04-20 ENCOUNTER — Other Ambulatory Visit: Payer: Self-pay

## 2020-04-20 VITALS — BP 116/60 | HR 82 | Temp 98.5°F | Resp 18

## 2020-04-20 DIAGNOSIS — D500 Iron deficiency anemia secondary to blood loss (chronic): Secondary | ICD-10-CM | POA: Diagnosis not present

## 2020-04-20 MED ORDER — SODIUM CHLORIDE 0.9 % IV SOLN
Freq: Once | INTRAVENOUS | Status: AC
  Filled 2020-04-20: qty 250

## 2020-04-20 MED ORDER — SODIUM CHLORIDE 0.9 % IV SOLN
200.0000 mg | Freq: Once | INTRAVENOUS | Status: AC
  Administered 2020-04-20: 200 mg via INTRAVENOUS
  Filled 2020-04-20: qty 200

## 2020-04-20 NOTE — Patient Instructions (Signed)

## 2020-04-22 ENCOUNTER — Ambulatory Visit: Payer: Medicaid Other

## 2020-04-24 ENCOUNTER — Ambulatory Visit: Payer: Medicaid Other

## 2020-04-26 ENCOUNTER — Inpatient Hospital Stay: Payer: Medicaid Other

## 2020-04-26 ENCOUNTER — Other Ambulatory Visit: Payer: Self-pay

## 2020-04-26 VITALS — BP 119/70 | HR 72 | Temp 98.6°F | Resp 18 | Wt 233.8 lb

## 2020-04-26 DIAGNOSIS — D500 Iron deficiency anemia secondary to blood loss (chronic): Secondary | ICD-10-CM

## 2020-04-26 MED ORDER — SODIUM CHLORIDE 0.9 % IV SOLN
200.0000 mg | Freq: Once | INTRAVENOUS | Status: AC
  Administered 2020-04-26: 200 mg via INTRAVENOUS
  Filled 2020-04-26: qty 200

## 2020-04-26 MED ORDER — SODIUM CHLORIDE 0.9 % IV SOLN
Freq: Once | INTRAVENOUS | Status: AC
  Filled 2020-04-26: qty 250

## 2020-04-26 NOTE — Patient Instructions (Signed)

## 2020-04-27 ENCOUNTER — Ambulatory Visit: Payer: Medicaid Other | Admitting: Obstetrics and Gynecology

## 2020-05-01 ENCOUNTER — Inpatient Hospital Stay: Payer: Medicaid Other

## 2020-05-05 ENCOUNTER — Inpatient Hospital Stay: Payer: Medicaid Other

## 2020-05-08 ENCOUNTER — Inpatient Hospital Stay: Payer: Medicaid Other

## 2020-05-08 ENCOUNTER — Other Ambulatory Visit: Payer: Self-pay

## 2020-05-08 VITALS — BP 123/67 | HR 68 | Temp 97.7°F | Resp 18 | Wt 237.5 lb

## 2020-05-08 DIAGNOSIS — D500 Iron deficiency anemia secondary to blood loss (chronic): Secondary | ICD-10-CM

## 2020-05-08 MED ORDER — SODIUM CHLORIDE 0.9 % IV SOLN
Freq: Once | INTRAVENOUS | Status: AC
  Filled 2020-05-08: qty 250

## 2020-05-08 MED ORDER — SODIUM CHLORIDE 0.9 % IV SOLN
400.0000 mg | Freq: Once | INTRAVENOUS | Status: AC
  Administered 2020-05-08: 400 mg via INTRAVENOUS
  Filled 2020-05-08: qty 20

## 2020-05-08 NOTE — Patient Instructions (Signed)
Iron Dextran injection °What is this medicine? °IRON DEXTRAN (AHY ern DEX tran) is an iron complex. Iron is used to make healthy red blood cells, which carry oxygen and nutrients through the body. This medicine is used to treat people who cannot take iron by mouth and have low levels of iron in the blood. °This medicine may be used for other purposes; ask your health care provider or pharmacist if you have questions. °COMMON BRAND NAME(S): Dexferrum, INFeD °What should I tell my health care provider before I take this medicine? °They need to know if you have any of these conditions: °· anemia not caused by low iron levels °· heart disease °· high levels of iron in the blood °· kidney disease °· liver disease °· an unusual or allergic reaction to iron, other medicines, foods, dyes, or preservatives °· pregnant or trying to get pregnant °· breast-feeding °How should I use this medicine? °This medicine is for injection into a vein or a muscle. It is given by a health care professional in a hospital or clinic setting. °Talk to your pediatrician regarding the use of this medicine in children. While this drug may be prescribed for children as young as 4 months old for selected conditions, precautions do apply. °Overdosage: If you think you have taken too much of this medicine contact a poison control center or emergency room at once. °NOTE: This medicine is only for you. Do not share this medicine with others. °What if I miss a dose? °It is important not to miss your dose. Call your doctor or health care professional if you are unable to keep an appointment. °What may interact with this medicine? °Do not take this medicine with any of the following medications: °· deferoxamine °· dimercaprol °· other iron products °This medicine may also interact with the following medications: °· chloramphenicol °· deferasirox °This list may not describe all possible interactions. Give your health care provider a list of all the  medicines, herbs, non-prescription drugs, or dietary supplements you use. Also tell them if you smoke, drink alcohol, or use illegal drugs. Some items may interact with your medicine. °What should I watch for while using this medicine? °Visit your doctor or health care professional regularly. Tell your doctor if your symptoms do not start to get better or if they get worse. You may need blood work done while you are taking this medicine. °You may need to follow a special diet. Talk to your doctor. Foods that contain iron include: whole grains/cereals, dried fruits, beans, or peas, leafy green vegetables, and organ meats (liver, kidney). °Long-term use of this medicine may increase your risk of some cancers. Talk to your doctor about how to limit your risk. °What side effects may I notice from receiving this medicine? °Side effects that you should report to your doctor or health care professional as soon as possible: °· allergic reactions like skin rash, itching or hives, swelling of the face, lips, or tongue °· blue lips, nails, or skin °· breathing problems °· changes in blood pressure °· chest pain °· confusion °· fast, irregular heartbeat °· feeling faint or lightheaded, falls °· fever or chills °· flushing, sweating, or hot feelings °· joint or muscle aches or pains °· pain, tingling, numbness in the hands or feet °· seizures °· unusually weak or tired °Side effects that usually do not require medical attention (report to your doctor or health care professional if they continue or are bothersome): °· change in taste (metallic taste) °·   diarrhea °· headache °· irritation at site where injected °· nausea, vomiting °· stomach upset °This list may not describe all possible side effects. Call your doctor for medical advice about side effects. You may report side effects to FDA at 1-800-FDA-1088. °Where should I keep my medicine? °This drug is given in a hospital or clinic and will not be stored at home. °NOTE: This  sheet is a summary. It may not cover all possible information. If you have questions about this medicine, talk to your doctor, pharmacist, or health care provider. °© 2021 [employer]/Gold Standard (2007-07-14 16:59:50) ° °

## 2020-05-08 NOTE — Progress Notes (Signed)
Increase Venofer to 400mg  per MD.  , RPH, BCPS, BCOP 05/08/2020 9:52 AM
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
## 2020-05-15 ENCOUNTER — Other Ambulatory Visit: Payer: Self-pay | Admitting: Hematology

## 2020-05-15 DIAGNOSIS — D500 Iron deficiency anemia secondary to blood loss (chronic): Secondary | ICD-10-CM

## 2020-05-16 ENCOUNTER — Encounter: Payer: Self-pay | Admitting: Advanced Practice Midwife

## 2020-05-16 ENCOUNTER — Other Ambulatory Visit: Payer: Self-pay

## 2020-05-16 ENCOUNTER — Ambulatory Visit (INDEPENDENT_AMBULATORY_CARE_PROVIDER_SITE_OTHER): Payer: Medicaid Other | Admitting: Advanced Practice Midwife

## 2020-05-16 ENCOUNTER — Other Ambulatory Visit (HOSPITAL_COMMUNITY)
Admission: RE | Admit: 2020-05-16 | Discharge: 2020-05-16 | Disposition: A | Discharge status: Home / Self Care | Payer: BLUE CROSS/BLUE SHIELD | Source: Ambulatory Visit | Attending: Obstetrics and Gynecology | Admitting: Obstetrics and Gynecology

## 2020-05-16 DIAGNOSIS — Z113 Encounter for screening for infections with a predominantly sexual mode of transmission: Secondary | ICD-10-CM | POA: Diagnosis not present

## 2020-05-16 DIAGNOSIS — Z1151 Encounter for screening for human papillomavirus (HPV): Secondary | ICD-10-CM | POA: Insufficient documentation

## 2020-05-16 DIAGNOSIS — Z01419 Encounter for gynecological examination (general) (routine) without abnormal findings: Secondary | ICD-10-CM

## 2020-05-16 DIAGNOSIS — A5901 Trichomonal vulvovaginitis: Secondary | ICD-10-CM | POA: Diagnosis not present

## 2020-05-16 DIAGNOSIS — N920 Excessive and frequent menstruation with regular cycle: Secondary | ICD-10-CM

## 2020-05-16 DIAGNOSIS — R87810 Cervical high risk human papillomavirus (HPV) DNA test positive: Secondary | ICD-10-CM | POA: Insufficient documentation

## 2020-05-16 DIAGNOSIS — R87613 High grade squamous intraepithelial lesion on cytologic smear of cervix (HGSIL): Secondary | ICD-10-CM | POA: Insufficient documentation

## 2020-05-16 DIAGNOSIS — D500 Iron deficiency anemia secondary to blood loss (chronic): Secondary | ICD-10-CM

## 2020-05-16 MED ORDER — MEGESTROL ACETATE 40 MG PO TABS: 40.0000 mg | ORAL_TABLET | Freq: Every day | ORAL | 0 refills | Status: AC

## 2020-05-16 NOTE — Progress Notes (Signed)
Pt presents today as new patient for yearly exam and to discuss issues with menorrhagia. Pt states she is having really heavy, painful cycles with clotting that usually come every 28-30 days. Pt states they are lasting 7-10 days and she is having to change her overnight pad every hour. She also complains of fatigue. She states this has been going on 6-8 months. Pt is unsure of last pap. She is not currently on anything for birth control. States she has been on OCPs, IUD which caused an allergic reaction, and Depo Provera which caused significant weight gain.

## 2020-05-16 NOTE — Patient Instructions (Addendum)
Megestrol tablets What is this medicine? MEGESTROL (me JES trol) belongs to a class of drugs known as progestins. Megestrol tablets are used to treat advanced breast or endometrial cancer. This medicine may be used for other purposes; ask your health care provider or pharmacist if you have questions. COMMON BRAND NAME(S): Megace What should I tell my health care provider before I take this medicine? They need to know if you have any of these conditions:  adrenal gland problems  history of blood clots of the legs, lungs, or other parts of the body  diabetes  kidney disease  liver disease  stroke  an unusual or allergic reaction to megestrol, other medicines, foods, dyes, or preservatives  pregnant or trying to get pregnant  breast-feeding How should I use this medicine? Take this medicine by mouth. Follow the directions on the prescription label. Do not take your medicine more often than directed. Take your doses at regular intervals. Do not stop taking except on the advice of your doctor or health care professional. Talk to your pediatrician regarding the use of this medicine in children. Special care may be needed. Overdosage: If you think you have taken too much of this medicine contact a poison control center or emergency room at once. NOTE: This medicine is only for you. Do not share this medicine with others. What if I miss a dose? If you miss a dose, take it as soon as you can. If it is almost time for your next dose, take only that dose. Do not take double or extra doses. What may interact with this medicine? Do not take this medicine with any of the following medications:  dofetilide This medicine may also interact with the following medications:  indinavir This list may not describe all possible interactions. Give your health care provider a list of all the medicines, herbs, non-prescription drugs, or dietary supplements you use. Also tell them if you smoke, drink  alcohol, or use illegal drugs. Some items may interact with your medicine. What should I watch for while using this medicine? Visit your doctor or health care professional for regular checks on your progress. Continue taking this medicine even if you feel better. It may take 2 months of regular use before you know if this medicine is working for your condition. This medicine can cause birth defects. Do not get pregnant while taking this drug. Females with child-bearing potential will need to have a negative pregnancy test before starting this medicine. Use an effective method of birth control while you are taking this medicine. If you think that you might be pregnant talk to your doctor right away. If you have diabetes, this medicine may affect blood sugar levels. Check your blood sugar and talk to your doctor or health care professional if you notice changes. What side effects may I notice from receiving this medicine? Side effects that you should report to your doctor or health care professional as soon as possible: Side effects that you should report to your doctor or health care professional as soon as possible:  allergic reactions like skin rash, itching or hives, swelling of the face, lips, or tongue  breathing problems  dizziness  increased blood pressure  signs and symptoms of a blood clot such as breathing problems; changes in vision; chest pain; severe, sudden headache; pain, swelling, warmth in the leg; trouble speaking; sudden numbness or weakness of the face, arm or leg  swelling of the ankles, feet, hands  unusually weak or tired    vomiting Side effects that usually do not require medical attention (report to your doctor or health care professional if they continue or are bothersome):  breakthrough menstrual bleeding  changes in sex drive or performance  diarrhea  gas  hot flashes or flushing  increased appetite  upset stomach  weight gain This list may not  describe all possible side effects. Call your doctor for medical advice about side effects. You may report side effects to FDA at 1-800-FDA-1088. Where should I keep my medicine? Keep out of the reach of children. Store at controlled room temperature between 15 and 30 degrees C (59 and 86 degrees F). Protect from heat above 40 degrees C (104 degrees F). Throw away any unused medicine after the expiration date. NOTE: This sheet is a summary. It may not cover all possible information. If you have questions about this medicine, talk to your doctor, pharmacist, or health care provider.  2021 Elsevier/Gold Standard (2017-03-05 10:07:34)   Abnormal Uterine Bleeding Abnormal uterine bleeding means bleeding more than usual from your womb (uterus). It can include:  Bleeding between menstrual periods.  Bleeding after sex.  Bleeding that is heavier than normal.  Menstrual periods that last longer than usual.  Bleeding after you have stopped having your menstrual period (menopause). There are many problems that may cause this. You should see a doctor for any kind of bleeding that is not normal. Treatment depends on the cause of the bleeding. Follow these instructions at home: Medicines  Take over-the-counter and prescription medicines only as told by your doctor.  Tell your doctor about other medicines that you take. ? If told by your doctor, stop taking aspirin or medicines that have aspirin in them. These medicines can make you bleed more.  You may be given iron pills to replace iron that your body loses because of this condition. Take them as told by your doctor. Managing constipation If you are taking iron pills, you may have trouble pooping (constipation). To prevent or treat trouble pooping, you may need to:  Drink enough fluid to keep your pee (urine) pale yellow.  Take over-the-counter or prescription medicines.  Eat foods that are high in fiber. These include beans, whole grains,  and fresh fruits and vegetables.  Limit foods that are high in fat and sugar. These include fried or sweet foods. General instructions  Watch your condition for any changes.  Do not use tampons, douche, or have sex, if your doctor tells you not to.  Change your pads often.  Get regular exams. This includes pelvic exams and cervical cancer screenings. ? It is up to you to get the results of any tests that are done. Ask your doctor, or the department that is doing the tests, when your results will be ready.  Keep all follow-up visits as told by your doctor. This is important. Contact a doctor if:  The bleeding lasts more than 1 week.  You feel dizzy at times.  You feel like you may vomit (nausea).  You vomit.  You feel light-headed or weak.  Your symptoms get worse. Get help right away if:  You pass out.  You have to change pads every hour.  You have pain in your belly.  You have a fever or chills.  You get sweaty.  You get weak.  You pass large blood clots from your vagina. Summary  Abnormal uterine bleeding means bleeding more than usual from your womb (uterus).  Any kind of bleeding that is not normal  should be checked by a doctor.  Treatment depends on the cause of the bleeding.  Get help right away if you pass out, you have to change pads every hour, or you pass large blood clots from your vagina. This information is not intended to replace advice given to you by your health care provider. Make sure you discuss any questions you have with your health care provider. Document Revised: 12/29/2018 Document Reviewed: 12/29/2018 Elsevier Patient Education  2021 [employer].

## 2020-05-17 ENCOUNTER — Encounter: Payer: Self-pay | Admitting: Hematology

## 2020-05-17 ENCOUNTER — Inpatient Hospital Stay (HOSPITAL_BASED_OUTPATIENT_CLINIC_OR_DEPARTMENT_OTHER): Payer: BLUE CROSS/BLUE SHIELD | Admitting: Hematology

## 2020-05-17 ENCOUNTER — Other Ambulatory Visit: Payer: Self-pay

## 2020-05-17 ENCOUNTER — Inpatient Hospital Stay: Payer: BLUE CROSS/BLUE SHIELD

## 2020-05-17 ENCOUNTER — Encounter: Payer: Self-pay | Admitting: Advanced Practice Midwife

## 2020-05-17 ENCOUNTER — Telehealth: Payer: Self-pay | Admitting: Hematology

## 2020-05-17 ENCOUNTER — Inpatient Hospital Stay: Payer: BLUE CROSS/BLUE SHIELD | Attending: Hematology

## 2020-05-17 VITALS — BP 115/55 | HR 72 | Temp 98.9°F | Resp 18

## 2020-05-17 VITALS — BP 126/53 | HR 72 | Temp 98.6°F | Resp 19 | Wt 235.1 lb

## 2020-05-17 DIAGNOSIS — R531 Weakness: Secondary | ICD-10-CM | POA: Insufficient documentation

## 2020-05-17 DIAGNOSIS — N920 Excessive and frequent menstruation with regular cycle: Secondary | ICD-10-CM | POA: Diagnosis not present

## 2020-05-17 DIAGNOSIS — D500 Iron deficiency anemia secondary to blood loss (chronic): Secondary | ICD-10-CM | POA: Insufficient documentation

## 2020-05-17 DIAGNOSIS — A599 Trichomoniasis, unspecified: Secondary | ICD-10-CM | POA: Insufficient documentation

## 2020-05-17 DIAGNOSIS — M79642 Pain in left hand: Secondary | ICD-10-CM | POA: Diagnosis not present

## 2020-05-17 DIAGNOSIS — Z79899 Other long term (current) drug therapy: Secondary | ICD-10-CM | POA: Insufficient documentation

## 2020-05-17 DIAGNOSIS — R252 Cramp and spasm: Secondary | ICD-10-CM | POA: Insufficient documentation

## 2020-05-17 DIAGNOSIS — F17210 Nicotine dependence, cigarettes, uncomplicated: Secondary | ICD-10-CM | POA: Insufficient documentation

## 2020-05-17 DIAGNOSIS — R5383 Other fatigue: Secondary | ICD-10-CM | POA: Insufficient documentation

## 2020-05-17 LAB — CBC WITH DIFFERENTIAL (CANCER CENTER ONLY)
Abs Immature Granulocytes: 0.01 10*3/uL (ref 0.00–0.07)
Basophils Absolute: 0.1 10*3/uL (ref 0.0–0.1)
Basophils Relative: 1 %
Eosinophils Absolute: 0.3 10*3/uL (ref 0.0–0.5)
Eosinophils Relative: 5 %
HCT: 34.1 % — ABNORMAL LOW (ref 36.0–46.0)
Hemoglobin: 9.4 g/dL — ABNORMAL LOW (ref 12.0–15.0)
Immature Granulocytes: 0 %
Lymphocytes Relative: 48 %
Lymphs Abs: 2.4 10*3/uL (ref 0.7–4.0)
MCH: 19.3 pg — ABNORMAL LOW (ref 26.0–34.0)
MCHC: 27.6 g/dL — ABNORMAL LOW (ref 30.0–36.0)
MCV: 70 fL — ABNORMAL LOW (ref 80.0–100.0)
Monocytes Absolute: 0.4 10*3/uL (ref 0.1–1.0)
Monocytes Relative: 7 %
Neutro Abs: 1.9 10*3/uL (ref 1.7–7.7)
Neutrophils Relative %: 39 %
Platelet Count: 261 10*3/uL (ref 150–400)
RBC: 4.87 MIL/uL (ref 3.87–5.11)
RDW: 30.1 % — ABNORMAL HIGH (ref 11.5–15.5)
WBC Count: 5 10*3/uL (ref 4.0–10.5)
nRBC: 0 % (ref 0.0–0.2)

## 2020-05-17 LAB — IRON AND TIBC
Iron: 38 ug/dL — ABNORMAL LOW (ref 41–142)
Saturation Ratios: 10 % — ABNORMAL LOW (ref 21–57)
TIBC: 399 ug/dL (ref 236–444)
UIBC: 360 ug/dL (ref 120–384)

## 2020-05-17 LAB — CERVICOVAGINAL ANCILLARY ONLY
Chlamydia: NEGATIVE
Comment: NEGATIVE
Comment: NEGATIVE
Comment: NORMAL
Neisseria Gonorrhea: NEGATIVE
Trichomonas: POSITIVE — AB

## 2020-05-17 LAB — FERRITIN: Ferritin: 47 ng/mL (ref 11–307)

## 2020-05-17 LAB — RETIC PANEL
Immature Retic Fract: 26.9 % — ABNORMAL HIGH (ref 2.3–15.9)
RBC.: 4.87 MIL/uL (ref 3.87–5.11)
Retic Count, Absolute: 77.4 10*3/uL (ref 19.0–186.0)
Retic Ct Pct: 1.6 % (ref 0.4–3.1)
Reticulocyte Hemoglobin: 28.5 pg (ref >27.9)

## 2020-05-17 MED ORDER — SODIUM CHLORIDE 0.9 % IV SOLN
400.0000 mg | Freq: Once | INTRAVENOUS | Status: AC
  Administered 2020-05-17: 400 mg via INTRAVENOUS
  Filled 2020-05-17: qty 20

## 2020-05-17 MED ORDER — HEPARIN SOD (PORK) LOCK FLUSH 100 UNIT/ML IV SOLN
250.0000 [IU] | Freq: Once | INTRAVENOUS | Status: DC | PRN
  Filled 2020-05-17: qty 5

## 2020-05-17 MED ORDER — SODIUM CHLORIDE 0.9 % IV SOLN
Freq: Once | INTRAVENOUS | Status: AC
  Filled 2020-05-17: qty 250

## 2020-05-17 MED ORDER — ALTEPLASE 2 MG IJ SOLR
2.0000 mg | Freq: Once | INTRAMUSCULAR | Status: DC | PRN
  Filled 2020-05-17: qty 2

## 2020-05-17 MED ORDER — SODIUM CHLORIDE 0.9% FLUSH
3.0000 mL | Freq: Once | INTRAVENOUS | Status: DC | PRN
  Filled 2020-05-17: qty 10

## 2020-05-17 MED ORDER — SODIUM CHLORIDE 0.9% FLUSH
10.0000 mL | Freq: Once | INTRAVENOUS | Status: DC | PRN
  Filled 2020-05-17: qty 10

## 2020-05-17 MED ORDER — HEPARIN SOD (PORK) LOCK FLUSH 100 UNIT/ML IV SOLN
500.0000 [IU] | Freq: Once | INTRAVENOUS | Status: DC | PRN
  Filled 2020-05-17: qty 5
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
## 2020-05-17 NOTE — Progress Notes (Signed)
Patient declined to stay for 30 minute post observation period. Upon discharge, vitals were stable and patient was in no distress.

## 2020-05-17 NOTE — Patient Instructions (Signed)

## 2020-05-17 NOTE — Telephone Encounter (Signed)
Scheduled per los. Declined printout  

## 2020-05-18 ENCOUNTER — Other Ambulatory Visit: Payer: Self-pay

## 2020-05-18 MED ORDER — METRONIDAZOLE 500 MG PO TABS: ORAL_TABLET | ORAL | 0 refills | Status: DC

## 2020-05-19 LAB — CYTOLOGY - PAP
Comment: NEGATIVE
Diagnosis: HIGH — AB
High risk HPV: POSITIVE — AB

## 2020-05-20 ENCOUNTER — Encounter (HOSPITAL_COMMUNITY): Payer: Self-pay

## 2020-05-20 ENCOUNTER — Ambulatory Visit (HOSPITAL_COMMUNITY)
Admission: EM | Admit: 2020-05-20 | Discharge: 2020-05-20 | Disposition: A | Discharge status: Home / Self Care | Payer: BLUE CROSS/BLUE SHIELD

## 2020-05-20 ENCOUNTER — Other Ambulatory Visit: Payer: Self-pay

## 2020-05-20 DIAGNOSIS — R1084 Generalized abdominal pain: Secondary | ICD-10-CM | POA: Diagnosis not present

## 2020-05-20 DIAGNOSIS — S39012A Strain of muscle, fascia and tendon of lower back, initial encounter: Secondary | ICD-10-CM | POA: Diagnosis not present

## 2020-05-20 NOTE — Discharge Instructions (Addendum)
For further evaluation of abdominal pain advise follow up in Emergency Department  For musculoskeletal discomfort recommend ibuprofen, ice to affected areas.
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
## 2020-05-22 ENCOUNTER — Inpatient Hospital Stay: Payer: BLUE CROSS/BLUE SHIELD

## 2020-05-23 ENCOUNTER — Encounter (HOSPITAL_COMMUNITY): Payer: Self-pay

## 2020-05-23 ENCOUNTER — Ambulatory Visit (HOSPITAL_COMMUNITY)
Admission: EM | Admit: 2020-05-23 | Discharge: 2020-05-23 | Disposition: A | Discharge status: Home / Self Care | Payer: BLUE CROSS/BLUE SHIELD | Attending: Family Medicine | Admitting: Family Medicine

## 2020-05-23 ENCOUNTER — Ambulatory Visit (INDEPENDENT_AMBULATORY_CARE_PROVIDER_SITE_OTHER): Payer: BLUE CROSS/BLUE SHIELD

## 2020-05-23 ENCOUNTER — Other Ambulatory Visit: Payer: Self-pay

## 2020-05-23 ENCOUNTER — Telehealth: Payer: Self-pay | Admitting: Hematology

## 2020-05-23 DIAGNOSIS — M549 Dorsalgia, unspecified: Secondary | ICD-10-CM | POA: Diagnosis not present

## 2020-05-23 DIAGNOSIS — S199XXA Unspecified injury of neck, initial encounter: Secondary | ICD-10-CM | POA: Diagnosis not present

## 2020-05-23 DIAGNOSIS — M545 Low back pain, unspecified: Secondary | ICD-10-CM

## 2020-05-23 MED ORDER — KETOROLAC TROMETHAMINE 60 MG/2ML IM SOLN
INTRAMUSCULAR | Status: AC
  Filled 2020-05-23: qty 2

## 2020-05-23 MED ORDER — PREDNISONE 20 MG PO TABS: 40.0000 mg | ORAL_TABLET | Freq: Every day | ORAL | 0 refills | Status: DC

## 2020-05-23 MED ORDER — METHOCARBAMOL 500 MG PO TABS: 500.0000 mg | ORAL_TABLET | Freq: Three times a day (TID) | ORAL | 0 refills | Status: DC | PRN

## 2020-05-23 MED ORDER — DEXAMETHASONE 1 MG/ML PO CONC
10.0000 mg | Freq: Once | ORAL | Status: AC
  Administered 2020-05-23: 10 mg via ORAL

## 2020-05-23 MED ORDER — DEXAMETHASONE SODIUM PHOSPHATE 10 MG/ML IJ SOLN
INTRAMUSCULAR | Status: AC
  Filled 2020-05-23: qty 1

## 2020-05-23 MED ORDER — KETOROLAC TROMETHAMINE 60 MG/2ML IM SOLN
60.0000 mg | Freq: Once | INTRAMUSCULAR | Status: AC
  Administered 2020-05-23: 60 mg via INTRAMUSCULAR
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
## 2020-05-23 NOTE — Discharge Instructions (Addendum)
If neck or back injury worsens prior to follow-up with orthopedic go immediately to the emergency department for further evaluation. Start prednisone 40 mg daily x 5 days Robaxin 500 mg 3 times daily as needed for neck and back pain.

## 2020-05-23 NOTE — Telephone Encounter (Signed)
Scheduled appts per 3/14 sch msg. Pt aware.  
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
## 2020-05-25 ENCOUNTER — Ambulatory Visit: Admission: RE | Admit: 2020-05-25 | Payer: BLUE CROSS/BLUE SHIELD | Source: Ambulatory Visit

## 2020-05-30 ENCOUNTER — Ambulatory Visit (HOSPITAL_COMMUNITY)
Admission: EM | Admit: 2020-05-30 | Discharge: 2020-05-30 | Disposition: A | Discharge status: Home / Self Care | Payer: BLUE CROSS/BLUE SHIELD | Attending: Family Medicine | Admitting: Family Medicine

## 2020-05-30 ENCOUNTER — Encounter (HOSPITAL_COMMUNITY): Payer: Self-pay | Admitting: Emergency Medicine

## 2020-05-30 ENCOUNTER — Other Ambulatory Visit: Payer: Self-pay

## 2020-05-30 DIAGNOSIS — M542 Cervicalgia: Secondary | ICD-10-CM

## 2020-05-30 MED ORDER — IBUPROFEN 800 MG PO TABS: 800.0000 mg | ORAL_TABLET | Freq: Three times a day (TID) | ORAL | 0 refills | Status: DC
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
## 2020-05-30 NOTE — Discharge Instructions (Addendum)
Keep your follow up appointment with the orthopaedist. I have written you out of work for 3 days. You may begin taking the muscle relaxer previously prescribed in addition to the high dose ibuprofen I have prescribed today.
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
## 2020-06-08 ENCOUNTER — Other Ambulatory Visit: Payer: Self-pay

## 2020-06-08 ENCOUNTER — Ambulatory Visit (INDEPENDENT_AMBULATORY_CARE_PROVIDER_SITE_OTHER): Payer: BLUE CROSS/BLUE SHIELD | Admitting: Obstetrics

## 2020-06-08 ENCOUNTER — Encounter: Payer: Self-pay | Admitting: Obstetrics

## 2020-06-08 ENCOUNTER — Inpatient Hospital Stay: Payer: BLUE CROSS/BLUE SHIELD

## 2020-06-08 ENCOUNTER — Other Ambulatory Visit (HOSPITAL_COMMUNITY)
Admission: RE | Admit: 2020-06-08 | Discharge: 2020-06-08 | Disposition: A | Discharge status: Home / Self Care | Payer: BLUE CROSS/BLUE SHIELD | Source: Ambulatory Visit | Attending: Obstetrics | Admitting: Obstetrics

## 2020-06-08 VITALS — BP 132/85 | HR 76 | Wt 242.0 lb

## 2020-06-08 DIAGNOSIS — R87613 High grade squamous intraepithelial lesion on cytologic smear of cervix (HGSIL): Secondary | ICD-10-CM

## 2020-06-08 NOTE — Progress Notes (Signed)
Colposcopy Procedure Note  Indications: Pap smear 1 months ago showed: high-grade squamous intraepithelial neoplasia  (HGSIL-encompassing moderate and severe dysplasia). The prior pap showed none on record.  Prior cervical/vaginal disease: unknown. Prior cervical treatment: no treatment.  Procedure Details  The risks and benefits of the procedure and Written informed consent obtained.  A time-out was performed confirming the patient, procedure and allergy status  Speculum placed in vagina and excellent visualization of cervix achieved, cervix swabbed x 3 with acetic acid solution.  Findings: Cervix: acetowhite lesion(s) noted at 12 o'clock and punctation noted at 12 o'clock; SCJ visualized - lesion at 12 o'clock, endocervical curettage performed, cervical biopsies taken at 12 o'clock, specimen labelled and sent to pathology and hemostasis achieved with silver nitrate.   Vaginal inspection: normal without visible lesions. Vulvar colposcopy: vulvar colposcopy not performed.   Physical Exam   Specimens: ECC and Cervical Biopsies  Complications: none.  Plan: Specimens labelled and sent to Pathology. Will base further treatment on Pathology findings. Treatment options discussed with patient. Post biopsy instructions given to patient. Return to discuss Pathology results in 2 weeks.  Brock Bad, MD 06/08/2020 3:21 PM

## 2020-06-08 NOTE — Progress Notes (Signed)
Pt was on cycle and was unable to get vaginal u/s. Pt is having occ spotting outside of cycle.   UPT in office is negative today.

## 2020-06-09 ENCOUNTER — Ambulatory Visit (HOSPITAL_COMMUNITY)
Admission: EM | Admit: 2020-06-09 | Discharge: 2020-06-09 | Disposition: A | Discharge status: Home / Self Care | Payer: BLUE CROSS/BLUE SHIELD | Attending: Emergency Medicine | Admitting: Emergency Medicine

## 2020-06-09 ENCOUNTER — Telehealth: Payer: Self-pay | Admitting: Hematology

## 2020-06-09 ENCOUNTER — Encounter (HOSPITAL_COMMUNITY): Payer: Self-pay

## 2020-06-09 DIAGNOSIS — S46001A Unspecified injury of muscle(s) and tendon(s) of the rotator cuff of right shoulder, initial encounter: Secondary | ICD-10-CM

## 2020-06-09 MED ORDER — TIZANIDINE HCL 4 MG PO TABS: 4.0000 mg | ORAL_TABLET | Freq: Three times a day (TID) | ORAL | 0 refills | Status: DC | PRN

## 2020-06-09 MED ORDER — IBUPROFEN 600 MG PO TABS: 600.0000 mg | ORAL_TABLET | Freq: Four times a day (QID) | ORAL | 0 refills | Status: DC | PRN

## 2020-06-09 MED ORDER — METHYLPREDNISOLONE 4 MG PO TBPK: ORAL_TABLET | Freq: Every day | ORAL | 0 refills | Status: DC

## 2020-06-09 NOTE — Telephone Encounter (Signed)
R/s appt per 3/31 sch msg. Called pt, no answer. Left msg with appt date and time. Also informed pt this appt will be at the [facility] location. Left my phone number for pt to call back with any questions.

## 2020-06-09 NOTE — Discharge Instructions (Signed)
Take 600 mg of ibuprofen with 1000 mg of Tylenol 3-4 times a day.  Zanaflex for muscle spasms.  Discontinue the Robaxin.  Finish the Medrol Dosepak, this was for inflammation, ice your shoulder, particularly after use.  Limited use/relative rest.

## 2020-06-09 NOTE — ED Triage Notes (Signed)
Pt in with c/o right shoulder blade pain that started yesterday.  Pt states she was holding her son and when she put him down and stood up straight it felt like she may have pulled a muscle   Pt states she took a muscle relaxer with minimal relief
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
## 2020-06-12 LAB — SURGICAL PATHOLOGY

## 2020-06-16 ENCOUNTER — Ambulatory Visit: Payer: BLUE CROSS/BLUE SHIELD

## 2020-06-19 ENCOUNTER — Inpatient Hospital Stay: Payer: BLUE CROSS/BLUE SHIELD | Attending: Hematology

## 2020-06-20 ENCOUNTER — Ambulatory Visit: Payer: BLUE CROSS/BLUE SHIELD | Attending: Advanced Practice Midwife

## 2020-06-22 ENCOUNTER — Telehealth: Payer: BLUE CROSS/BLUE SHIELD | Admitting: Obstetrics

## 2020-06-22 DIAGNOSIS — Z01419 Encounter for gynecological examination (general) (routine) without abnormal findings: Secondary | ICD-10-CM

## 2020-06-23 NOTE — Progress Notes (Signed)
Patient did not answer phone

## 2020-06-24 ENCOUNTER — Ambulatory Visit (INDEPENDENT_AMBULATORY_CARE_PROVIDER_SITE_OTHER)
Admission: EM | Admit: 2020-06-24 | Discharge: 2020-06-24 | Disposition: A | Discharge status: Home / Self Care | Payer: BLUE CROSS/BLUE SHIELD | Source: Home / Self Care

## 2020-06-24 ENCOUNTER — Encounter (HOSPITAL_COMMUNITY): Payer: Self-pay | Admitting: Emergency Medicine

## 2020-06-24 ENCOUNTER — Emergency Department (HOSPITAL_COMMUNITY)
Admission: EM | Admit: 2020-06-24 | Discharge: 2020-06-25 | Disposition: A | Discharge status: Home / Self Care | Payer: BLUE CROSS/BLUE SHIELD | Attending: Emergency Medicine | Admitting: Emergency Medicine

## 2020-06-24 ENCOUNTER — Other Ambulatory Visit: Payer: Self-pay

## 2020-06-24 DIAGNOSIS — Z91040 Latex allergy status: Secondary | ICD-10-CM | POA: Diagnosis not present

## 2020-06-24 DIAGNOSIS — Z3202 Encounter for pregnancy test, result negative: Secondary | ICD-10-CM | POA: Diagnosis not present

## 2020-06-24 DIAGNOSIS — F17210 Nicotine dependence, cigarettes, uncomplicated: Secondary | ICD-10-CM | POA: Diagnosis not present

## 2020-06-24 DIAGNOSIS — R1084 Generalized abdominal pain: Secondary | ICD-10-CM

## 2020-06-24 DIAGNOSIS — R109 Unspecified abdominal pain: Secondary | ICD-10-CM | POA: Diagnosis present

## 2020-06-24 DIAGNOSIS — K7689 Other specified diseases of liver: Secondary | ICD-10-CM | POA: Diagnosis not present

## 2020-06-24 DIAGNOSIS — R1031 Right lower quadrant pain: Secondary | ICD-10-CM | POA: Diagnosis not present

## 2020-06-24 DIAGNOSIS — K769 Liver disease, unspecified: Secondary | ICD-10-CM

## 2020-06-24 LAB — URINALYSIS, ROUTINE W REFLEX MICROSCOPIC
Bacteria, UA: NONE SEEN
Bilirubin Urine: NEGATIVE
Glucose, UA: NEGATIVE mg/dL
Hgb urine dipstick: NEGATIVE
Ketones, ur: NEGATIVE mg/dL
Nitrite: NEGATIVE
Protein, ur: NEGATIVE mg/dL
Specific Gravity, Urine: 1.024 (ref 1.005–1.030)
pH: 5 (ref 5.0–8.0)

## 2020-06-24 LAB — COMPREHENSIVE METABOLIC PANEL
ALT: 18 U/L (ref 0–44)
AST: 17 U/L (ref 15–41)
Albumin: 3.9 g/dL (ref 3.5–5.0)
Alkaline Phosphatase: 62 U/L (ref 38–126)
Anion gap: 8 (ref 5–15)
BUN: 8 mg/dL (ref 6–20)
CO2: 21 mmol/L — ABNORMAL LOW (ref 22–32)
Calcium: 9.2 mg/dL (ref 8.9–10.3)
Chloride: 106 mmol/L (ref 98–111)
Creatinine, Ser: 0.6 mg/dL (ref 0.44–1.00)
GFR, Estimated: >60 mL/min (ref >60)
Glucose, Bld: 99 mg/dL (ref 70–99)
Potassium: 4.4 mmol/L (ref 3.5–5.1)
Sodium: 135 mmol/L (ref 135–145)
Total Bilirubin: 0.3 mg/dL (ref 0.3–1.2)
Total Protein: 6.9 g/dL (ref 6.5–8.1)

## 2020-06-24 LAB — POCT URINALYSIS DIPSTICK, ED / UC
Bilirubin Urine: NEGATIVE
Glucose, UA: NEGATIVE mg/dL
Hgb urine dipstick: NEGATIVE
Leukocytes,Ua: NEGATIVE
Nitrite: NEGATIVE
Protein, ur: NEGATIVE mg/dL
Specific Gravity, Urine: 1.025 (ref 1.005–1.030)
Urobilinogen, UA: 0.2 mg/dL (ref 0.0–1.0)
pH: 7 (ref 5.0–8.0)

## 2020-06-24 LAB — CBC
HCT: 42 % (ref 36.0–46.0)
Hemoglobin: 12.2 g/dL (ref 12.0–15.0)
MCH: 22.8 pg — ABNORMAL LOW (ref 26.0–34.0)
MCHC: 29 g/dL — ABNORMAL LOW (ref 30.0–36.0)
MCV: 78.5 fL — ABNORMAL LOW (ref 80.0–100.0)
Platelets: 301 10*3/uL (ref 150–400)
RBC: 5.35 MIL/uL — ABNORMAL HIGH (ref 3.87–5.11)
RDW: 27.4 % — ABNORMAL HIGH (ref 11.5–15.5)
WBC: 7.8 10*3/uL (ref 4.0–10.5)
nRBC: 0 % (ref 0.0–0.2)

## 2020-06-24 LAB — I-STAT BETA HCG BLOOD, ED (MC, WL, AP ONLY): I-stat hCG, quantitative: <5 m[IU]/mL (ref <5)

## 2020-06-24 LAB — LIPASE, BLOOD: Lipase: 41 U/L (ref 11–51)

## 2020-06-24 LAB — POC URINE PREG, ED: Preg Test, Ur: NEGATIVE

## 2020-06-24 NOTE — ED Triage Notes (Signed)
C/o R sided abd pain and nausea that started 1 hour ago.

## 2020-06-24 NOTE — ED Notes (Signed)
Patient is being discharged from the Urgent Care and sent to the Emergency Department via POV. Per Audry Pili PA, patient is in need of higher level of care for further testing. Patient is aware and verbalizes understanding of plan of care.  Vitals:   06/24/20 1627  BP: (!) 141/74  Pulse: 79  Resp: 18  Temp: 98.8 F (37.1 C)  SpO2: 100%

## 2020-06-24 NOTE — ED Notes (Signed)
Have pt call Morrie Sheldon when she has a moment please

## 2020-06-24 NOTE — ED Triage Notes (Signed)
Emergency Medicine Provider Triage Evaluation Note  Amanda Church , a 35 y.o. female  was evaluated in triage.  Pt complains of abdominal pain, nausea, symptoms began after using the bathroom today.  No vomiting.  No pelvic complaints.  Review of Systems  Positive: Abdominal pain, nausea Negative: Vomiting, Vaginal discharge  Physical Exam  BP 136/81 (BP Location: Left Arm)   Pulse 83   Temp 98.1 F (36.7 C) (Oral)   Resp 20   LMP 05/21/2020   SpO2 100%  Gen:   Awake, no distress   HEENT:  Atraumatic  Resp:  Normal effort  Cardiac:  Normal rate  Abd:   Nondistended, right upper quadrant, right lower quadrant pain periumbilical tenderness to palpation without rebound or guarding MSK:   Moves extremities without difficulty  Neuro:  Speech clear   Medical Decision Making  Medically screening exam initiated at 5:44 PM.  Appropriate orders placed.  Amanda Church was informed that the remainder of the evaluation will be completed by another provider, this initial triage assessment does not replace that evaluation, and the importance of remaining in the ED until their evaluation is complete.  Clinical Impression  We will obtain lab work, UA, HCG   Dietrich Pates, [place] 06/24/20 1748

## 2020-06-24 NOTE — Discharge Instructions (Addendum)
Please go directly to the emergency room to rule out appendicitis or ovarian abnormality. Nothing to eat or drink until told it is ok by ER staff
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
## 2020-06-24 NOTE — ED Triage Notes (Signed)
Pt is present today with abdominal pain that's started today. Pt states that the abdominal pain is generalized.

## 2020-06-25 ENCOUNTER — Emergency Department (HOSPITAL_COMMUNITY): Payer: BLUE CROSS/BLUE SHIELD

## 2020-06-25 DIAGNOSIS — K7689 Other specified diseases of liver: Secondary | ICD-10-CM | POA: Diagnosis not present

## 2020-06-25 LAB — URINE CULTURE: Culture: >=100000 — AB

## 2020-06-25 MED ORDER — IOHEXOL 300 MG/ML  SOLN
100.0000 mL | Freq: Once | INTRAMUSCULAR | Status: AC | PRN
  Administered 2020-06-25: 100 mL via INTRAVENOUS
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
## 2020-06-29 ENCOUNTER — Encounter (HOSPITAL_COMMUNITY): Payer: Self-pay

## 2020-06-29 ENCOUNTER — Emergency Department (HOSPITAL_COMMUNITY)
Admission: EM | Admit: 2020-06-29 | Discharge: 2020-06-30 | Disposition: A | Discharge status: Home / Self Care | Payer: BLUE CROSS/BLUE SHIELD | Attending: Emergency Medicine | Admitting: Emergency Medicine

## 2020-06-29 ENCOUNTER — Other Ambulatory Visit: Payer: Self-pay

## 2020-06-29 DIAGNOSIS — R110 Nausea: Secondary | ICD-10-CM | POA: Insufficient documentation

## 2020-06-29 DIAGNOSIS — R109 Unspecified abdominal pain: Secondary | ICD-10-CM | POA: Insufficient documentation

## 2020-06-29 DIAGNOSIS — Z5321 Procedure and treatment not carried out due to patient leaving prior to being seen by health care provider: Secondary | ICD-10-CM | POA: Diagnosis not present

## 2020-06-29 LAB — CBC WITH DIFFERENTIAL/PLATELET
Abs Immature Granulocytes: 0.02 10*3/uL (ref 0.00–0.07)
Basophils Absolute: 0.1 10*3/uL (ref 0.0–0.1)
Basophils Relative: 1 %
Eosinophils Absolute: 0.2 10*3/uL (ref 0.0–0.5)
Eosinophils Relative: 4 %
HCT: 43.1 % (ref 36.0–46.0)
Hemoglobin: 12.4 g/dL (ref 12.0–15.0)
Immature Granulocytes: 0 %
Lymphocytes Relative: 45 %
Lymphs Abs: 2.6 10*3/uL (ref 0.7–4.0)
MCH: 22.7 pg — ABNORMAL LOW (ref 26.0–34.0)
MCHC: 28.8 g/dL — ABNORMAL LOW (ref 30.0–36.0)
MCV: 78.8 fL — ABNORMAL LOW (ref 80.0–100.0)
Monocytes Absolute: 0.4 10*3/uL (ref 0.1–1.0)
Monocytes Relative: 7 %
Neutro Abs: 2.5 10*3/uL (ref 1.7–7.7)
Neutrophils Relative %: 43 %
Platelets: 313 10*3/uL (ref 150–400)
RBC: 5.47 MIL/uL — ABNORMAL HIGH (ref 3.87–5.11)
RDW: 26.7 % — ABNORMAL HIGH (ref 11.5–15.5)
WBC: 5.8 10*3/uL (ref 4.0–10.5)
nRBC: 0 % (ref 0.0–0.2)

## 2020-06-29 LAB — COMPREHENSIVE METABOLIC PANEL
ALT: 17 U/L (ref 0–44)
AST: 17 U/L (ref 15–41)
Albumin: 3.9 g/dL (ref 3.5–5.0)
Alkaline Phosphatase: 55 U/L (ref 38–126)
Anion gap: 7 (ref 5–15)
BUN: 8 mg/dL (ref 6–20)
CO2: 26 mmol/L (ref 22–32)
Calcium: 9.1 mg/dL (ref 8.9–10.3)
Chloride: 103 mmol/L (ref 98–111)
Creatinine, Ser: 0.69 mg/dL (ref 0.44–1.00)
GFR, Estimated: >60 mL/min (ref >60)
Glucose, Bld: 111 mg/dL — ABNORMAL HIGH (ref 70–99)
Potassium: 4.5 mmol/L (ref 3.5–5.1)
Sodium: 136 mmol/L (ref 135–145)
Total Bilirubin: 0.4 mg/dL (ref 0.3–1.2)
Total Protein: 7.1 g/dL (ref 6.5–8.1)

## 2020-06-29 LAB — URINALYSIS, ROUTINE W REFLEX MICROSCOPIC
Bilirubin Urine: NEGATIVE
Glucose, UA: NEGATIVE mg/dL
Hgb urine dipstick: NEGATIVE
Ketones, ur: NEGATIVE mg/dL
Leukocytes,Ua: NEGATIVE
Nitrite: NEGATIVE
Protein, ur: NEGATIVE mg/dL
Specific Gravity, Urine: 1.026 (ref 1.005–1.030)
pH: 5 (ref 5.0–8.0)

## 2020-06-29 LAB — LIPASE, BLOOD: Lipase: 38 U/L (ref 11–51)

## 2020-06-29 NOTE — ED Notes (Signed)
Patient left on own accord °

## 2020-06-29 NOTE — ED Triage Notes (Signed)
Pt bib [facility]. Pt c/o abdominal pain, recently seen for same. Has had nausea, denies vomiting. Pt denies urinary symptoms, diarrhea or constipation.

## 2020-06-29 NOTE — ED Triage Notes (Signed)
Emergency Medicine Provider Triage Evaluation Note  ALYIA LACERTE , a 35 y.o. female  was evaluated in triage.  Pt complains of abdominal pain, brought in by EMS. Seen recently, diagnosed with liver lesion, followed up with PCP who has ordered an MRI. Pain onset Sunday. Ongoing pain, nausea. No vomiting, changes in bowel/bladder habits. Constant pain, severity waxes and wanes. Stabbing in nature. Worse with movement. Improves with holding still.   Review of Systems  Positive: Abdominal pain, nausea  Negative: Changes in bowel or bladder habits  Physical Exam  BP 120/75 (BP Location: Right Arm)   Pulse 83   Temp 98.2 F (36.8 C) (Oral)   Resp 18   SpO2 98%  Gen:   Awake, no distress   HEENT:  Atraumatic  Resp:  Normal effort  Cardiac:  Normal rate  Abd:   Nondistended, no tenderness MSK:   Moves extremities without difficulty  Neuro:  Speech clear   Medical Decision Making  Medically screening exam initiated at 9:23 PM.  Appropriate orders placed.  LAZARIA SCHABEN was informed that the remainder of the evaluation will be completed by another provider, this initial triage assessment does not replace that evaluation, and the importance of remaining in the ED until their evaluation is complete.  Clinical Impression     Alden Hipp 06/29/20 2145

## 2020-07-05 ENCOUNTER — Other Ambulatory Visit: Payer: Self-pay | Admitting: Internal Medicine

## 2020-07-05 ENCOUNTER — Other Ambulatory Visit: Payer: Self-pay | Admitting: Physician Assistant

## 2020-07-05 DIAGNOSIS — K769 Liver disease, unspecified: Secondary | ICD-10-CM

## 2020-07-13 ENCOUNTER — Ambulatory Visit: Payer: Medicaid Other | Admitting: Neurology

## 2020-07-13 ENCOUNTER — Encounter: Payer: Self-pay | Admitting: Neurology

## 2020-07-13 VITALS — BP 117/75 | HR 81 | Ht 61.0 in | Wt 235.0 lb

## 2020-07-13 DIAGNOSIS — D500 Iron deficiency anemia secondary to blood loss (chronic): Secondary | ICD-10-CM

## 2020-07-13 DIAGNOSIS — G43709 Chronic migraine without aura, not intractable, without status migrainosus: Secondary | ICD-10-CM

## 2020-07-13 MED ORDER — NORTRIPTYLINE HCL 25 MG PO CAPS: 25.0000 mg | ORAL_CAPSULE | Freq: Every day | ORAL | 5 refills | Status: DC
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
## 2020-07-13 NOTE — Patient Instructions (Signed)
Continue nortriptyline to help headaches Call when ready to schedule MRI lumbar spine See you back in 6 months

## 2020-07-16 ENCOUNTER — Other Ambulatory Visit: Payer: Self-pay

## 2020-07-16 ENCOUNTER — Ambulatory Visit (HOSPITAL_COMMUNITY)
Admission: EM | Admit: 2020-07-16 | Discharge: 2020-07-16 | Disposition: A | Discharge status: Home / Self Care | Payer: BLUE CROSS/BLUE SHIELD | Attending: Student | Admitting: Student

## 2020-07-16 ENCOUNTER — Encounter (HOSPITAL_COMMUNITY): Payer: Self-pay | Admitting: *Deleted

## 2020-07-16 DIAGNOSIS — J111 Influenza due to unidentified influenza virus with other respiratory manifestations: Secondary | ICD-10-CM

## 2020-07-16 DIAGNOSIS — Z1152 Encounter for screening for COVID-19: Secondary | ICD-10-CM | POA: Diagnosis present

## 2020-07-16 LAB — POC INFLUENZA A AND B ANTIGEN (URGENT CARE ONLY)
INFLUENZA A ANTIGEN, POC: NEGATIVE
INFLUENZA B ANTIGEN, POC: NEGATIVE

## 2020-07-16 LAB — SARS CORONAVIRUS 2 (TAT 6-24 HRS): SARS Coronavirus 2: NEGATIVE

## 2020-07-16 MED ORDER — BENZONATATE 100 MG PO CAPS: 100.0000 mg | ORAL_CAPSULE | Freq: Three times a day (TID) | ORAL | 0 refills | Status: DC

## 2020-07-16 MED ORDER — LIDOCAINE VISCOUS HCL 2 % MT SOLN: 15.0000 mL | OROMUCOSAL | 0 refills | Status: DC | PRN

## 2020-07-16 MED ORDER — ACETAMINOPHEN 325 MG PO TABS
975.0000 mg | ORAL_TABLET | Freq: Once | ORAL | Status: AC
  Administered 2020-07-16: 975 mg via ORAL

## 2020-07-16 MED ORDER — PROMETHAZINE-DM 6.25-15 MG/5ML PO SYRP: 5.0000 mL | ORAL_SOLUTION | Freq: Four times a day (QID) | ORAL | 0 refills | Status: DC | PRN

## 2020-07-16 MED ORDER — ACETAMINOPHEN 325 MG PO TABS
ORAL_TABLET | ORAL | Status: AC
  Filled 2020-07-16: qty 3

## 2020-07-16 NOTE — ED Triage Notes (Signed)
Pt reports Sx's started yesterday and today. Pt has a sore throat,chils,generalized body aches.Pt unsure if she has had a fever.
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
## 2020-07-16 NOTE — Discharge Instructions (Addendum)
-  Promethazine DM cough syrup for congestion/cough. This could make you drowsy, so take at night before bed. -For sore throat, use lidocaine mouthwash up to every 4 hours. Make sure not to eat for at least 1 hour after using this, as your mouth will be very numb and you could bite yourself. -Tessalon (Benzonatate) as needed for cough. Take one pill up to 3x daily (every 8 hours) -For fevers/chills, body aches, headaches- Take Tylenol 1000 mg 3 times daily, and ibuprofen 800 mg 3 times daily with food.  You can take these together, or alternate every 3-4 hours. -Make sure to drink plenty of fluids and get plenty of rest. -With a virus, you're typically contagious for 5-7 days, or as long as you're having fevers.  -Seek additional medical attention if you develop new symptoms like chest pain, shortness of breath.

## 2020-07-17 ENCOUNTER — Inpatient Hospital Stay: Payer: BLUE CROSS/BLUE SHIELD | Attending: Hematology

## 2020-07-27 ENCOUNTER — Ambulatory Visit
Admission: RE | Admit: 2020-07-27 | Discharge: 2020-07-27 | Disposition: A | Discharge status: Home / Self Care | Payer: BLUE CROSS/BLUE SHIELD | Source: Ambulatory Visit | Attending: Physician Assistant | Admitting: Physician Assistant

## 2020-07-27 ENCOUNTER — Other Ambulatory Visit: Payer: Self-pay

## 2020-07-27 DIAGNOSIS — K769 Liver disease, unspecified: Secondary | ICD-10-CM

## 2020-07-27 MED ORDER — GADOBENATE DIMEGLUMINE 529 MG/ML IV SOLN
20.0000 mL | Freq: Once | INTRAVENOUS | Status: AC | PRN
  Administered 2020-07-27: 20 mL via INTRAVENOUS

## 2020-08-21 ENCOUNTER — Inpatient Hospital Stay: Payer: BLUE CROSS/BLUE SHIELD

## 2020-08-21 ENCOUNTER — Inpatient Hospital Stay: Payer: BLUE CROSS/BLUE SHIELD | Attending: Hematology | Admitting: Hematology

## 2020-09-13 ENCOUNTER — Other Ambulatory Visit: Payer: Self-pay

## 2020-09-13 ENCOUNTER — Encounter (HOSPITAL_COMMUNITY): Payer: Self-pay | Admitting: *Deleted

## 2020-09-13 ENCOUNTER — Ambulatory Visit (HOSPITAL_COMMUNITY)
Admission: EM | Admit: 2020-09-13 | Discharge: 2020-09-13 | Disposition: A | Discharge status: Home / Self Care | Payer: BLUE CROSS/BLUE SHIELD | Attending: Internal Medicine | Admitting: Internal Medicine

## 2020-09-13 DIAGNOSIS — R3915 Urgency of urination: Secondary | ICD-10-CM | POA: Insufficient documentation

## 2020-09-13 DIAGNOSIS — R1030 Lower abdominal pain, unspecified: Secondary | ICD-10-CM | POA: Diagnosis not present

## 2020-09-13 DIAGNOSIS — R350 Frequency of micturition: Secondary | ICD-10-CM | POA: Insufficient documentation

## 2020-09-13 DIAGNOSIS — R8290 Unspecified abnormal findings in urine: Secondary | ICD-10-CM | POA: Diagnosis not present

## 2020-09-13 LAB — POC URINE PREG, ED: Preg Test, Ur: NEGATIVE

## 2020-09-13 LAB — POCT URINALYSIS DIPSTICK, ED / UC
Bilirubin Urine: NEGATIVE
Glucose, UA: NEGATIVE mg/dL
Hgb urine dipstick: NEGATIVE
Ketones, ur: NEGATIVE mg/dL
Nitrite: NEGATIVE
Protein, ur: NEGATIVE mg/dL
Specific Gravity, Urine: >=1.03 (ref 1.005–1.030)
Urobilinogen, UA: 0.2 mg/dL (ref 0.0–1.0)
pH: 5.5 (ref 5.0–8.0)

## 2020-09-13 MED ORDER — SULFAMETHOXAZOLE-TRIMETHOPRIM 800-160 MG PO TABS: 1.0000 | ORAL_TABLET | Freq: Two times a day (BID) | ORAL | 0 refills | Status: AC

## 2020-09-13 NOTE — Discharge Instructions (Addendum)
Urine pregnancy test was negative in office today.  I am concerned you are beginning to get a urinary tract infection.  I have called in an antibiotic that you will take twice a day for 7 days.  If you develop any skin rash or mouth lesions you need to stop the medication to be reevaluated.  I will send your urine off for culture and if we need to change antibiotics we will contact you.  If you have any worsening symptoms you need to be reevaluated.  If you have any sudden severe abdominal pain you should go to the emergency room to obtain imaging since we are not capable of this in urgent care.

## 2020-09-13 NOTE — ED Triage Notes (Signed)
PT reports Lt groin pain that started this AM. No other Sx's.
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
## 2020-09-15 LAB — URINE CULTURE: Culture: >=100000 — AB

## 2020-10-14 IMAGING — DX DG FOOT COMPLETE 3+V*L*
3 series · 3 of 3 positions shown · non-contrast
Comparison: None.

CLINICAL DATA: Left foot pain.

EXAM:
LEFT FOOT - COMPLETE 3+ VIEW

[foot ap]
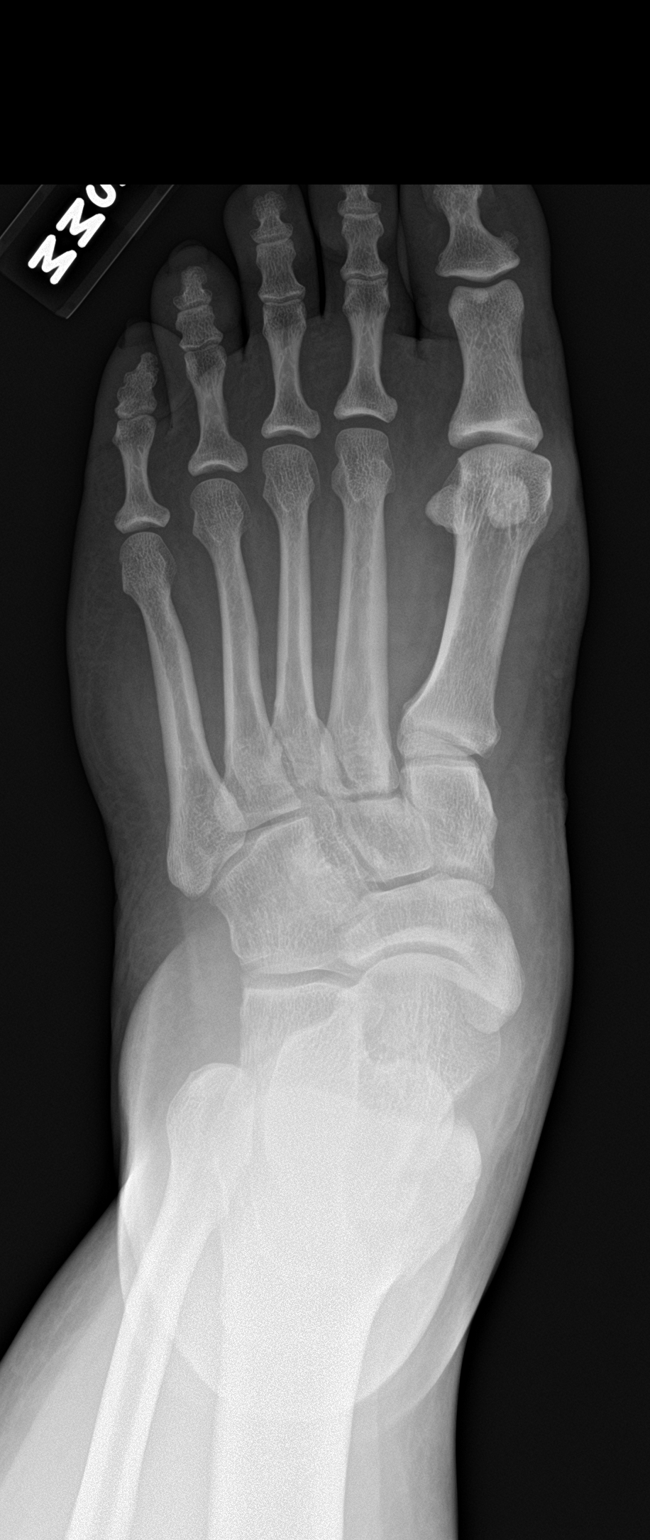

[foot obl]
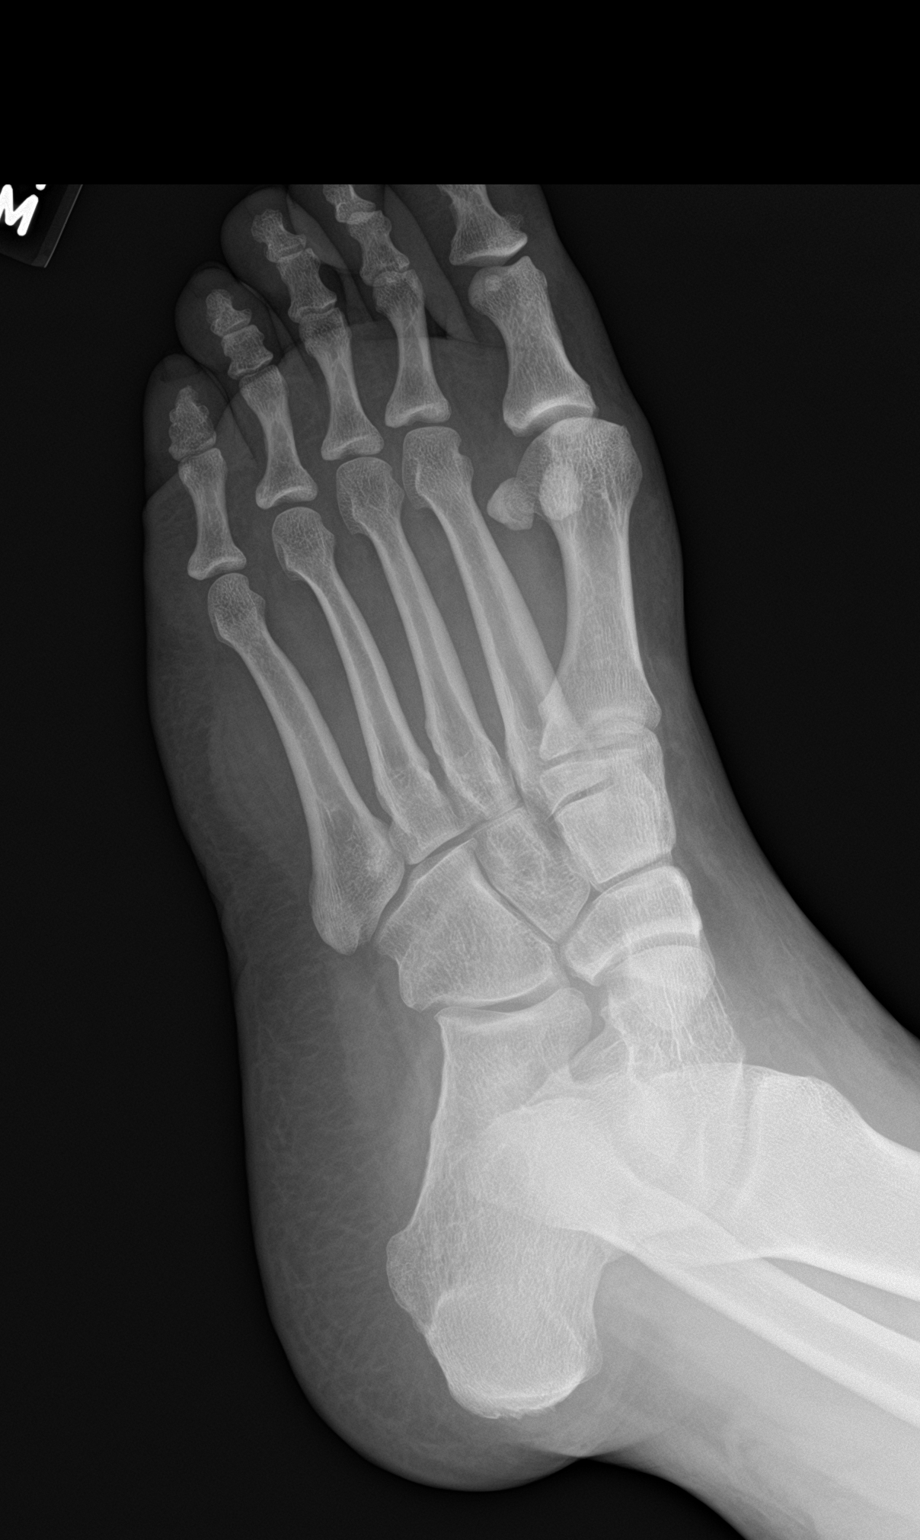

[foot lat]
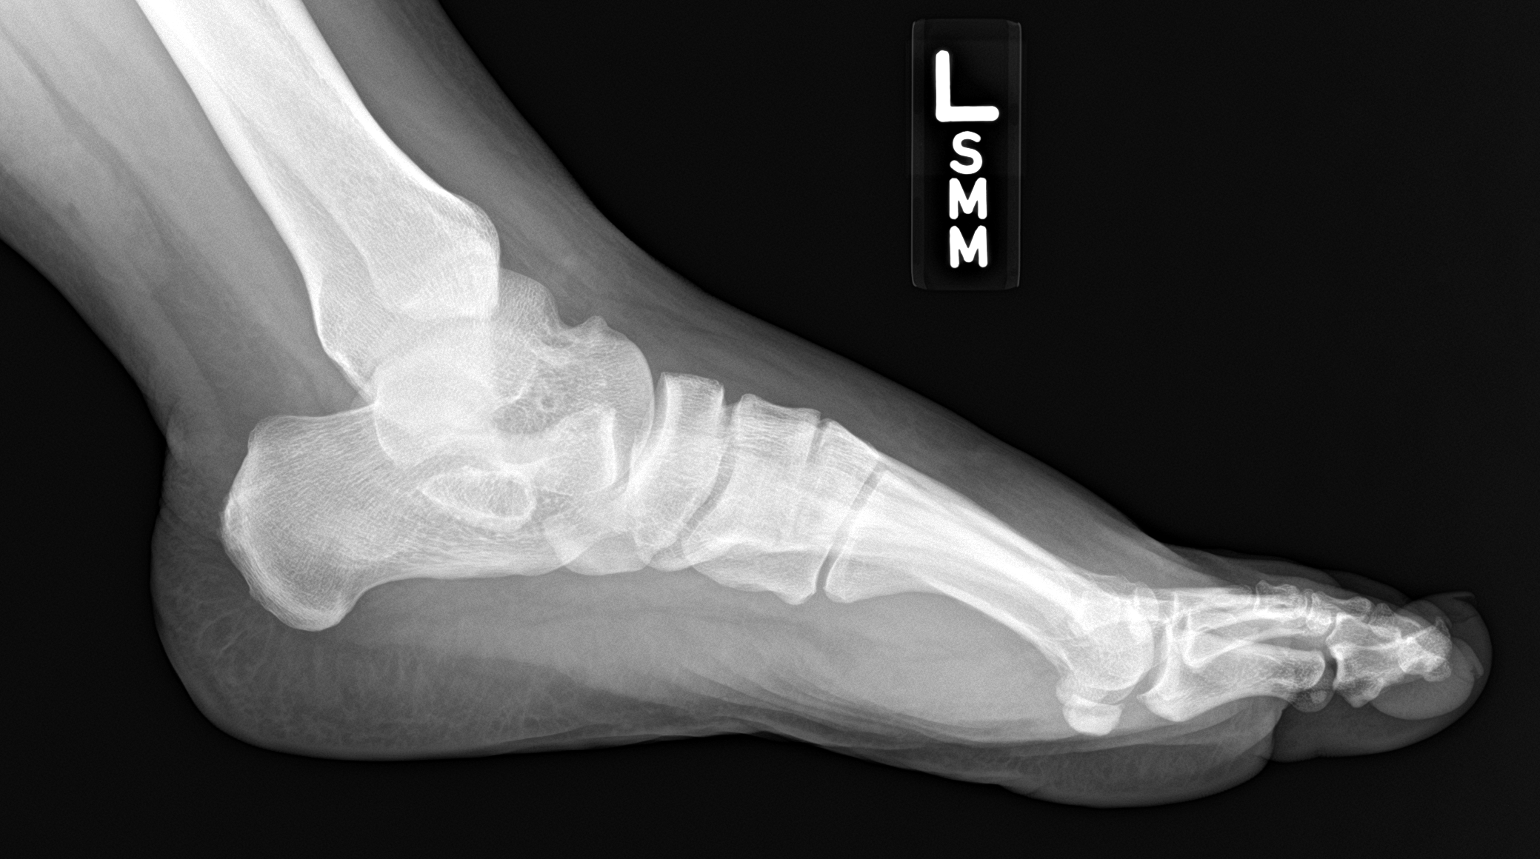

[3 of 3 positions shown; findings below may reference images not displayed]

FINDINGS: There is no evidence of fracture or dislocation. There is no
evidence of arthropathy or other focal bone abnormality. Soft
tissues are unremarkable.
IMPRESSION: Negative.

## 2020-12-22 ENCOUNTER — Ambulatory Visit (HOSPITAL_COMMUNITY)
Admission: EM | Admit: 2020-12-22 | Discharge: 2020-12-22 | Disposition: A | Discharge status: Home / Self Care | Payer: Medicaid Other | Attending: Internal Medicine | Admitting: Internal Medicine

## 2020-12-22 ENCOUNTER — Encounter (HOSPITAL_COMMUNITY): Payer: Self-pay

## 2020-12-22 ENCOUNTER — Encounter: Payer: Self-pay | Admitting: Hematology

## 2020-12-22 DIAGNOSIS — N898 Other specified noninflammatory disorders of vagina: Secondary | ICD-10-CM | POA: Insufficient documentation

## 2020-12-22 DIAGNOSIS — Z3201 Encounter for pregnancy test, result positive: Secondary | ICD-10-CM | POA: Insufficient documentation

## 2020-12-22 LAB — POC URINE PREG, ED: Preg Test, Ur: POSITIVE — AB

## 2020-12-22 NOTE — ED Triage Notes (Signed)
Pt c/o vaginal irritation with slight odor [id number]. States is month late on her menstrual cycle. Denies urinary sx's or vaginal discharge.
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
## 2020-12-25 LAB — CERVICOVAGINAL ANCILLARY ONLY
Bacterial Vaginitis (gardnerella): POSITIVE — AB
Candida Glabrata: NEGATIVE
Candida Vaginitis: POSITIVE — AB
Chlamydia: NEGATIVE
Comment: NEGATIVE
Comment: NEGATIVE
Comment: NEGATIVE
Comment: NEGATIVE
Comment: NEGATIVE
Comment: NORMAL
Neisseria Gonorrhea: NEGATIVE
Trichomonas: POSITIVE — AB

## 2020-12-26 ENCOUNTER — Telehealth (HOSPITAL_COMMUNITY): Payer: Self-pay | Admitting: Emergency Medicine

## 2020-12-26 MED ORDER — CLOTRIMAZOLE 1 % VA CREA: 1.0000 | TOPICAL_CREAM | Freq: Every day | VAGINAL | 0 refills | Status: DC

## 2020-12-26 MED ORDER — METRONIDAZOLE 500 MG PO TABS
500.0000 mg | ORAL_TABLET | Freq: Two times a day (BID) | ORAL | 0 refills | Status: DC
Start: 2020-12-26 — End: 2020-12-27

## 2020-12-27 ENCOUNTER — Encounter (HOSPITAL_COMMUNITY): Payer: Self-pay

## 2020-12-27 ENCOUNTER — Other Ambulatory Visit: Payer: Self-pay

## 2020-12-27 ENCOUNTER — Ambulatory Visit (HOSPITAL_COMMUNITY)
Admission: EM | Admit: 2020-12-27 | Discharge: 2020-12-27 | Disposition: A | Discharge status: Home / Self Care | Payer: Medicaid Other | Attending: Emergency Medicine | Admitting: Emergency Medicine

## 2020-12-27 DIAGNOSIS — R6883 Chills (without fever): Secondary | ICD-10-CM

## 2020-12-27 DIAGNOSIS — Z20822 Contact with and (suspected) exposure to covid-19: Secondary | ICD-10-CM | POA: Insufficient documentation

## 2020-12-27 DIAGNOSIS — R519 Headache, unspecified: Secondary | ICD-10-CM

## 2020-12-27 DIAGNOSIS — R6889 Other general symptoms and signs: Secondary | ICD-10-CM | POA: Diagnosis not present

## 2020-12-27 DIAGNOSIS — J069 Acute upper respiratory infection, unspecified: Secondary | ICD-10-CM

## 2020-12-27 LAB — POC INFLUENZA A AND B ANTIGEN (URGENT CARE ONLY)
INFLUENZA A ANTIGEN, POC: NEGATIVE
INFLUENZA B ANTIGEN, POC: NEGATIVE
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
## 2020-12-27 NOTE — ED Triage Notes (Signed)
Pt reports headache, body aches and chills since this morning.

## 2020-12-27 NOTE — Discharge Instructions (Signed)
Your flu test today is negative.  The COVID test results should be available in the next 6 to 12 hours, you will be contacted with his results and further instructions as needed.  I recommend that you continue to quarantine at home until you feel better without medication.  Conservative care is recommended at this time with Tylenol, ibuprofen, clear fluids and rest.

## 2020-12-28 LAB — SARS CORONAVIRUS 2 (TAT 6-24 HRS): SARS Coronavirus 2: NEGATIVE

## 2021-01-12 ENCOUNTER — Ambulatory Visit (HOSPITAL_COMMUNITY)
Admission: EM | Admit: 2021-01-12 | Discharge: 2021-01-12 | Disposition: A | Discharge status: Home / Self Care | Payer: Medicaid Other | Attending: Emergency Medicine | Admitting: Emergency Medicine

## 2021-01-12 ENCOUNTER — Encounter (HOSPITAL_COMMUNITY): Payer: Self-pay

## 2021-01-12 ENCOUNTER — Other Ambulatory Visit: Payer: Self-pay

## 2021-01-12 DIAGNOSIS — Z20822 Contact with and (suspected) exposure to covid-19: Secondary | ICD-10-CM | POA: Diagnosis present

## 2021-01-12 DIAGNOSIS — J069 Acute upper respiratory infection, unspecified: Secondary | ICD-10-CM | POA: Diagnosis present

## 2021-01-12 LAB — POC INFLUENZA A AND B ANTIGEN (URGENT CARE ONLY)
INFLUENZA A ANTIGEN, POC: NEGATIVE
INFLUENZA B ANTIGEN, POC: NEGATIVE

## 2021-01-12 LAB — SARS CORONAVIRUS 2 (TAT 6-24 HRS): SARS Coronavirus 2: NEGATIVE

## 2021-01-12 MED ORDER — FLUTICASONE PROPIONATE 50 MCG/ACT NA SUSP: 2.0000 | Freq: Every day | NASAL | 0 refills | Status: DC
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
## 2021-01-12 NOTE — ED Triage Notes (Signed)
Pt presents with nasal congestion, cough, body aches, sore throat that began two days ago

## 2021-01-12 NOTE — Discharge Instructions (Addendum)
Flonase, saline nasal irrigation with a [facility] Med rinse and distilled water as often as you want, 1000 mg of Tylenol 3-4 times a day, plain Mucinex.  We will contact you if your COVID is positive and will treat you appropriately.  Your flu is negative.

## 2021-01-29 ENCOUNTER — Ambulatory Visit: Payer: Medicaid Other | Admitting: Neurology

## 2021-01-31 ENCOUNTER — Telehealth (INDEPENDENT_AMBULATORY_CARE_PROVIDER_SITE_OTHER): Payer: Medicaid Other

## 2021-01-31 ENCOUNTER — Other Ambulatory Visit: Payer: Self-pay

## 2021-01-31 DIAGNOSIS — Z8632 Personal history of gestational diabetes: Secondary | ICD-10-CM

## 2021-01-31 DIAGNOSIS — O0990 Supervision of high risk pregnancy, unspecified, unspecified trimester: Secondary | ICD-10-CM | POA: Insufficient documentation

## 2021-01-31 DIAGNOSIS — F419 Anxiety disorder, unspecified: Secondary | ICD-10-CM

## 2021-01-31 DIAGNOSIS — O09299 Supervision of pregnancy with other poor reproductive or obstetric history, unspecified trimester: Secondary | ICD-10-CM | POA: Insufficient documentation

## 2021-01-31 DIAGNOSIS — Z3A00 Weeks of gestation of pregnancy not specified: Secondary | ICD-10-CM

## 2021-01-31 DIAGNOSIS — Z3491 Encounter for supervision of normal pregnancy, unspecified, first trimester: Secondary | ICD-10-CM

## 2021-01-31 DIAGNOSIS — Z3480 Encounter for supervision of other normal pregnancy, unspecified trimester: Secondary | ICD-10-CM

## 2021-01-31 MED ORDER — PRENATAL VITAMIN 27-0.8 MG PO TABS: 1.0000 | ORAL_TABLET | Freq: Every day | ORAL | 11 refills | Status: DC

## 2021-01-31 NOTE — Progress Notes (Signed)
New OB Intake  I connected with  Amanda Church on 01/31/21 at 0925 by MyChart Video Visit and verified that I am speaking with the correct person using two identifiers. Nurse is located at [facility] and pt is located at home.  I discussed the limitations, risks, security and privacy concerns of performing an evaluation and management service by telephone and the availability of in person appointments. I also discussed with the patient that there may be a patient responsible charge related to this service. The patient expressed understanding and agreed to proceed.  I explained I am completing New OB Intake today. We discussed her EDD of 08/15/21 that is based on approximate LMP of 11/08/20. Patient reports 3-4 day period of bleeding at end of August, beginning of September time period. Pt reports this was unlike her normal period. Recommended dating ultrasound for confirmation, will schedule with MFM due to gestational age. Pt is G5/P4004. I reviewed her allergies, medications, Medical/Surgical/OB history, and appropriate screenings. I informed her of [facility] services. Patient desires appt with [facility] for anxiety management, referral placed. Based on history, this is a low risk pregnancy.   Patient Active Problem List   Diagnosis Date Noted   History of gestational diabetes in prior pregnancy, currently pregnant 01/31/2021   Supervision of low-risk pregnancy, first trimester 01/31/2021   Anxiety 01/31/2021   Iron deficiency anemia 04/14/2020   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   Incontinence of feces 04/13/2020   Chronic migraine w/o aura w/o status migrainosus, not intractable 04/13/2020   Anemia 04/13/2020   Previous cesarean delivery affecting pregnancy 10/10/2016   HGSIL (high grade squamous intraepithelial lesion) on Pap smear of cervix 10/10/2016   Concerns addressed today VBAC: Patient strongly desires vaginal delivery. Last 2 deliveries were via c-section.  Abnormal  PAP history: Patient reports she had abnormal colposcopy earlier this year (per chart review on 06/08/20). Result showed HGSIL on cervical biopsy and ECC. Explained to patient she may need follow up for this during pregnancy and that her provider will review. Possible HSV: Patient reports positive test at some point in her history, but no history of outbreak.  Delivery Plans:  Plans to deliver at [facility].   MyChart/Babyscripts MyChart access verified. I explained pt will have some visits in office and some virtually. Babyscripts instructions given and order placed. Patient verifies receipt of registration text/e-mail. Account successfully created and app downloaded.  Blood Pressure Cuff  Has BP cuff. Explained after first prenatal appt pt will check weekly and document in Babyscripts.  Weight scale: Patient has access to weight scale.  Anatomy US Dating [place] scheduled with MFM.  Labs Discussed Avelina Laine genetic screening with patient. Would like both Panorama and Horizon drawn at new OB visit. Routine prenatal labs needed. Will need early 2 HR GTT.  COVID Vaccine Patient has covid vaccine.   Centering in Pregnancy Candidate?  No-- by LMP patient is too late in pregnancy for Centering Pregnancy.  Mother/ Baby Dyad Candidate?    No-- 6 living children.  Social Determinants of Health Food Insecurity: Patient denies food insecurity. WIC Referral: Patient is enrolled in [facility].  Transportation: Patient denies transportation needs. Childcare: Discussed no children allowed at ultrasound appointments. Offered childcare services; patient declines childcare services at this time.  First visit review I reviewed new OB appt with pt. I explained she will have provider visit with physical exam and ob labs. Explained pt will be seen by Donavan Foil, MD at first visit; encounter routed  to appropriate provider. Explained that patient will be seen by pregnancy navigator following visit with provider.   Marjo Bicker, RN 01/31/2021  3:18 PM

## 2021-02-02 ENCOUNTER — Other Ambulatory Visit: Payer: Self-pay

## 2021-02-02 ENCOUNTER — Emergency Department (HOSPITAL_COMMUNITY)
Admission: EM | Admit: 2021-02-02 | Discharge: 2021-02-02 | Disposition: A | Discharge status: Home / Self Care | Payer: Medicaid Other | Attending: Emergency Medicine | Admitting: Emergency Medicine

## 2021-02-02 ENCOUNTER — Encounter: Payer: Self-pay | Admitting: Emergency Medicine

## 2021-02-02 ENCOUNTER — Encounter (HOSPITAL_COMMUNITY): Payer: Self-pay

## 2021-02-02 ENCOUNTER — Ambulatory Visit
Admission: EM | Admit: 2021-02-02 | Discharge: 2021-02-02 | Disposition: A | Discharge status: Home / Self Care | Payer: Medicaid Other | Attending: Physician Assistant | Admitting: Physician Assistant

## 2021-02-02 DIAGNOSIS — Z3491 Encounter for supervision of normal pregnancy, unspecified, first trimester: Secondary | ICD-10-CM

## 2021-02-02 DIAGNOSIS — K529 Noninfective gastroenteritis and colitis, unspecified: Secondary | ICD-10-CM

## 2021-02-02 DIAGNOSIS — O99611 Diseases of the digestive system complicating pregnancy, first trimester: Secondary | ICD-10-CM | POA: Diagnosis present

## 2021-02-02 DIAGNOSIS — Z20822 Contact with and (suspected) exposure to covid-19: Secondary | ICD-10-CM | POA: Insufficient documentation

## 2021-02-02 DIAGNOSIS — N9489 Other specified conditions associated with female genital organs and menstrual cycle: Secondary | ICD-10-CM | POA: Diagnosis not present

## 2021-02-02 DIAGNOSIS — R000 Tachycardia, unspecified: Secondary | ICD-10-CM | POA: Diagnosis not present

## 2021-02-02 DIAGNOSIS — R112 Nausea with vomiting, unspecified: Secondary | ICD-10-CM

## 2021-02-02 LAB — CBC WITH DIFFERENTIAL/PLATELET
Abs Immature Granulocytes: 0.02 10*3/uL (ref 0.00–0.07)
Basophils Absolute: 0 10*3/uL (ref 0.0–0.1)
Basophils Relative: 0 %
Eosinophils Absolute: 0 10*3/uL (ref 0.0–0.5)
Eosinophils Relative: 0 %
HCT: 34.4 % — ABNORMAL LOW (ref 36.0–46.0)
Hemoglobin: 10.1 g/dL — ABNORMAL LOW (ref 12.0–15.0)
Immature Granulocytes: 0 %
Lymphocytes Relative: 10 %
Lymphs Abs: 0.7 10*3/uL (ref 0.7–4.0)
MCH: 20.2 pg — ABNORMAL LOW (ref 26.0–34.0)
MCHC: 29.4 g/dL — ABNORMAL LOW (ref 30.0–36.0)
MCV: 68.9 fL — ABNORMAL LOW (ref 80.0–100.0)
Monocytes Absolute: 0.3 10*3/uL (ref 0.1–1.0)
Monocytes Relative: 4 %
Neutro Abs: 5.6 10*3/uL (ref 1.7–7.7)
Neutrophils Relative %: 86 %
Platelets: 315 10*3/uL (ref 150–400)
RBC: 4.99 MIL/uL (ref 3.87–5.11)
RDW: 21.2 % — ABNORMAL HIGH (ref 11.5–15.5)
Smear Review: NORMAL
WBC: 6.5 10*3/uL (ref 4.0–10.5)
nRBC: 0 % (ref 0.0–0.2)

## 2021-02-02 LAB — COMPREHENSIVE METABOLIC PANEL
ALT: 13 U/L (ref 0–44)
AST: 18 U/L (ref 15–41)
Albumin: 3.5 g/dL (ref 3.5–5.0)
Alkaline Phosphatase: 65 U/L (ref 38–126)
Anion gap: 10 (ref 5–15)
BUN: 5 mg/dL — ABNORMAL LOW (ref 6–20)
CO2: 20 mmol/L — ABNORMAL LOW (ref 22–32)
Calcium: 8.9 mg/dL (ref 8.9–10.3)
Chloride: 102 mmol/L (ref 98–111)
Creatinine, Ser: 0.58 mg/dL (ref 0.44–1.00)
GFR, Estimated: >60 mL/min (ref >60)
Glucose, Bld: 106 mg/dL — ABNORMAL HIGH (ref 70–99)
Potassium: 3.7 mmol/L (ref 3.5–5.1)
Sodium: 132 mmol/L — ABNORMAL LOW (ref 135–145)
Total Bilirubin: 0.7 mg/dL (ref 0.3–1.2)
Total Protein: 7 g/dL (ref 6.5–8.1)

## 2021-02-02 LAB — RESP PANEL BY RT-PCR (FLU A&B, COVID) ARPGX2
Influenza A by PCR: NEGATIVE
Influenza B by PCR: NEGATIVE
SARS Coronavirus 2 by RT PCR: NEGATIVE

## 2021-02-02 LAB — HCG, QUANTITATIVE, PREGNANCY: hCG, Beta Chain, Quant, S: 70391 m[IU]/mL — ABNORMAL HIGH (ref <5)

## 2021-02-02 MED ORDER — LACTATED RINGERS IV BOLUS
1000.0000 mL | Freq: Once | INTRAVENOUS | Status: AC
  Administered 2021-02-02: 1000 mL via INTRAVENOUS

## 2021-02-02 MED ORDER — ONDANSETRON 4 MG PO TBDP
4.0000 mg | ORAL_TABLET | Freq: Once | ORAL | Status: AC
  Administered 2021-02-02: 4 mg via ORAL

## 2021-02-02 MED ORDER — ACETAMINOPHEN 325 MG PO TABS
650.0000 mg | ORAL_TABLET | Freq: Once | ORAL | Status: AC
  Administered 2021-02-02: 650 mg via ORAL
  Filled 2021-02-02: qty 2

## 2021-02-02 MED ORDER — ONDANSETRON HCL 4 MG PO TABS: 4.0000 mg | ORAL_TABLET | Freq: Four times a day (QID) | ORAL | 0 refills | Status: DC

## 2021-02-02 MED ORDER — METOCLOPRAMIDE HCL 5 MG/ML IJ SOLN
10.0000 mg | Freq: Once | INTRAMUSCULAR | Status: AC
  Administered 2021-02-02: 10 mg via INTRAVENOUS
  Filled 2021-02-02: qty 2
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
## 2021-02-02 NOTE — ED Triage Notes (Signed)
Children have been sick since Tuesday. C/o stomach pain, vomiting, diarrhea

## 2021-02-02 NOTE — ED Provider Notes (Signed)
Patient here today with son and daughter for evaluation of vomiting and diarrhea that she has had for the last 3 days.  Zofran given in office.  Attention quickly shifted to the her son who is less responsive, discussed continuing care for mom and daughter while awaiting EMS and mom declines.   Tomi Bamberger, PA-C 02/02/21 [phone]

## 2021-02-02 NOTE — ED Triage Notes (Signed)
Pt from home for ongoing vomiting and diarrhea patient also stating that she's been having chills. Pt stated that she's been around sick children. Pt stated that symptoms started this morning. Pt stated that she also has abdominal pain on the right side and pain is worse that precedes loose stools and vomiting.

## 2021-02-02 NOTE — ED Provider Notes (Addendum)
Emergency Medicine Provider Triage Evaluation Note  Amanda Church , a 35 y.o. female  was evaluated in triage.  Pt complains of nausea, vomiting, diarrhea, body aches that started yesterday, children sick at home. Patient is ~[redacted] weeks pregnant, LMP 11/08/20.  Review of Systems  Positive: Nausea, vomiting, diarrhea, shortness of breath, fatigue, body Negative: 's chest pain, palpitations  Physical Exam  BP (!) 143/71 (BP Location: Right Arm)   Pulse (!) 112   Temp 99 F (37.2 C) (Oral)   Resp 18   Ht 5\' 1"  (1.549 m)   Wt 104.3 kg   LMP 11/08/2020   SpO2 96%   BMI 43.46 kg/m  Gen:   Awake, no distress   Resp:  Normal effort  MSK:   Moves extremities without difficulty  Other:  Abdomen soft, nondistended, tender in the lower quadrants  Medical Decision Making  Medically screening exam initiated at 6:48 PM.  Appropriate orders placed.  Amanda Church was informed that the remainder of the evaluation will be completed by another provider, this initial triage assessment does not replace that evaluation, and the importance of remaining in the ED until their evaluation is complete.  This chart was dictated using voice recognition software, Dragon. Despite the best efforts of this provider to proofread and correct errors, errors may still occur which can change documentation meaning.    Ardeth Perfect, PA-C 02/02/21 1900    Amanda Church, 02/04/21 02/02/21 1900    02/04/21, MD 02/02/21 2028

## 2021-02-02 NOTE — Discharge Instructions (Signed)
Baby looks good today.  Labs are good.  You most likely have the stomach bug from your kids.  Take tylenol as needed for body aches and use nausea medication as needed.  Make sure you are drinking plenty of fluids and  resting.  Return if you can't hold anything down, start having vaginal bleeding or have severe abdominal pain.

## 2021-02-05 NOTE — Progress Notes (Signed)
Patient was assessed and managed by nursing staff during this encounter. I have reviewed the chart and agree with the documentation and plan. I have also made any necessary editorial changes.  Warden Fillers, MD 02/05/2021 9:18 AM

## 2021-02-07 NOTE — BH Specialist Note (Signed)
Integrated Behavioral Health via Telemedicine Visit  02/07/2021 [name] [id number]  Number of Integrated Behavioral Health visits: 1 Session Start time: 2:21  Session End time: 2:47 Total time:  26  Referring Provider: Mariel Aloe, MD Patient/Family location: Home [facility] Provider location: Center for [facility]  All persons participating in visit: Patient Amanda Church and [facility]   Types of Service: Individual psychotherapy and Video visit  I connected with Ardeth Perfect and/or Coralyn Helling Reddy's  n/a  via  Telephone or Video Enabled Telemedicine Application  (Video is Caregility application) and verified that I am speaking with the correct person using two identifiers. Discussed confidentiality: Yes   I discussed the limitations of telemedicine and the availability of in person appointments.  Discussed there is a possibility of technology failure and discussed alternative modes of communication if that failure occurs.  I discussed that engaging in this telemedicine visit, they consent to the provision of behavioral healthcare and the services will be billed under their insurance.  Patient and/or legal guardian expressed understanding and consented to Telemedicine visit: Yes   Presenting Concerns: Patient and/or family reports the following symptoms/concerns: anxiety with panic and anhedonia in current pregnancy; pt's goal is to prevent escalation of symptoms through pregnancy and postpartum; copes by breathing through panicky moments; prefers no BH medication.  Duration of problem: Increase current pregnancy; ongoing; Severity of problem: mild  Patient and/or Family's Strengths/Protective Factors: Concrete supports in place (healthy food, safe environments, etc.) and Sense of purpose  Goals Addressed: Patient will:  Reduce symptoms of: anxiety, depression, and stress   Increase knowledge and/or ability of:  healthy habits   Demonstrate ability to: Increase healthy adjustment to current life circumstances and Increase motivation to adhere to plan of care  Progress towards Goals: Ongoing  Interventions: Interventions utilized:  Motivational Interviewing and Psychoeducation and/or Health Education Standardized Assessments completed:  PHQ9/GAD7 given within past two weeks  Patient and/or Family Response: Pt agrees with treatment plan  Assessment: Patient currently experiencing Adjustment disorder with mixed anxious and depressed mood.   Patient may benefit from psychoeducation and brief therapeutic interventions regarding coping with symptoms of anxiety, depression, life stress .  Plan: Follow up with behavioral health clinician on : Three weeks Behavioral recommendations:  Publishing copy (again) to get switched to Pregnancy Medicaid -Begin taking prenatal vitamins as prescribed, as soon as able (while waiting to pick up, eat healthy diet, including iron-rich food daily) -Continue breathing through moments of anxiety with panic; Read educational materials regarding coping with symptoms of anxiety with panic (on After Visit Summary)  Referral(s): Integrated [employer] (In Clinic)  I discussed the assessment and treatment plan with the patient and/or parent/guardian. They were provided an opportunity to ask questions and all were answered. They agreed with the plan and demonstrated an understanding of the instructions.   They were advised to call back or seek an in-person evaluation if the symptoms worsen or if the condition fails to improve as anticipated.  Rae Lips, LCSW  Depression screen [facility] 2/9 02/15/2021 01/31/2021  Decreased Interest 1 0  Down, Depressed, Hopeless 0 0  PHQ - 2 Score 1 0  Altered sleeping 1 1  Tired, decreased energy 2 1  Change in appetite 0 1  Feeling bad or failure about yourself  0 0  Trouble concentrating 0 0  Moving slowly  or fidgety/restless 0 0  Suicidal thoughts 0 0  PHQ-9 Score  4 3   GAD 7 : Generalized Anxiety Score 02/15/2021 01/31/2021  Nervous, Anxious, on Edge 1 0  Control/stop worrying 0 0  Worry too much - different things 1 3  Trouble relaxing 0 0  Restless 1 0  Easily annoyed or irritable 1 3  Afraid - awful might happen 0 0  Total GAD 7 Score 4 6

## 2021-02-12 ENCOUNTER — Other Ambulatory Visit: Payer: Self-pay | Admitting: *Deleted

## 2021-02-12 DIAGNOSIS — Z3491 Encounter for supervision of normal pregnancy, unspecified, first trimester: Secondary | ICD-10-CM

## 2021-02-12 DIAGNOSIS — O09299 Supervision of pregnancy with other poor reproductive or obstetric history, unspecified trimester: Secondary | ICD-10-CM

## 2021-02-15 ENCOUNTER — Other Ambulatory Visit: Payer: Medicaid Other

## 2021-02-15 ENCOUNTER — Other Ambulatory Visit: Payer: Self-pay

## 2021-02-15 ENCOUNTER — Encounter: Payer: Self-pay | Admitting: Obstetrics and Gynecology

## 2021-02-15 ENCOUNTER — Ambulatory Visit (INDEPENDENT_AMBULATORY_CARE_PROVIDER_SITE_OTHER): Payer: Medicaid Other | Admitting: Obstetrics and Gynecology

## 2021-02-15 ENCOUNTER — Other Ambulatory Visit (HOSPITAL_COMMUNITY)
Admission: RE | Admit: 2021-02-15 | Discharge: 2021-02-15 | Disposition: A | Discharge status: Home / Self Care | Payer: Medicaid Other | Source: Ambulatory Visit | Attending: Obstetrics and Gynecology | Admitting: Obstetrics and Gynecology

## 2021-02-15 VITALS — BP 125/78 | HR 94 | Wt 246.9 lb

## 2021-02-15 DIAGNOSIS — O09299 Supervision of pregnancy with other poor reproductive or obstetric history, unspecified trimester: Secondary | ICD-10-CM

## 2021-02-15 DIAGNOSIS — Z3491 Encounter for supervision of normal pregnancy, unspecified, first trimester: Secondary | ICD-10-CM | POA: Diagnosis present

## 2021-02-15 DIAGNOSIS — Z9889 Other specified postprocedural states: Secondary | ICD-10-CM

## 2021-02-15 DIAGNOSIS — R87613 High grade squamous intraepithelial lesion on cytologic smear of cervix (HGSIL): Secondary | ICD-10-CM

## 2021-02-15 DIAGNOSIS — O0990 Supervision of high risk pregnancy, unspecified, unspecified trimester: Secondary | ICD-10-CM

## 2021-02-15 DIAGNOSIS — Z98891 History of uterine scar from previous surgery: Secondary | ICD-10-CM

## 2021-02-15 DIAGNOSIS — Z8632 Personal history of gestational diabetes: Secondary | ICD-10-CM

## 2021-02-15 LAB — OB RESULTS CONSOLE GBS: GBS: POSITIVE

## 2021-02-15 MED ORDER — ASPIRIN 81 MG PO CHEW: 81.0000 mg | CHEWABLE_TABLET | Freq: Every day | ORAL | 6 refills | Status: DC
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
## 2021-02-16 ENCOUNTER — Encounter: Payer: Self-pay | Admitting: *Deleted

## 2021-02-16 LAB — CERVICOVAGINAL ANCILLARY ONLY
Bacterial Vaginitis (gardnerella): NEGATIVE
Candida Glabrata: NEGATIVE
Candida Vaginitis: POSITIVE — AB
Chlamydia: NEGATIVE
Comment: NEGATIVE
Comment: NEGATIVE
Comment: NEGATIVE
Comment: NEGATIVE
Comment: NEGATIVE
Comment: NORMAL
Neisseria Gonorrhea: NEGATIVE
Trichomonas: POSITIVE — AB

## 2021-02-16 LAB — HEMOGLOBIN A1C
Est. average glucose Bld gHb Est-mCnc: 126 mg/dL
Hgb A1c MFr Bld: 6 % — ABNORMAL HIGH (ref 4.8–5.6)

## 2021-02-16 LAB — CBC/D/PLT+RPR+RH+ABO+RUBIGG...
Antibody Screen: NEGATIVE
Basophils Absolute: 0 10*3/uL (ref 0.0–0.2)
Basos: 0 %
EOS (ABSOLUTE): 0.2 10*3/uL (ref 0.0–0.4)
Eos: 3 %
HCV Ab: <0.1 s/co ratio (ref 0.0–0.9)
HIV Screen 4th Generation wRfx: NONREACTIVE
Hematocrit: 31.3 % — ABNORMAL LOW (ref 34.0–46.6)
Hemoglobin: 9.2 g/dL — ABNORMAL LOW (ref 11.1–15.9)
Hepatitis B Surface Ag: NEGATIVE
Immature Grans (Abs): 0 10*3/uL (ref 0.0–0.1)
Immature Granulocytes: 0 %
Lymphocytes Absolute: 1.8 10*3/uL (ref 0.7–3.1)
Lymphs: 30 %
MCH: 19.8 pg — ABNORMAL LOW (ref 26.6–33.0)
MCHC: 29.4 g/dL — ABNORMAL LOW (ref 31.5–35.7)
MCV: 67 fL — ABNORMAL LOW (ref 79–97)
Monocytes Absolute: 0.4 10*3/uL (ref 0.1–0.9)
Monocytes: 6 %
Neutrophils Absolute: 3.6 10*3/uL (ref 1.4–7.0)
Neutrophils: 61 %
Platelets: 346 10*3/uL (ref 150–450)
RBC: 4.65 x10E6/uL (ref 3.77–5.28)
RDW: 21.1 % — ABNORMAL HIGH (ref 11.7–15.4)
RPR Ser Ql: NONREACTIVE
Rh Factor: POSITIVE
Rubella Antibodies, IGG: 2.49 index (ref >0.99)
WBC: 6.1 10*3/uL (ref 3.4–10.8)

## 2021-02-16 LAB — HCV INTERPRETATION

## 2021-02-16 LAB — GLUCOSE TOLERANCE, 2 HOURS W/ 1HR
Glucose, 1 hour: 177 mg/dL (ref 70–179)
Glucose, 2 hour: 129 mg/dL (ref 70–152)
Glucose, Fasting: 89 mg/dL (ref 70–91)

## 2021-02-20 ENCOUNTER — Ambulatory Visit (INDEPENDENT_AMBULATORY_CARE_PROVIDER_SITE_OTHER): Payer: Medicaid Other | Admitting: Clinical

## 2021-02-20 ENCOUNTER — Encounter: Payer: Self-pay | Admitting: Hematology

## 2021-02-20 DIAGNOSIS — F4323 Adjustment disorder with mixed anxiety and depressed mood: Secondary | ICD-10-CM

## 2021-02-20 NOTE — Patient Instructions (Signed)
[facility] [address] [place], [place] [address] [phone] (main office) [phone] (Glory Graefe's office)  Coping with Panic Attacks   What is a panic attack?  You may have had a panic attack if you experienced four or more of the symptoms listed below coming on abruptly and peaking in about 10 minutes.  Panic Symptoms    Pounding heart   Sweating   Trembling or shaking   Shortness of breath   Feeling of choking   Chest pain   Nausea or abdominal distress     Feeling dizzy, unsteady, lightheaded, or faint   Feelings of unreality or being detached from yourself   Fear of losing control or going crazy   Fear of dying   Numbness or tingling   Chills or hot flashes      Panic attacks are sometimes accompanied by avoidance of certain places or situations. These are often situations that would be difficult to escape from or in which help might not be available. Examples might include crowded shopping malls, public transportation, restaurants, or driving.   Why do panic attacks occur?   Panic attacks are the body's alarm system gone awry. All of us have a built-in alarm system, powered by adrenaline, which increases our heart rate, breathing, and blood flow in response to danger. Ordinarily, this 'danger response system' works well. In some people, however, the response is either out of proportion to whatever stress is going on, or may come out of the blue without any stress at all.   For example, if you are walking in the woods and see a bear coming your way, a variety of changes occur in your body to prepare you to either fight the danger or flee from the situation. Your heart rate will increase to get more blood flow around your body, your breathing rate will quicken so that more oxygen is available, and your muscles will tighten in order to be ready to fight or run. You may feel nauseated as blood flow leaves your stomach area and  moves into your limbs. These bodily changes are all essential to helping you survive the dangerous situation. After the danger has passed, your body functions will begin to go back to normal. This is because your body also has a system for "recovering" by bringing your body back down to a normal state when the danger is over.   As you can see, the emergency response system is adaptive when there is, in fact, a "true" or "real" danger (e.g., bear). However, sometimes people find that their emergency response system is triggered in "everyday" situations where there really is no true physical danger (e.g., in a meeting, in the grocery store, while driving in normal traffic, etc.).   What triggers a panic attack?  Sometimes particularly stressful situations can trigger a panic attack. For example, an argument with your spouse or stressors at work can cause a stress response (activating the emergency response system) because you perceive it as threatening or overwhelming, even if there is no direct risk to your survival.  Sometimes panic attacks don't seem to be triggered by anything in particular- they may "come out of the blue". Somehow, the natural "fight or flight" emergency response system has gotten activated when there is no real danger. Why does the body go into "emergency mode" when there is no real danger?   Often, people with panic attacks are frightened or alarmed by the physical sensations of   the emergency response system. First, unexpected physical sensations are experienced (tightness in your chest or some shortness of breath). This then leads to feeling fearful or alarmed by these symptoms ("Something's wrong!", "Am I having a heart attack?", "Am I going to faint?") The mind perceives that there is a danger even though no real danger exists. This, in turn, activates the emergency response system ("fight or flight"), leading to a "full blown" panic attack. In summary, panic attacks occur when we  misinterpret physical symptoms as signs of impending death, craziness, loss of control, embarrassment, or fear of fear. Sometimes you may be aware of thoughts of danger that activate the emergency response system (for example, thinking "I'm having a heart attack" when you feel chest pressure or increased heart rate). At other times, however, you may not be aware of such thoughts. After several incidences of being afraid of physical sensations, anxiety and panic can occur in response to the initial sensations without conscious thoughts of danger. Instead, you just feel afraid or alarmed. In other words, the panic or fear may seem to occur "automatically" without you consciously telling yourself anything.   After having had one or more panic attacks, you may also become more focused on what is going on inside your body. You may scan your body and be more vigilant about noticing any symptoms that might signal the start of a panic attack. This makes it easier for panic attacks to happen again because you pick up on sensations you might otherwise not have noticed, and misinterpret them as something dangerous. A panic attack may then result.      How do I cope with panic attacks?  An important part of overcoming panic attacks involves re-interpreting your body's physical reactions and teaching yourself ways to decrease the physical arousal. This can be done through practicing the cognitive and behavioral interventions below.   Research has found that over half of people who have panic attacks show some signs of hyperventilation or overbreathing. This can produce initial sensations that alarm you and lead to a panic attack. Overbreathing can also develop as part of the panic attack and make the symptoms worse. When people hyperventilate, certain blood vessels in the body become narrower. In particular, the brain may get slightly less oxygen. This can lead to the symptoms of dizziness, confusion, and  lightheadedness that often occur during panic attacks. Other parts of the body may also get a bit less oxygen, which may lead to numbness or tingling in the hands or feet or the sensation of cold, clammy hands. It also may lead the heart to pump harder. Although these symptoms may be frightening and feel unpleasant, it is important to remember that hyperventilating is not dangerous. However, you can help overcome the unpleasantness of overbreathing by practicing Breathing Retraining.   Practice this basic technique three times a day, every day:   Inhale. With your shoulders relaxed, inhale as slowly and deeply as you can while you count to six. If you can, use your diaphragm to fill your lungs with air.   Hold. Keep the air in your lungs as you slowly count to four.   Exhale. Slowly breath out as you count to six.   Repeat. Do the inhale-hold-exhale cycle several times. Each time you do it, exhale for longer counts.  Like any new skill, Breathing Retraining requires practice. Try practicing this skill twice a day for several minutes. Initially, do not try this technique in specific situations or when you   become frightened or have a panic attack. Begin by practicing in a quiet environment to build up your skill level so that you can later use it in time of "emergency."   2. Decreasing Avoidance  Regardless of whether you can identify why you began having panic attacks or whether they seemed to come out of the blue, the places where you began having panic attacks often can become triggers themselves. It is not uncommon for individuals to begin to avoid the places where they have had panic attacks. Over time, the individual may begin to avoid more and more places, thereby decreasing their activities and often negatively impacting their quality of life. To break the cycle of avoidance, it is important to first identify the places or situations that are being avoided, and then to do some "relearning."  To  begin this intervention, first create a list of locations or situations that you tend to avoid. Then choose an avoided location or situation that you would like to target first. Now develop an "exposure hierarchy" for this situation or location. An "exposure hierarchy" is a list of actions that make you feel anxious in this situation. Order these actions from least to most anxiety-producing. It is often helpful to have the first item on your hierarchy involve thinking or imagining part of the feared/avoided situation.   Here is an example of an exposure hierarchy for decreasing avoidance of the grocery store. Note how it is ordered from the least amount of anxiety (at the top) to the most anxiety (at the bottom):   Think about going to the grocery store alone.   Go to the grocery store with a friend or family member.   Go to the grocery store alone to pick up a few small items (5-10 minutes in the store).   Shopping for 10-20 minutes in the store alone.   Doing the shopping for the week by myself (20-30 minutes in the store).   Your homework is to "expose" yourself to the lowest item on your hierarchy and use your breathing relaxation and coping statements (see below) to help you remain in the situation. Practice this several times during the upcoming week. Once you have mastered each item with minimal anxiety, move on to the next higher action on your list.   Cognitive Interventions  Identify your negative self-talk Anxious thoughts can increase anxiety symptoms and panic. The first step in changing anxious thinking is to identify your own negative, alarming self-talk. Some common alarming thoughts:  I'm having a heart attack.            I must be going crazy. I think I'm dying. People will think I'm crazy. I'm going to pass our.  Oh no- here it comes.  I can't stand this.  I've got to get out of here!  2. Use positive coping statements Changing or disrupting a pattern of anxious thoughts  by replacing them with more calming or supportive statements can help to divert a panic attack. Some common helpful coping statements:  This is not an emergency.  I don't like feeling this way, but I can accept it.  I can feel like this and still be okay.  This has happened before, and I was okay. I'll be okay this time, too.  I can be anxious and still deal with this situation.       

## 2021-02-21 ENCOUNTER — Other Ambulatory Visit: Payer: Self-pay | Admitting: *Deleted

## 2021-02-21 ENCOUNTER — Other Ambulatory Visit: Payer: Self-pay

## 2021-02-21 ENCOUNTER — Ambulatory Visit: Payer: Medicaid Other | Attending: Obstetrics and Gynecology

## 2021-02-21 ENCOUNTER — Encounter: Payer: Self-pay | Admitting: Obstetrics and Gynecology

## 2021-02-21 DIAGNOSIS — Z3491 Encounter for supervision of normal pregnancy, unspecified, first trimester: Secondary | ICD-10-CM | POA: Diagnosis present

## 2021-02-21 DIAGNOSIS — R8271 Bacteriuria: Secondary | ICD-10-CM | POA: Insufficient documentation

## 2021-02-21 DIAGNOSIS — O34219 Maternal care for unspecified type scar from previous cesarean delivery: Secondary | ICD-10-CM

## 2021-02-21 DIAGNOSIS — O99332 Smoking (tobacco) complicating pregnancy, second trimester: Secondary | ICD-10-CM

## 2021-02-21 DIAGNOSIS — Z8632 Personal history of gestational diabetes: Secondary | ICD-10-CM

## 2021-02-21 DIAGNOSIS — O0942 Supervision of pregnancy with grand multiparity, second trimester: Secondary | ICD-10-CM

## 2021-02-21 LAB — URINE CULTURE, OB REFLEX

## 2021-02-21 LAB — CULTURE, OB URINE

## 2021-02-22 ENCOUNTER — Telehealth: Payer: Self-pay

## 2021-02-22 DIAGNOSIS — O99012 Anemia complicating pregnancy, second trimester: Secondary | ICD-10-CM

## 2021-02-22 DIAGNOSIS — R8271 Bacteriuria: Secondary | ICD-10-CM

## 2021-02-22 DIAGNOSIS — A599 Trichomoniasis, unspecified: Secondary | ICD-10-CM

## 2021-02-22 DIAGNOSIS — B379 Candidiasis, unspecified: Secondary | ICD-10-CM

## 2021-02-22 MED ORDER — TERCONAZOLE 0.4 % VA CREA: 1.0000 | TOPICAL_CREAM | Freq: Every day | VAGINAL | 0 refills | Status: DC

## 2021-02-22 MED ORDER — METRONIDAZOLE 500 MG PO TABS: 500.0000 mg | ORAL_TABLET | Freq: Two times a day (BID) | ORAL | 0 refills | Status: DC

## 2021-02-22 NOTE — Telephone Encounter (Signed)
-----   Message from Warden Fillers, MD sent at 02/20/2021 11:07 AM EST ----- Vaginal swab showed yeast and trich, will offer treatment, mild anemia noted, A1c suggestive of  prediabetes

## 2021-02-22 NOTE — Telephone Encounter (Signed)
Meds ordered per protocol. Called patient and informed her of results, reviewed importance of partner treatment and abstinence x 10 days. Patient verbalized understanding and states she went ahead and started OTC monistat for yeast and it helped some but not completely. Discussed terazol Rx sent to pharmacy which should treat it. Patient verbalized understanding.

## 2021-02-26 MED ORDER — AMOXICILLIN 500 MG PO TABS: 500.0000 mg | ORAL_TABLET | Freq: Three times a day (TID) | ORAL | 0 refills | Status: DC

## 2021-02-26 MED ORDER — FERROUS SULFATE 325 (65 FE) MG PO TABS: 325.0000 mg | ORAL_TABLET | ORAL | 0 refills | Status: DC

## 2021-02-26 NOTE — Addendum Note (Signed)
Addended by: Marjo Bicker on: 02/26/2021 08:45 AM   Modules accepted: Orders

## 2021-02-26 NOTE — Telephone Encounter (Signed)
Result note from [facility]: "GBS in urine, offer amoxicillin 500 mg q 8 hours for 4 days for treatment"  Called patient to review results and provide instructions for treatment. Also reviewed hemoglobin of 9.2. Explained that Dr. Donavan Foil recommends oral iron supplement. Encouraged pt to take every other day with orange juice. Patient states she has been trying to eat more iron rich foods. Patient reports some constipation. Explained that iron supplement may worsen this. List of medications safe to take sent to patient for OTC treatment of constipation. Reviewed elevated A1C with patient. Normal 2 HR GTT at this time. Will retest in third trimester.

## 2021-02-28 NOTE — BH Specialist Note (Signed)
Integrated Behavioral Health via Telemedicine Visit  02/28/2021 Amanda Church [id number]  Number of Integrated Behavioral Health visits: 2 Session Start time: 2:17  Session End time: 2:50 Total time:  62  Referring Provider: Mariel Aloe, MD Patient/Family location: Home [facility] Provider location: [facility]  All persons participating in visit: Patient Amanda Church and [facility] Manroop Jakubowicz   Types of Service: Individual psychotherapy and Video visit  I connected with Amanda Church and/or Amanda Church's  n/a  via  Telephone or Video Enabled Telemedicine Application  (Video is Caregility application) and verified that I am speaking with the correct person using two identifiers. Discussed confidentiality: Yes   I discussed the limitations of telemedicine and the availability of in person appointments.  Discussed there is a possibility of technology failure and discussed alternative modes of communication if that failure occurs.  I discussed that engaging in this telemedicine visit, they consent to the provision of behavioral healthcare and the services will be billed under their insurance.  Patient and/or legal guardian expressed understanding and consented to Telemedicine visit: Yes   Presenting Concerns: Patient and/or family reports the following symptoms/concerns: Anxious about physical aches and pains in current pregnancy that differs from previous pregnancies; not feeling heard/feelings dismissed among family/friends not taking her concerns seriously. Pt continues to isolate and take deep breaths when overwhelmed/anxious; open to implementing additional self-coping strategy.  Duration of problem: Current pregnancy increase; Severity of problem: moderate  Patient and/or Family's Strengths/Protective Factors: Social connections, Concrete supports in place (healthy food, safe environments, etc.), and Sense of  purpose  Goals Addressed: Patient will:  Reduce symptoms of: anxiety, depression, and stress   Increase knowledge and/or ability of: coping skills   Demonstrate ability to: Increase healthy adjustment to current life circumstances  Progress towards Goals: Ongoing  Interventions: Interventions utilized:  Mindfulness or Relaxation Training Standardized Assessments completed:  Not given today  Patient and/or Family Response: Pt agrees with treatment plan  Assessment: Patient currently experiencing Adjustment disorder with mixed anxious and depressed mood.   Patient may benefit from continued psychoeducation and brief therapeutic interventions regarding coping with symptoms of anxiety, depression, life stress .  Plan: Follow up with behavioral health clinician on : Two weeks Behavioral recommendations:  -Continue taking prenatal vitamins as prescribed -Add CALM relaxation breathing exercise twice daily (morning; at bedtime with sleep sounds); as needed throughout the day  Referral(s): Integrated [employer] (In Clinic)  I discussed the assessment and treatment plan with the patient and/or parent/guardian. They were provided an opportunity to ask questions and all were answered. They agreed with the plan and demonstrated an understanding of the instructions.   They were advised to call back or seek an in-person evaluation if the symptoms worsen or if the condition fails to improve as anticipated.  Rae Lips, LCSW  Depression screen [facility] 2/9 02/15/2021 01/31/2021  Decreased Interest 1 0  Down, Depressed, Hopeless 0 0  PHQ - 2 Score 1 0  Altered sleeping 1 1  Tired, decreased energy 2 1  Change in appetite 0 1  Feeling bad or failure about yourself  0 0  Trouble concentrating 0 0  Moving slowly or fidgety/restless 0 0  Suicidal thoughts 0 0  PHQ-9 Score 4 3   GAD 7 : Generalized Anxiety Score 02/15/2021 01/31/2021  Nervous, Anxious, on Edge 1 0   Control/stop worrying 0 0  Worry too much - different things 1 3  Trouble relaxing 0 0  Restless 1 0  Easily annoyed or irritable 1 3  Afraid - awful might happen 0 0  Total GAD 7 Score 4 6

## 2021-03-01 ENCOUNTER — Telehealth: Payer: Self-pay | Admitting: Lactation Services

## 2021-03-01 ENCOUNTER — Encounter: Payer: Self-pay | Admitting: Obstetrics and Gynecology

## 2021-03-01 DIAGNOSIS — D563 Thalassemia minor: Secondary | ICD-10-CM | POA: Insufficient documentation

## 2021-03-01 NOTE — Telephone Encounter (Signed)
Called patient to give results of [facility] showing that she is a silent Carrier for Alpha Thalassemia.   Patient did not answer. LM for her to call the office at [phone] for non urgent results and to also check her My Chart message.

## 2021-03-07 ENCOUNTER — Ambulatory Visit: Payer: Medicaid Other

## 2021-03-07 ENCOUNTER — Other Ambulatory Visit: Payer: Self-pay

## 2021-03-08 ENCOUNTER — Ambulatory Visit (HOSPITAL_BASED_OUTPATIENT_CLINIC_OR_DEPARTMENT_OTHER): Payer: Medicaid Other

## 2021-03-08 ENCOUNTER — Ambulatory Visit: Payer: Medicaid Other | Attending: Obstetrics | Admitting: *Deleted

## 2021-03-08 ENCOUNTER — Other Ambulatory Visit: Payer: Self-pay

## 2021-03-08 VITALS — BP 119/54 | HR 95

## 2021-03-08 DIAGNOSIS — O0942 Supervision of pregnancy with grand multiparity, second trimester: Secondary | ICD-10-CM | POA: Diagnosis not present

## 2021-03-08 DIAGNOSIS — F17210 Nicotine dependence, cigarettes, uncomplicated: Secondary | ICD-10-CM | POA: Insufficient documentation

## 2021-03-08 DIAGNOSIS — Z8632 Personal history of gestational diabetes: Secondary | ICD-10-CM

## 2021-03-08 DIAGNOSIS — O34219 Maternal care for unspecified type scar from previous cesarean delivery: Secondary | ICD-10-CM | POA: Diagnosis not present

## 2021-03-08 DIAGNOSIS — Z3A17 17 weeks gestation of pregnancy: Secondary | ICD-10-CM | POA: Diagnosis not present

## 2021-03-08 DIAGNOSIS — O09292 Supervision of pregnancy with other poor reproductive or obstetric history, second trimester: Secondary | ICD-10-CM

## 2021-03-08 DIAGNOSIS — O09522 Supervision of elderly multigravida, second trimester: Secondary | ICD-10-CM | POA: Insufficient documentation

## 2021-03-08 DIAGNOSIS — O99332 Smoking (tobacco) complicating pregnancy, second trimester: Secondary | ICD-10-CM | POA: Insufficient documentation

## 2021-03-08 DIAGNOSIS — Z3491 Encounter for supervision of normal pregnancy, unspecified, first trimester: Secondary | ICD-10-CM

## 2021-03-08 NOTE — Progress Notes (Signed)
Pt states she feels pain when she sneezes and stretches.

## 2021-03-11 NOTE — L&D Delivery Note (Signed)
OB/[facility]N [facility] Delivery Note  Amanda Church is a 36 y.o. [id number] s/p SVD at [redacted]w[redacted]d. She was admitted for early labor with decreased fetal movement.   ROM: 19h 52m with clear fluid GBS Status: negative Maximum Maternal Temperature: 98.3  Labor Progress: Pt arrived in early labor and was augmented with a FB, intermittent pitocin and AROM.  Delivery Date/Time: 08/17/21 at 1408 Delivery: Called to room and patient was complete and pushing. Head delivered LOA. Cord present around neck, arm and body. Shoulder and body delivered using somersault maneuver to reduce cords. Infant with no initial spontaneous cry, so cord cut and clamped by CNM so baby could go to warmer for assessment (quickly returned to mom within ). Cord blood drawn and cord section collected. Placenta delivered spontaneously, intact, with 3-vessel cord. Fundus firm with massage and Pitocin. Labia, perineum, vagina, and cervix inspected, no laceration found.  Placenta: spontaneous, to L&D for disposal Complications: None Lacerations: None EBL: 250 Analgesia: epidural  Postpartum Planning [x]  transfer orders to MB [x]  discharge summary started & shared [x]  message to sent to schedule follow-up  [x]  lists updated [x]  vaccines UTD  Infant: Boy(yes)  APGARs 7/9  3460g  , CNM, IBCLC Certified Nurse Midwife, for , [facility]

## 2021-03-14 ENCOUNTER — Encounter: Payer: Self-pay | Admitting: Hematology

## 2021-03-14 ENCOUNTER — Ambulatory Visit (INDEPENDENT_AMBULATORY_CARE_PROVIDER_SITE_OTHER): Payer: Medicaid Other | Admitting: Clinical

## 2021-03-14 DIAGNOSIS — F4323 Adjustment disorder with mixed anxiety and depressed mood: Secondary | ICD-10-CM

## 2021-03-14 NOTE — Patient Instructions (Signed)
[facility] [address] [place], [place] [address] [phone] (main office) [phone] (Ranny Wiebelhaus's office)   

## 2021-03-15 NOTE — BH Specialist Note (Signed)
Pt did not arrive to video visit and did not answer the phone ; Unable to reach by phone/unable to leave voice message "cannot be reached at this time"; left MyChart message for patient.

## 2021-03-16 ENCOUNTER — Encounter: Payer: Self-pay | Admitting: Obstetrics and Gynecology

## 2021-03-16 ENCOUNTER — Other Ambulatory Visit (HOSPITAL_COMMUNITY)
Admission: RE | Admit: 2021-03-16 | Discharge: 2021-03-16 | Disposition: A | Discharge status: Home / Self Care | Payer: Medicaid Other | Source: Ambulatory Visit | Attending: Obstetrics and Gynecology | Admitting: Obstetrics and Gynecology

## 2021-03-16 ENCOUNTER — Other Ambulatory Visit: Payer: Self-pay

## 2021-03-16 ENCOUNTER — Ambulatory Visit (INDEPENDENT_AMBULATORY_CARE_PROVIDER_SITE_OTHER): Payer: Medicaid Other | Admitting: Obstetrics and Gynecology

## 2021-03-16 VITALS — BP 130/66 | HR 88 | Wt 250.5 lb

## 2021-03-16 DIAGNOSIS — F419 Anxiety disorder, unspecified: Secondary | ICD-10-CM

## 2021-03-16 DIAGNOSIS — R8271 Bacteriuria: Secondary | ICD-10-CM

## 2021-03-16 DIAGNOSIS — Z98891 History of uterine scar from previous surgery: Secondary | ICD-10-CM

## 2021-03-16 DIAGNOSIS — Z23 Encounter for immunization: Secondary | ICD-10-CM | POA: Diagnosis not present

## 2021-03-16 DIAGNOSIS — R87613 High grade squamous intraepithelial lesion on cytologic smear of cervix (HGSIL): Secondary | ICD-10-CM

## 2021-03-16 DIAGNOSIS — Z3491 Encounter for supervision of normal pregnancy, unspecified, first trimester: Secondary | ICD-10-CM | POA: Insufficient documentation

## 2021-03-16 DIAGNOSIS — Z3009 Encounter for other general counseling and advice on contraception: Secondary | ICD-10-CM

## 2021-03-16 DIAGNOSIS — Z9889 Other specified postprocedural states: Secondary | ICD-10-CM

## 2021-03-16 NOTE — Progress Notes (Signed)
Subjective:  PETRONILLA GARCIAGONZALEZ is a 36 y.o. G7P6006 at [redacted]w[redacted]d being seen today for ongoing prenatal care.  She is currently monitored for the following issues for this high-risk pregnancy and has Chronic bilateral low back pain with sciatica; Incontinence of feces; Chronic migraine w/o aura w/o status migrainosus, not intractable; Anemia; Iron deficiency anemia; Previous cesarean delivery affecting pregnancy; HGSIL (high grade squamous intraepithelial lesion) on Pap smear of cervix; History of gestational diabetes in prior pregnancy, currently pregnant; Supervision of low-risk pregnancy, first trimester; Anxiety; Supervision of high risk pregnancy, antepartum; History of 2 cesarean sections; Hx of cone biopsy of cervix; Group B streptococcal bacteriuria; and Alpha thalassemia silent carrier on their problem list.  Patient reports no complaints.  Contractions: Not present. Vag. Bleeding: None.  Movement: Present. Denies leaking of fluid.   The following portions of the patient's history were reviewed and updated as appropriate: allergies, current medications, past family history, past medical history, past social history, past surgical history and problem list. Problem list updated.  Objective:   Vitals:   03/16/21 0838  BP: 130/66  Pulse: 88  Weight: 250 lb 8 oz (113.6 kg)    Fetal Status: Fetal Heart Rate (bpm): 156   Movement: Present     General:  Alert, oriented and cooperative. Patient is in no acute distress.  Skin: Skin is warm and dry. No rash noted.   Cardiovascular: Normal heart rate noted  Respiratory: Normal respiratory effort, no problems with respiration noted  Abdomen: Soft, gravid, appropriate for gestational age. Pain/Pressure: Present     Pelvic:  Cervical exam deferred        Extremities: Normal range of motion.  Edema: None  Mental Status: Normal mood and affect. Normal behavior. Normal judgment and thought content.   Urinalysis:      Assessment and Plan:  Pregnancy:  G7P6006 at [redacted]w[redacted]d  1. Supervision of low-risk pregnancy, first trimester Stable Anatomy scan scheduled - Cervicovaginal ancillary only( [address]) - AMBULATORY REFERRAL TO [facility] - Flu Vaccine QUAD 56mo+IM (Fluarix, Fluzone & Alfiuria Quad PF)  2. Group B streptococcal bacteriuria Tx while in labor  3. History of 2 cesarean sections Desires VBAC  4. HGSIL (high grade squamous intraepithelial lesion) on Pap smear of cervix Will need pap smear PP  5. Anxiety Stable Sees Jamie  6. Hx of cone biopsy of cervix CL normal 03/08/21 Repeat at anatomy scan  7. Unwanted fertility BTL papers at later visit  Preterm labor symptoms and general obstetric precautions including but not limited to vaginal bleeding, contractions, leaking of fluid and fetal movement were reviewed in detail with the patient. Please refer to After Visit Summary for other counseling recommendations.  Return in about 4 weeks (around 04/13/2021) for OB visit, face to face, MD only.   Chancy Milroy, MD

## 2021-03-16 NOTE — Patient Instructions (Signed)

## 2021-03-17 LAB — CERVICOVAGINAL ANCILLARY ONLY
Bacterial Vaginitis (gardnerella): NEGATIVE
Candida Glabrata: NEGATIVE
Candida Vaginitis: POSITIVE — AB
Chlamydia: NEGATIVE
Comment: NEGATIVE
Comment: NEGATIVE
Comment: NEGATIVE
Comment: NEGATIVE
Comment: NEGATIVE
Comment: NORMAL
Neisseria Gonorrhea: NEGATIVE
Trichomonas: NEGATIVE

## 2021-03-19 ENCOUNTER — Telehealth: Payer: Self-pay

## 2021-03-19 NOTE — Telephone Encounter (Addendum)
-----   Message from Hermina Staggers, MD sent at 03/19/2021  1:45 PM EST ----- Please send in Rx for yeast treatment as per protocol. Pt is aware.  Thanks Michael   Pt notified of yeast infection.  Pt stated that she was aware that she had a yeast infection as she was ending an oral antibiotic therapy.  Pt stated that she is already taking treatment for the yeast infection.  I advised pt that if she needs anything to please reach out.  Pt verbalized understanding with no further questions.   Amanda Church  03/19/21

## 2021-03-21 ENCOUNTER — Ambulatory Visit: Payer: Medicaid Other | Admitting: *Deleted

## 2021-03-21 ENCOUNTER — Other Ambulatory Visit: Payer: Self-pay | Admitting: Obstetrics

## 2021-03-21 ENCOUNTER — Ambulatory Visit: Payer: Medicaid Other | Attending: Obstetrics

## 2021-03-21 ENCOUNTER — Other Ambulatory Visit: Payer: Self-pay

## 2021-03-21 VITALS — BP 115/47 | HR 86

## 2021-03-21 DIAGNOSIS — O99332 Smoking (tobacco) complicating pregnancy, second trimester: Secondary | ICD-10-CM | POA: Diagnosis present

## 2021-03-21 DIAGNOSIS — Z3686 Encounter for antenatal screening for cervical length: Secondary | ICD-10-CM | POA: Diagnosis not present

## 2021-03-21 DIAGNOSIS — O0942 Supervision of pregnancy with grand multiparity, second trimester: Secondary | ICD-10-CM

## 2021-03-21 DIAGNOSIS — Z148 Genetic carrier of other disease: Secondary | ICD-10-CM | POA: Diagnosis not present

## 2021-03-21 DIAGNOSIS — Z3491 Encounter for supervision of normal pregnancy, unspecified, first trimester: Secondary | ICD-10-CM | POA: Diagnosis present

## 2021-03-21 DIAGNOSIS — Z8632 Personal history of gestational diabetes: Secondary | ICD-10-CM | POA: Diagnosis present

## 2021-03-21 DIAGNOSIS — Z3A19 19 weeks gestation of pregnancy: Secondary | ICD-10-CM | POA: Diagnosis not present

## 2021-03-21 DIAGNOSIS — O09292 Supervision of pregnancy with other poor reproductive or obstetric history, second trimester: Secondary | ICD-10-CM | POA: Diagnosis present

## 2021-03-21 DIAGNOSIS — O99212 Obesity complicating pregnancy, second trimester: Secondary | ICD-10-CM | POA: Insufficient documentation

## 2021-03-21 DIAGNOSIS — O09522 Supervision of elderly multigravida, second trimester: Secondary | ICD-10-CM | POA: Insufficient documentation

## 2021-03-21 DIAGNOSIS — O34219 Maternal care for unspecified type scar from previous cesarean delivery: Secondary | ICD-10-CM | POA: Insufficient documentation

## 2021-03-22 ENCOUNTER — Other Ambulatory Visit: Payer: Self-pay | Admitting: *Deleted

## 2021-03-22 DIAGNOSIS — Z3686 Encounter for antenatal screening for cervical length: Secondary | ICD-10-CM

## 2021-03-22 DIAGNOSIS — O09299 Supervision of pregnancy with other poor reproductive or obstetric history, unspecified trimester: Secondary | ICD-10-CM

## 2021-03-22 DIAGNOSIS — Z8632 Personal history of gestational diabetes: Secondary | ICD-10-CM

## 2021-03-22 DIAGNOSIS — Z362 Encounter for other antenatal screening follow-up: Secondary | ICD-10-CM

## 2021-03-22 DIAGNOSIS — O09522 Supervision of elderly multigravida, second trimester: Secondary | ICD-10-CM

## 2021-03-22 DIAGNOSIS — R638 Other symptoms and signs concerning food and fluid intake: Secondary | ICD-10-CM

## 2021-03-28 ENCOUNTER — Ambulatory Visit: Payer: Medicaid Other | Admitting: Clinical

## 2021-03-28 ENCOUNTER — Encounter: Payer: Self-pay | Admitting: Hematology

## 2021-03-28 DIAGNOSIS — Z91199 Patient's noncompliance with other medical treatment and regimen due to unspecified reason: Secondary | ICD-10-CM

## 2021-04-03 ENCOUNTER — Ambulatory Visit: Payer: Medicaid Other

## 2021-04-09 ENCOUNTER — Ambulatory Visit: Payer: Medicaid Other

## 2021-04-09 ENCOUNTER — Other Ambulatory Visit: Payer: Medicaid Other

## 2021-04-13 ENCOUNTER — Encounter: Payer: Medicaid Other | Admitting: Obstetrics and Gynecology

## 2021-04-17 ENCOUNTER — Ambulatory Visit: Payer: Medicaid Other

## 2021-04-18 ENCOUNTER — Ambulatory Visit: Payer: Medicaid Other | Attending: Obstetrics and Gynecology | Admitting: *Deleted

## 2021-04-18 ENCOUNTER — Ambulatory Visit (HOSPITAL_BASED_OUTPATIENT_CLINIC_OR_DEPARTMENT_OTHER): Payer: Medicaid Other

## 2021-04-18 ENCOUNTER — Other Ambulatory Visit: Payer: Self-pay

## 2021-04-18 VITALS — BP 118/48 | HR 98

## 2021-04-18 DIAGNOSIS — Z362 Encounter for other antenatal screening follow-up: Secondary | ICD-10-CM

## 2021-04-18 DIAGNOSIS — D563 Thalassemia minor: Secondary | ICD-10-CM | POA: Insufficient documentation

## 2021-04-18 DIAGNOSIS — O09522 Supervision of elderly multigravida, second trimester: Secondary | ICD-10-CM

## 2021-04-18 DIAGNOSIS — Z3686 Encounter for antenatal screening for cervical length: Secondary | ICD-10-CM

## 2021-04-18 DIAGNOSIS — O0942 Supervision of pregnancy with grand multiparity, second trimester: Secondary | ICD-10-CM | POA: Diagnosis not present

## 2021-04-18 DIAGNOSIS — O3442 Maternal care for other abnormalities of cervix, second trimester: Secondary | ICD-10-CM | POA: Insufficient documentation

## 2021-04-18 DIAGNOSIS — Z3491 Encounter for supervision of normal pregnancy, unspecified, first trimester: Secondary | ICD-10-CM

## 2021-04-18 DIAGNOSIS — O99212 Obesity complicating pregnancy, second trimester: Secondary | ICD-10-CM | POA: Insufficient documentation

## 2021-04-18 DIAGNOSIS — Z148 Genetic carrier of other disease: Secondary | ICD-10-CM | POA: Insufficient documentation

## 2021-04-18 DIAGNOSIS — Z3A23 23 weeks gestation of pregnancy: Secondary | ICD-10-CM | POA: Diagnosis not present

## 2021-04-18 DIAGNOSIS — O99332 Smoking (tobacco) complicating pregnancy, second trimester: Secondary | ICD-10-CM | POA: Diagnosis not present

## 2021-04-18 DIAGNOSIS — Z8632 Personal history of gestational diabetes: Secondary | ICD-10-CM | POA: Diagnosis not present

## 2021-04-18 DIAGNOSIS — O34219 Maternal care for unspecified type scar from previous cesarean delivery: Secondary | ICD-10-CM | POA: Insufficient documentation

## 2021-04-18 DIAGNOSIS — O09292 Supervision of pregnancy with other poor reproductive or obstetric history, second trimester: Secondary | ICD-10-CM | POA: Diagnosis not present

## 2021-04-18 DIAGNOSIS — R638 Other symptoms and signs concerning food and fluid intake: Secondary | ICD-10-CM

## 2021-04-19 ENCOUNTER — Other Ambulatory Visit: Payer: Self-pay | Admitting: *Deleted

## 2021-04-19 DIAGNOSIS — O0942 Supervision of pregnancy with grand multiparity, second trimester: Secondary | ICD-10-CM

## 2021-04-19 DIAGNOSIS — Z6841 Body Mass Index (BMI) 40.0 and over, adult: Secondary | ICD-10-CM

## 2021-04-26 ENCOUNTER — Other Ambulatory Visit: Payer: Self-pay

## 2021-04-26 ENCOUNTER — Encounter (HOSPITAL_COMMUNITY): Payer: Self-pay | Admitting: Obstetrics & Gynecology

## 2021-04-26 ENCOUNTER — Inpatient Hospital Stay (HOSPITAL_COMMUNITY)
Admission: AD | Admit: 2021-04-26 | Discharge: 2021-04-26 | Disposition: A | Discharge status: Home / Self Care | Payer: Medicaid Other | Attending: Obstetrics & Gynecology | Admitting: Obstetrics & Gynecology

## 2021-04-26 DIAGNOSIS — B379 Candidiasis, unspecified: Secondary | ICD-10-CM

## 2021-04-26 DIAGNOSIS — Z3A24 24 weeks gestation of pregnancy: Secondary | ICD-10-CM | POA: Insufficient documentation

## 2021-04-26 DIAGNOSIS — O99332 Smoking (tobacco) complicating pregnancy, second trimester: Secondary | ICD-10-CM | POA: Diagnosis not present

## 2021-04-26 DIAGNOSIS — O98812 Other maternal infectious and parasitic diseases complicating pregnancy, second trimester: Secondary | ICD-10-CM | POA: Insufficient documentation

## 2021-04-26 DIAGNOSIS — Z3689 Encounter for other specified antenatal screening: Secondary | ICD-10-CM

## 2021-04-26 DIAGNOSIS — Z7982 Long term (current) use of aspirin: Secondary | ICD-10-CM | POA: Diagnosis not present

## 2021-04-26 DIAGNOSIS — F17210 Nicotine dependence, cigarettes, uncomplicated: Secondary | ICD-10-CM | POA: Diagnosis not present

## 2021-04-26 DIAGNOSIS — Z3491 Encounter for supervision of normal pregnancy, unspecified, first trimester: Secondary | ICD-10-CM

## 2021-04-26 DIAGNOSIS — N898 Other specified noninflammatory disorders of vagina: Secondary | ICD-10-CM | POA: Insufficient documentation

## 2021-04-26 DIAGNOSIS — B3731 Acute candidiasis of vulva and vagina: Secondary | ICD-10-CM | POA: Diagnosis not present

## 2021-04-26 LAB — URINALYSIS, ROUTINE W REFLEX MICROSCOPIC
Bilirubin Urine: NEGATIVE
Glucose, UA: NEGATIVE mg/dL
Hgb urine dipstick: NEGATIVE
Ketones, ur: 5 mg/dL — AB
Leukocytes,Ua: NEGATIVE
Nitrite: NEGATIVE
Protein, ur: NEGATIVE mg/dL
Specific Gravity, Urine: 1.016 (ref 1.005–1.030)
pH: 5 (ref 5.0–8.0)

## 2021-04-26 LAB — WET PREP, GENITAL
Clue Cells Wet Prep HPF POC: NONE SEEN
Sperm: NONE SEEN
Trich, Wet Prep: NONE SEEN
WBC, Wet Prep HPF POC: <10 (ref <10)
Yeast Wet Prep HPF POC: NONE SEEN

## 2021-04-26 LAB — AMNISURE RUPTURE OF MEMBRANE (ROM) NOT AT ARMC: Amnisure ROM: NEGATIVE

## 2021-04-26 LAB — POCT FERN TEST: POCT Fern Test: NEGATIVE

## 2021-04-26 MED ORDER — TERCONAZOLE 0.4 % VA CREA
1.0000 | TOPICAL_CREAM | Freq: Every day | VAGINAL | 1 refills | Status: DC
Start: 2021-04-26 — End: 2021-07-12

## 2021-04-26 NOTE — MAU Note (Signed)
Presents stating she noticed LOF approximately 30 minutes ago.  Reports LOF with movement and coughing.  Also states car seat was wet upon getting up.  Reports fluid is clear.  Denies VB.  Endorses +FM.
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
## 2021-04-27 LAB — GC/CHLAMYDIA PROBE AMP (~~LOC~~) NOT AT ARMC
Chlamydia: NEGATIVE
Comment: NEGATIVE
Comment: NORMAL
Neisseria Gonorrhea: NEGATIVE

## 2021-04-30 ENCOUNTER — Encounter: Payer: Self-pay | Admitting: Obstetrics and Gynecology

## 2021-04-30 ENCOUNTER — Telehealth (INDEPENDENT_AMBULATORY_CARE_PROVIDER_SITE_OTHER): Payer: Medicaid Other | Admitting: Obstetrics and Gynecology

## 2021-04-30 VITALS — BP 133/70 | HR 98

## 2021-04-30 DIAGNOSIS — O0990 Supervision of high risk pregnancy, unspecified, unspecified trimester: Secondary | ICD-10-CM

## 2021-04-30 DIAGNOSIS — O0992 Supervision of high risk pregnancy, unspecified, second trimester: Secondary | ICD-10-CM

## 2021-04-30 DIAGNOSIS — O09292 Supervision of pregnancy with other poor reproductive or obstetric history, second trimester: Secondary | ICD-10-CM | POA: Diagnosis not present

## 2021-04-30 DIAGNOSIS — O34219 Maternal care for unspecified type scar from previous cesarean delivery: Secondary | ICD-10-CM | POA: Diagnosis not present

## 2021-04-30 DIAGNOSIS — O09299 Supervision of pregnancy with other poor reproductive or obstetric history, unspecified trimester: Secondary | ICD-10-CM

## 2021-04-30 DIAGNOSIS — Z3A24 24 weeks gestation of pregnancy: Secondary | ICD-10-CM

## 2021-04-30 DIAGNOSIS — R87613 High grade squamous intraepithelial lesion on cytologic smear of cervix (HGSIL): Secondary | ICD-10-CM

## 2021-04-30 DIAGNOSIS — O3442 Maternal care for other abnormalities of cervix, second trimester: Secondary | ICD-10-CM

## 2021-04-30 DIAGNOSIS — O99820 Streptococcus B carrier state complicating pregnancy: Secondary | ICD-10-CM

## 2021-04-30 DIAGNOSIS — R8271 Bacteriuria: Secondary | ICD-10-CM

## 2021-04-30 DIAGNOSIS — Z3491 Encounter for supervision of normal pregnancy, unspecified, first trimester: Secondary | ICD-10-CM

## 2021-04-30 DIAGNOSIS — Z9889 Other specified postprocedural states: Secondary | ICD-10-CM

## 2021-04-30 DIAGNOSIS — Z8632 Personal history of gestational diabetes: Secondary | ICD-10-CM

## 2021-04-30 NOTE — Patient Instructions (Signed)

## 2021-04-30 NOTE — Progress Notes (Signed)
TELEHEALTH OBSTETRICS VISIT ENCOUNTER NOTE  Provider location: [facility] for [facility]   Patient location: Home  I connected with ITXEL WICKARD on 04/30/21 at  4:15 PM EST by telephone at home and verified that I am speaking with the correct person using two identifiers. Of note, unable to do video encounter due to technical difficulties.    I discussed the limitations, risks, security and privacy concerns of performing an evaluation and management service by telephone and the availability of in person appointments. I also discussed with the patient that there may be a patient responsible charge related to this service. The patient expressed understanding and agreed to proceed.  Subjective:  Amanda Church is a 36 y.o. G7P6006 at [redacted]w[redacted]d being followed for ongoing prenatal care.  She is currently monitored for the following issues for this low-risk pregnancy and has Chronic bilateral low back pain with sciatica; Incontinence of feces; Chronic migraine w/o aura w/o status migrainosus, not intractable; Anemia; Iron deficiency anemia; Previous cesarean delivery affecting pregnancy; HGSIL (high grade squamous intraepithelial lesion) on Pap smear of cervix; History of gestational diabetes in prior pregnancy, currently pregnant; Supervision of low-risk pregnancy, first trimester; Anxiety; Supervision of high risk pregnancy, antepartum; History of 2 cesarean sections; Hx of cone biopsy of cervix; Group B streptococcal bacteriuria; Alpha thalassemia silent carrier; and Unwanted fertility on their problem list.  Patient reports backache. Reports fetal movement. Denies any contractions, bleeding or leaking of fluid.   The following portions of the patient's history were reviewed and updated as appropriate: allergies, current medications, past family history, past medical history, past social history, past surgical history and problem list.   Objective:  Blood pressure  133/70, pulse 98, last menstrual period 11/08/2020. General:  Alert, oriented and cooperative.   Mental Status: Normal mood and affect perceived. Normal judgment and thought content.  Rest of physical exam deferred due to type of encounter  Assessment and Plan:  Pregnancy: G7P6006 at [redacted]w[redacted]d 1. Supervision of high risk pregnancy, antepartum Stable Information for back pain sent to pt via  My chart  2. History of gestational diabetes in prior pregnancy, currently pregnant Glucola next visit  3. Previous cesarean delivery affecting pregnancy Desires VBAC. Will ned to discuss at later visit H/O c section x 2  4. HGSIL (high grade squamous intraepithelial lesion) on Pap smear of cervix Stable  5. Hx of cone biopsy of cervix CL stable on last U/S  6. Group B streptococcal bacteriuria Tx while in labor   Preterm labor symptoms and general obstetric precautions including but not limited to vaginal bleeding, contractions, leaking of fluid and fetal movement were reviewed in detail with the patient.  I discussed the assessment and treatment plan with the patient. The patient was provided an opportunity to ask questions and all were answered. The patient agreed with the plan and demonstrated an understanding of the instructions. The patient was advised to call back or seek an in-person office evaluation/go to MAU at [facility] for any urgent or concerning symptoms. Please refer to After Visit Summary for other counseling recommendations.   I provided 8 minutes of non-face-to-face time during this encounter.  Return in about 3 weeks (around 05/21/2021) for OB visit, face to face, MD only, fasting for Glucola.  Future Appointments  Date Time Provider Department Center  05/23/2021 10:30 AM [facility] NURSE [facility]  05/23/2021 10:45 AM [facility] US5 [facility]    Hermina Staggers, MD Center  for [employer], [facility]

## 2021-05-03 ENCOUNTER — Encounter: Payer: Self-pay | Admitting: Hematology

## 2021-05-14 ENCOUNTER — Ambulatory Visit (HOSPITAL_COMMUNITY)
Admission: EM | Admit: 2021-05-14 | Discharge: 2021-05-14 | Disposition: A | Discharge status: Home / Self Care | Payer: Medicaid Other | Attending: Internal Medicine | Admitting: Internal Medicine

## 2021-05-14 ENCOUNTER — Other Ambulatory Visit: Payer: Self-pay

## 2021-05-14 ENCOUNTER — Encounter (HOSPITAL_COMMUNITY): Payer: Self-pay | Admitting: *Deleted

## 2021-05-14 DIAGNOSIS — R0981 Nasal congestion: Secondary | ICD-10-CM | POA: Diagnosis not present

## 2021-05-14 DIAGNOSIS — Z3A00 Weeks of gestation of pregnancy not specified: Secondary | ICD-10-CM | POA: Insufficient documentation

## 2021-05-14 DIAGNOSIS — J028 Acute pharyngitis due to other specified organisms: Secondary | ICD-10-CM | POA: Insufficient documentation

## 2021-05-14 DIAGNOSIS — B9789 Other viral agents as the cause of diseases classified elsewhere: Secondary | ICD-10-CM | POA: Diagnosis not present

## 2021-05-14 DIAGNOSIS — J069 Acute upper respiratory infection, unspecified: Secondary | ICD-10-CM | POA: Insufficient documentation

## 2021-05-14 DIAGNOSIS — O26899 Other specified pregnancy related conditions, unspecified trimester: Secondary | ICD-10-CM | POA: Insufficient documentation

## 2021-05-14 DIAGNOSIS — O98519 Other viral diseases complicating pregnancy, unspecified trimester: Secondary | ICD-10-CM | POA: Insufficient documentation

## 2021-05-14 DIAGNOSIS — J029 Acute pharyngitis, unspecified: Secondary | ICD-10-CM | POA: Diagnosis not present

## 2021-05-14 DIAGNOSIS — Z20822 Contact with and (suspected) exposure to covid-19: Secondary | ICD-10-CM | POA: Insufficient documentation

## 2021-05-14 DIAGNOSIS — H9203 Otalgia, bilateral: Secondary | ICD-10-CM | POA: Diagnosis not present

## 2021-05-14 LAB — SARS CORONAVIRUS 2 (TAT 6-24 HRS): SARS Coronavirus 2: NEGATIVE

## 2021-05-14 LAB — POCT RAPID STREP A, ED / UC: Streptococcus, Group A Screen (Direct): NEGATIVE

## 2021-05-14 NOTE — Discharge Instructions (Signed)
Your strep test is negative. ?You have a cold, but could be Covid, so until the test comes back stay quarantined.  ?Call your OB doctor to check with them what they feels is safe to take for your nose congestion ?You may do saline nose rinses to clean out the mucous and will help with the post nasal drainage ?You may take one tablespoon of honey with a few drops of lemon as needed for cough.  ?
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
## 2021-05-14 NOTE — ED Triage Notes (Signed)
Pt reports sore throat and bilateral ear pain PLAN: also has nasal congestion. ?

## 2021-05-17 LAB — CULTURE, GROUP A STREP (THRC)

## 2021-05-23 ENCOUNTER — Ambulatory Visit: Payer: Medicaid Other

## 2021-05-23 ENCOUNTER — Ambulatory Visit: Payer: Medicaid Other | Attending: Obstetrics

## 2021-06-25 ENCOUNTER — Encounter: Payer: Self-pay | Admitting: *Deleted

## 2021-06-27 ENCOUNTER — Ambulatory Visit: Payer: Medicaid Other

## 2021-07-04 ENCOUNTER — Other Ambulatory Visit: Payer: Self-pay

## 2021-07-04 ENCOUNTER — Ambulatory Visit (INDEPENDENT_AMBULATORY_CARE_PROVIDER_SITE_OTHER): Payer: Medicaid Other | Admitting: Obstetrics and Gynecology

## 2021-07-04 VITALS — BP 129/68 | HR 108 | Wt 259.0 lb

## 2021-07-04 DIAGNOSIS — Z8759 Personal history of other complications of pregnancy, childbirth and the puerperium: Secondary | ICD-10-CM

## 2021-07-04 DIAGNOSIS — O0990 Supervision of high risk pregnancy, unspecified, unspecified trimester: Secondary | ICD-10-CM

## 2021-07-04 DIAGNOSIS — Z3A34 34 weeks gestation of pregnancy: Secondary | ICD-10-CM

## 2021-07-04 DIAGNOSIS — Z9889 Other specified postprocedural states: Secondary | ICD-10-CM

## 2021-07-04 DIAGNOSIS — Z23 Encounter for immunization: Secondary | ICD-10-CM | POA: Diagnosis not present

## 2021-07-04 DIAGNOSIS — Z98891 History of uterine scar from previous surgery: Secondary | ICD-10-CM

## 2021-07-04 NOTE — Progress Notes (Signed)
? ?PRENATAL VISIT NOTE ? ?Subjective:  ?Amanda Church is a 36 y.o. G7P6006 at [redacted]w[redacted]d being seen today for ongoing prenatal care.  She is currently monitored for the following issues for this high-risk pregnancy and has Chronic bilateral low back pain with sciatica; Incontinence of feces; Chronic migraine w/o aura w/o status migrainosus, not intractable; Anemia; Iron deficiency anemia; Previous cesarean delivery affecting pregnancy; HGSIL (high grade squamous intraepithelial lesion) on Pap smear of cervix; History of gestational diabetes in prior pregnancy, currently pregnant; Supervision of high risk pregnancy, antepartum; Anxiety; History of 2 cesarean sections; Hx of cone biopsy of cervix; Group B streptococcal bacteriuria; Alpha thalassemia silent carrier; and Unwanted fertility on their problem list. ? ?Patient doing well with no acute concerns today. She reports  occasional lower abdominal pain .  Contractions: Irritability. Vag. Bleeding: None.  Movement: Present. Denies leaking of fluid.  ? ?The following portions of the patient's history were reviewed and updated as appropriate: allergies, current medications, past family history, past medical history, past social history, past surgical history and problem list. Problem list updated. ? ? ? ? ?Faculty Practice OB/GYN Attending Consult Note ? ?36 y.o. [id number] at [redacted]w[redacted]d with Estimated Date of Delivery: 08/18/21 was seen today in office to discuss trial of labor after cesarean section (TOLAC) versus elective repeat cesarean delivery (ERCD). The following risks were discussed with the patient. ? ?Risk of uterine rupture at term is 0.78 percent with TOLAC and 0.22 percent with ERCD. 1 in 10 uterine ruptures will result in neonatal death or neurological injury. ?The benefits of a trial of labor after cesarean (TOLAC) resulting in a vaginal birth after cesarean (VBAC) include the following: shorter length of hospital stay and postpartum recovery (in most cases);  fewer complications, such as postpartum fever, wound or uterine infection, thromboembolism (blood clots in the leg or lung), need for blood transfusion and fewer neonatal breathing problems. ?The risks of an attempted VBAC or TOLAC include the following: ?Risk of failed trial of labor after cesarean (TOLAC) without a vaginal birth after cesarean (VBAC) resulting in repeat cesarean delivery (RCD) in about 20 to 68 percent of women who attempt VBAC.  ?Risk of rupture of uterus resulting in an emergency cesarean delivery. The risk of uterine rupture may be related in part to the type of uterine incision made during the first cesarean delivery. A previous transverse uterine incision has the lowest risk of rupture (0.2 to 1.5 percent risk). Vertical or T-shaped uterine incisions have a higher risk of uterine rupture (4 to 9 percent risk)The risk of fetal death is very low with both VBAC and elective repeat cesarean delivery (ERCD), but the likelihood of fetal death is higher with VBAC than with ERCD. Maternal death is very rare with either type of delivery. ?The risks of an elective repeat cesarean delivery (ERCD) were reviewed with the patient including but not limited to: 04/998 risk of uterine rupture which could have serious consequences, bleeding which may require transfusion; infection which may require antibiotics; injury to bowel, bladder or other surrounding organs (bowel, bladder, ureters); injury to the fetus; need for additional procedures including hysterectomy in the event of a life-threatening hemorrhage; thromboembolic phenomenon; abnormal placentation; incisional problems; death and other postoperative or anesthesia complications.   ? ?These risks and benefits are summarized on the consent form, which was reviewed with the patient during the visit.  All her questions answered and she signed a consent indicating a preference for TOLAC/ERCD. A copy of the consent was given  to the patient. ? ?   ? ?Objective:  ? ?Vitals:  ? 07/04/21 0951  ?BP: 129/68  ?Pulse: (!) 108  ?Weight: 117.5 kg  ? ? ?Fetal Status: Fetal Heart Rate (bpm): 154 Fundal Height: 34 cm Movement: Present    ? ?General:  Alert, oriented and cooperative. Patient is in no acute distress.  ?Skin: Skin is warm and dry. No rash noted.   ?Cardiovascular: Normal heart rate noted  ?Respiratory: Normal respiratory effort, no problems with respiration noted  ?Abdomen: Soft, gravid, appropriate for gestational age.  Pain/Pressure: Present     ?Pelvic: Cervical exam deferred        ?Extremities: Normal range of motion.  Edema: Trace  ?Mental Status:  Normal mood and affect. Normal behavior. Normal judgment and thought content.  ? ?Assessment and Plan:  ?Pregnancy: [id number] at [redacted]w[redacted]d ? ?1. Supervision of high risk pregnancy, antepartum ?Continue routine care ?Pt not seen since 2/23 ?2 hour GTT tomorrow ? ?- Tdap vaccine greater than or equal to 7yo IM ? ?2. [redacted] weeks gestation of pregnancy ? ? ?3. History of 2 cesarean sections ?Pt still desires trial of VBAC, she understands she will not be induced, but we can allow TOLAC if spontaneous. ?Will schedule repeat c/s  at term if not already in labor ?VBAC consent signed ?Pt unsure regarding birth control intervention ? ? ?Preterm labor symptoms and general obstetric precautions including but not limited to vaginal bleeding, contractions, leaking of fluid and fetal movement were reviewed in detail with the patient. ? ?Please refer to After Visit Summary for other counseling recommendations.  ? ?Return in about 2 weeks (around 07/18/2021) for [facility], in person, 36 weeks swabs. ? ? ?Lynnda Shields, MD ?Faculty Attending ?Center for [place] ?  ?

## 2021-07-05 ENCOUNTER — Other Ambulatory Visit: Payer: Medicaid Other

## 2021-07-05 DIAGNOSIS — O0990 Supervision of high risk pregnancy, unspecified, unspecified trimester: Secondary | ICD-10-CM

## 2021-07-06 LAB — CBC
Hematocrit: 31.9 % — ABNORMAL LOW (ref 34.0–46.6)
Hemoglobin: 10.1 g/dL — ABNORMAL LOW (ref 11.1–15.9)
MCH: 22.4 pg — ABNORMAL LOW (ref 26.6–33.0)
MCHC: 31.7 g/dL (ref 31.5–35.7)
MCV: 71 fL — ABNORMAL LOW (ref 79–97)
Platelets: 256 10*3/uL (ref 150–450)
RBC: 4.5 x10E6/uL (ref 3.77–5.28)
RDW: 17.2 % — ABNORMAL HIGH (ref 11.7–15.4)
WBC: 7.6 10*3/uL (ref 3.4–10.8)

## 2021-07-06 LAB — RPR: RPR Ser Ql: NONREACTIVE

## 2021-07-06 LAB — GLUCOSE TOLERANCE, 2 HOURS W/ 1HR
Glucose, 1 hour: 193 mg/dL — ABNORMAL HIGH (ref 70–179)
Glucose, 2 hour: 144 mg/dL (ref 70–152)
Glucose, Fasting: 84 mg/dL (ref 70–91)

## 2021-07-06 LAB — HIV ANTIBODY (ROUTINE TESTING W REFLEX): HIV Screen 4th Generation wRfx: NONREACTIVE

## 2021-07-09 ENCOUNTER — Telehealth: Payer: Self-pay

## 2021-07-09 DIAGNOSIS — O24419 Gestational diabetes mellitus in pregnancy, unspecified control: Secondary | ICD-10-CM

## 2021-07-09 MED ORDER — ACCU-CHEK SOFTCLIX LANCETS MISC: 12 refills | Status: DC

## 2021-07-09 MED ORDER — GLUCOSE BLOOD VI STRP: ORAL_STRIP | 12 refills | Status: DC

## 2021-07-09 MED ORDER — ACCU-CHEK GUIDE W/DEVICE KIT: 1.0000 | PACK | Freq: Four times a day (QID) | 0 refills | Status: DC

## 2021-07-09 NOTE — Telephone Encounter (Signed)
Call placed to pt. Spoke with pt. Pt given results and recommendations per Dr Donavan Foil. Pt verbalized understanding and agreeable to plan of care.  ?Diabetes Supplies sent to pharmacy on file.  ?Diabetes ED appt on 5/11 at 1015am- Pt aware.  ?Pt instructed to check blood sugars 4 times a day and keep log and bring with appt to OB and Diabetes Ed appts.  ?Judeth Cornfield, RN  ?

## 2021-07-09 NOTE — Telephone Encounter (Signed)
-----   Message from Griffin Basil, MD sent at 07/06/2021 12:10 PM EDT ----- ?Failed 2 hour Gtt, needs diabetic teaching, third trimester labs normal ?

## 2021-07-12 ENCOUNTER — Other Ambulatory Visit: Payer: Self-pay | Admitting: Obstetrics

## 2021-07-12 ENCOUNTER — Other Ambulatory Visit: Payer: Self-pay | Admitting: *Deleted

## 2021-07-12 ENCOUNTER — Ambulatory Visit: Payer: Medicaid Other | Attending: Obstetrics | Admitting: *Deleted

## 2021-07-12 ENCOUNTER — Ambulatory Visit (HOSPITAL_BASED_OUTPATIENT_CLINIC_OR_DEPARTMENT_OTHER): Payer: Medicaid Other

## 2021-07-12 VITALS — BP 123/58 | HR 105

## 2021-07-12 DIAGNOSIS — O34219 Maternal care for unspecified type scar from previous cesarean delivery: Secondary | ICD-10-CM

## 2021-07-12 DIAGNOSIS — Z3A35 35 weeks gestation of pregnancy: Secondary | ICD-10-CM

## 2021-07-12 DIAGNOSIS — O285 Abnormal chromosomal and genetic finding on antenatal screening of mother: Secondary | ICD-10-CM

## 2021-07-12 DIAGNOSIS — O0943 Supervision of pregnancy with grand multiparity, third trimester: Secondary | ICD-10-CM | POA: Insufficient documentation

## 2021-07-12 DIAGNOSIS — O09293 Supervision of pregnancy with other poor reproductive or obstetric history, third trimester: Secondary | ICD-10-CM

## 2021-07-12 DIAGNOSIS — O09523 Supervision of elderly multigravida, third trimester: Secondary | ICD-10-CM | POA: Insufficient documentation

## 2021-07-12 DIAGNOSIS — O99213 Obesity complicating pregnancy, third trimester: Secondary | ICD-10-CM | POA: Diagnosis present

## 2021-07-12 DIAGNOSIS — O0942 Supervision of pregnancy with grand multiparity, second trimester: Secondary | ICD-10-CM

## 2021-07-12 DIAGNOSIS — D563 Thalassemia minor: Secondary | ICD-10-CM

## 2021-07-12 DIAGNOSIS — O0990 Supervision of high risk pregnancy, unspecified, unspecified trimester: Secondary | ICD-10-CM

## 2021-07-12 DIAGNOSIS — O3443 Maternal care for other abnormalities of cervix, third trimester: Secondary | ICD-10-CM

## 2021-07-12 DIAGNOSIS — O24419 Gestational diabetes mellitus in pregnancy, unspecified control: Secondary | ICD-10-CM

## 2021-07-12 DIAGNOSIS — E669 Obesity, unspecified: Secondary | ICD-10-CM

## 2021-07-12 DIAGNOSIS — Z6841 Body Mass Index (BMI) 40.0 and over, adult: Secondary | ICD-10-CM

## 2021-07-18 ENCOUNTER — Ambulatory Visit: Payer: Medicaid Other | Attending: Maternal & Fetal Medicine

## 2021-07-18 ENCOUNTER — Ambulatory Visit: Payer: Medicaid Other | Admitting: *Deleted

## 2021-07-18 VITALS — BP 125/60 | HR 108

## 2021-07-18 DIAGNOSIS — O99213 Obesity complicating pregnancy, third trimester: Secondary | ICD-10-CM | POA: Insufficient documentation

## 2021-07-18 DIAGNOSIS — F17200 Nicotine dependence, unspecified, uncomplicated: Secondary | ICD-10-CM

## 2021-07-18 DIAGNOSIS — Z3A36 36 weeks gestation of pregnancy: Secondary | ICD-10-CM

## 2021-07-18 DIAGNOSIS — O09523 Supervision of elderly multigravida, third trimester: Secondary | ICD-10-CM | POA: Insufficient documentation

## 2021-07-18 DIAGNOSIS — O24419 Gestational diabetes mellitus in pregnancy, unspecified control: Secondary | ICD-10-CM | POA: Diagnosis present

## 2021-07-18 DIAGNOSIS — O0990 Supervision of high risk pregnancy, unspecified, unspecified trimester: Secondary | ICD-10-CM | POA: Insufficient documentation

## 2021-07-18 DIAGNOSIS — O34219 Maternal care for unspecified type scar from previous cesarean delivery: Secondary | ICD-10-CM | POA: Insufficient documentation

## 2021-07-18 DIAGNOSIS — O99333 Smoking (tobacco) complicating pregnancy, third trimester: Secondary | ICD-10-CM

## 2021-07-18 DIAGNOSIS — O0943 Supervision of pregnancy with grand multiparity, third trimester: Secondary | ICD-10-CM | POA: Diagnosis not present

## 2021-07-18 DIAGNOSIS — O285 Abnormal chromosomal and genetic finding on antenatal screening of mother: Secondary | ICD-10-CM

## 2021-07-18 DIAGNOSIS — O3440 Maternal care for other abnormalities of cervix, unspecified trimester: Secondary | ICD-10-CM

## 2021-07-18 DIAGNOSIS — D563 Thalassemia minor: Secondary | ICD-10-CM

## 2021-07-18 DIAGNOSIS — O09293 Supervision of pregnancy with other poor reproductive or obstetric history, third trimester: Secondary | ICD-10-CM

## 2021-07-19 ENCOUNTER — Encounter: Payer: Medicaid Other | Attending: Obstetrics and Gynecology | Admitting: Registered"

## 2021-07-19 ENCOUNTER — Other Ambulatory Visit: Payer: Self-pay | Admitting: Lactation Services

## 2021-07-19 ENCOUNTER — Ambulatory Visit (INDEPENDENT_AMBULATORY_CARE_PROVIDER_SITE_OTHER): Payer: Medicaid Other | Admitting: Registered"

## 2021-07-19 DIAGNOSIS — O24419 Gestational diabetes mellitus in pregnancy, unspecified control: Secondary | ICD-10-CM

## 2021-07-19 DIAGNOSIS — Z713 Dietary counseling and surveillance: Secondary | ICD-10-CM | POA: Insufficient documentation

## 2021-07-19 DIAGNOSIS — Z3A00 Weeks of gestation of pregnancy not specified: Secondary | ICD-10-CM | POA: Diagnosis not present

## 2021-07-19 NOTE — Progress Notes (Signed)
Patient was seen for Gestational Diabetes self-management on 07/19/21  ?Start time 1015 and End time 1113  ? ?Estimated due date: 08/18/21; [redacted]w[redacted]d ? ?Clinical: ?Medications: prenatal, aspirin, ferrous sulfate ?Medical History: previous diet controlled GDM ?Labs: OGTT [phone], A1c 6.0%  ? ?Dietary and Lifestyle History: ?Pt states she loves vegetables, doesn't eat a lot of meat. Pt states she was told by another provider she is not supposed to eat bread, not sweet drinks, and only fruit allowed is berries. Pt states the only berry she likes is strawberries. Pt states she likes most other fruits. Pt states she doesn't drink regular milk because it hurts stomach ? ?Pt states sometimes difficult to make healthy food choices when eating out because they have 6 kids and gets expensive.  ? ?Physical Activity: not assessed ?Stress: not assessed ?Sleep: not assessed ? ?24 hr Recall:  ?First Meal: bacon, egg, cheese biscuit, coffe w/ stevia, water ?Snack:none ?Second meal: 1/2 personal pan pizza, water ?Snack: 5 chips ?Third meal: 2c spaghetti sauce with ground beef, Brussels sprouts ?Snack: none ?Beverages: water sometimes with crystal light, coffee, uses oatmilk on cereal. ? ?NUTRITION INTERVENTION  ?Nutrition education (E-1) on the following topics:  ? ?Initial Follow-up ? ?[x]  []  Definition of Gestational Diabetes ?[x]  []  Why dietary management is important in controlling blood glucose ?[x]  []  Effects each nutrient has on blood glucose levels ?[x]  []  Simple carbohydrates vs complex carbohydrates ?[x]  []  Fluid intake ?[x]  []  Creating a balanced meal plan ?[x]  []  Carbohydrate counting  ?[x]  []  When to check blood glucose levels ?[x]  []  Proper blood glucose monitoring techniques ?[x]  []  Effect of stress and stress reduction techniques  ?[x]  []  Exercise effect on blood glucose levels, appropriate exercise during pregnancy ?[x]  []  Importance of limiting caffeine and abstaining from alcohol and smoking ?[x]  []  Medications  used for blood sugar control during pregnancy ?[x]  []  Hypoglycemia and rule of 15 ?[x]  []  Postpartum self care ? ?Patient already has a meter, is testing pre breakfast and 2 hours after each meal. ?FBS: 80-97 mg/dL ?Postprandial: 99-139 mg/dL ? ?Patient instructed to monitor glucose levels: ?FBS: 60 - ? 95 mg/dL (some clinics use 90 for cutoff) ?1 hour: ? 140 mg/dL ?2 hour: ? mg/dL ? ?Patient received handouts: ?Nutrition Diabetes and Pregnancy ?Carbohydrate Counting List ? ?Patient will be seen for follow-up as needed.  ?

## 2021-07-20 ENCOUNTER — Ambulatory Visit (INDEPENDENT_AMBULATORY_CARE_PROVIDER_SITE_OTHER): Payer: Medicaid Other | Admitting: Obstetrics and Gynecology

## 2021-07-20 VITALS — BP 129/60 | HR 100 | Wt 260.5 lb

## 2021-07-20 DIAGNOSIS — O24419 Gestational diabetes mellitus in pregnancy, unspecified control: Secondary | ICD-10-CM | POA: Insufficient documentation

## 2021-07-20 DIAGNOSIS — Z8759 Personal history of other complications of pregnancy, childbirth and the puerperium: Secondary | ICD-10-CM

## 2021-07-20 DIAGNOSIS — Z98891 History of uterine scar from previous surgery: Secondary | ICD-10-CM

## 2021-07-20 DIAGNOSIS — R8271 Bacteriuria: Secondary | ICD-10-CM

## 2021-07-20 DIAGNOSIS — Z3A35 35 weeks gestation of pregnancy: Secondary | ICD-10-CM

## 2021-07-20 DIAGNOSIS — D563 Thalassemia minor: Secondary | ICD-10-CM

## 2021-07-20 DIAGNOSIS — O0990 Supervision of high risk pregnancy, unspecified, unspecified trimester: Secondary | ICD-10-CM

## 2021-07-20 DIAGNOSIS — R87613 High grade squamous intraepithelial lesion on cytologic smear of cervix (HGSIL): Secondary | ICD-10-CM

## 2021-07-20 NOTE — Progress Notes (Signed)
? ?  PRENATAL VISIT NOTE ? ?Subjective:  ?Amanda Church is a 36 y.o. G7P6006 at [redacted]w[redacted]d being seen today for ongoing prenatal care.  She is currently monitored for the following issues for this high-risk pregnancy and has Chronic bilateral low back pain with sciatica; Incontinence of feces; Chronic migraine w/o aura w/o status migrainosus, not intractable; Anemia; Iron deficiency anemia; Previous cesarean delivery affecting pregnancy; HGSIL (high grade squamous intraepithelial lesion) on Pap smear of cervix; History of gestational diabetes in prior pregnancy, currently pregnant; Supervision of high risk pregnancy, antepartum; Anxiety; History of 2 cesarean sections; Hx of cone biopsy of cervix; Group B streptococcal bacteriuria; Alpha thalassemia silent carrier; Unwanted fertility; History of placenta abruption; and Gestational diabetes mellitus (GDM) affecting pregnancy on their problem list. ? ?Patient doing well with no acute concerns today. She reports no complaints.  Contractions: Irritability. Vag. Bleeding: None.  Movement: Present. Denies leaking of fluid.  ? ?The following portions of the patient's history were reviewed and updated as appropriate: allergies, current medications, past family history, past medical history, past social history, past surgical history and problem list. Problem list updated. ? ?Objective:  ? ?Vitals:  ? 07/20/21 1006  ?BP: 129/60  ?Pulse: 100  ?Weight: 118.2 kg  ? ? ?Fetal Status: Fetal Heart Rate (bpm): 149 Fundal Height: 38 cm Movement: Present    ? ?General:  Alert, oriented and cooperative. Patient is in no acute distress.  ?Skin: Skin is warm and dry. No rash noted.   ?Cardiovascular: Normal heart rate noted  ?Respiratory: Normal respiratory effort, no problems with respiration noted  ?Abdomen: Soft, gravid, appropriate for gestational age.  Pain/Pressure: Present     ?Pelvic: Cervical exam deferred        ?Extremities: Normal range of motion.  Edema: Trace  ?Mental Status:   Normal mood and affect. Normal behavior. Normal judgment and thought content.  ? ?Assessment and Plan:  ?Pregnancy: [id number] at [redacted]w[redacted]d ? ?1. [redacted] weeks gestation of pregnancy ? ? ?2. Gestational diabetes mellitus (GDM) affecting pregnancy ?FBS: 87-97 ?PPBS: 99-107 ?Pt advised to record and bring in blood sugars for surveillance ? ?3. History of placenta abruption ? ? ?4. History of 2 cesarean sections ?Discussed in detail that patient can have VBAC with 2 c sections, but will not be induced, pt currently has c section scheduled at 40 weeks ? ?5. Supervision of high risk pregnancy, antepartum ?Continue routine care ?GC/C next visit ? ?6. HGSIL (high grade squamous intraepithelial lesion) on Pap smear of cervix ?Colpo after delivery ? ?7. Group B streptococcal bacteriuria ?If vaginal delivery, prophylaxis in labor ? ?8. Alpha thalassemia silent carrier ? ? ?Preterm labor symptoms and general obstetric precautions including but not limited to vaginal bleeding, contractions, leaking of fluid and fetal movement were reviewed in detail with the patient. ? ?Please refer to After Visit Summary for other counseling recommendations.  ? ?Return in about 1 week (around 07/27/2021) for [facility], in person, 36 weeks swabs. ? ? ?Lynnda Shields, MD ?Faculty Attending ?[facility] ?  ?

## 2021-07-20 NOTE — Progress Notes (Signed)
Pt did not bring Glucose logs ?

## 2021-07-26 ENCOUNTER — Telehealth: Payer: Medicaid Other | Admitting: Obstetrics and Gynecology

## 2021-07-26 NOTE — Progress Notes (Signed)
PRENATAL VISIT NOTE  Subjective:  Amanda Church is a 36 y.o. G7P6006 at [redacted]w[redacted]d being seen today for ongoing prenatal care.  She is currently monitored for the following issues for this high-risk pregnancy and has Chronic bilateral low back pain with sciatica; Incontinence of feces; Chronic migraine w/o aura w/o status migrainosus, not intractable; Iron deficiency anemia; Previous cesarean delivery affecting pregnancy; HGSIL (high grade squamous intraepithelial lesion) on Pap smear of cervix; Supervision of high risk pregnancy, antepartum; Anxiety; History of 2 cesarean sections; Hx of cone biopsy of cervix; Group B streptococcal bacteriuria; Alpha thalassemia silent carrier; Unwanted fertility; History of placenta abruption; and Gestational diabetes mellitus (GDM) affecting pregnancy on their problem list.  Patient reports no complaints.  Contractions: Irritability. Vag. Bleeding: None.  Movement: Present. Denies leaking of fluid.   The following portions of the patient's history were reviewed and updated as appropriate: allergies, current medications, past family history, past medical history, past social history, past surgical history and problem list.   Objective:   Vitals:   07/27/21 1115  BP: 128/64  Pulse: 97  Weight: 259 lb 4.8 oz (117.6 kg)    Fetal Status:     Movement: Present     General:  Alert, oriented and cooperative. Patient is in no acute distress.  Skin: Skin is warm and dry. No rash noted.   Cardiovascular: Normal heart rate noted  Respiratory: Normal respiratory effort, no problems with respiration noted  Abdomen: Soft, gravid, appropriate for gestational age.  Pain/Pressure: Present     Pelvic: Cervical exam performed in the presence of a chaperone        Extremities: Normal range of motion.  Edema: Trace  Mental Status: Normal mood and affect. Normal behavior. Normal judgment and thought content.   Assessment and Plan:  Pregnancy: G7P6006 at [redacted]w[redacted]d 1.  Gestational diabetes mellitus (GDM) affecting pregnancy CBGs reviewed: Didn't bring her log. Fastings <95 and Postmeals are all normal, usually around 109.  Growth [place] 5/9: 72%ile, 2820g, AC=HC, normal afi, cephalic Discussed timing of delivery. She would like to go into labor and does not want a c-section. She has had 4 SVD in the past. Had emergency c-section with one and then felt pressured into her second c-section. Does not want the recovery of c-section. Discussed we will see how GDM progresses and also encouraged birthing ball and walking. At ~39w can do membrane sweep if dilated. We can revisit closer to time of delivery.   2. History of placenta abruption  3. Iron deficiency anemia due to chronic blood loss HgB was 10.1. Will recheck today.   4. Supervision of high risk pregnancy, antepartum GC/CT done today via self swab.   5. History of 2 cesarean sections Desires TOLAC s/p consent. No IOL per protocol. Has h/o successful SVD.   6. Group B streptococcal bacteriuria PCN in labor  7. Unwanted fertility She does not want tubal at this time although plans no more kids. Does not want hormonal birth control.   Preterm labor symptoms and general obstetric precautions including but not limited to vaginal bleeding, contractions, leaking of fluid and fetal movement were reviewed in detail with the patient. Please refer to After Visit Summary for other counseling recommendations.   No follow-ups on file.  Future Appointments  Date Time Provider Department Center  07/27/2021  1:45 PM [facility] NURSE [facility]  07/27/2021  2:00 PM [facility] US1 [facility] [place] [facility]  08/03/2021  9:55 AM Adam Phenix, MD [facility]  08/03/2021 10:15  AM [facility] NURSE [facility]  08/03/2021 10:30 AM [facility] US2 [facility]    Milas Hock, MD

## 2021-07-27 ENCOUNTER — Ambulatory Visit (INDEPENDENT_AMBULATORY_CARE_PROVIDER_SITE_OTHER): Payer: Medicaid Other | Admitting: Obstetrics and Gynecology

## 2021-07-27 ENCOUNTER — Ambulatory Visit: Payer: Medicaid Other

## 2021-07-27 ENCOUNTER — Encounter: Payer: Self-pay | Admitting: Hematology

## 2021-07-27 ENCOUNTER — Other Ambulatory Visit (HOSPITAL_COMMUNITY)
Admission: RE | Admit: 2021-07-27 | Discharge: 2021-07-27 | Disposition: A | Discharge status: Home / Self Care | Payer: Medicaid Other | Source: Ambulatory Visit | Attending: Obstetrics and Gynecology | Admitting: Obstetrics and Gynecology

## 2021-07-27 VITALS — BP 128/64 | HR 97 | Wt 259.3 lb

## 2021-07-27 DIAGNOSIS — O0990 Supervision of high risk pregnancy, unspecified, unspecified trimester: Secondary | ICD-10-CM

## 2021-07-27 DIAGNOSIS — D500 Iron deficiency anemia secondary to blood loss (chronic): Secondary | ICD-10-CM

## 2021-07-27 DIAGNOSIS — Z3009 Encounter for other general counseling and advice on contraception: Secondary | ICD-10-CM

## 2021-07-27 DIAGNOSIS — R8271 Bacteriuria: Secondary | ICD-10-CM

## 2021-07-27 DIAGNOSIS — O24419 Gestational diabetes mellitus in pregnancy, unspecified control: Secondary | ICD-10-CM

## 2021-07-27 DIAGNOSIS — Z8759 Personal history of other complications of pregnancy, childbirth and the puerperium: Secondary | ICD-10-CM

## 2021-07-27 DIAGNOSIS — Z3A00 Weeks of gestation of pregnancy not specified: Secondary | ICD-10-CM | POA: Insufficient documentation

## 2021-07-27 DIAGNOSIS — Z98891 History of uterine scar from previous surgery: Secondary | ICD-10-CM

## 2021-07-27 LAB — CBC
Hematocrit: 30.4 % — ABNORMAL LOW (ref 34.0–46.6)
Hemoglobin: 9.4 g/dL — ABNORMAL LOW (ref 11.1–15.9)
MCH: 22.1 pg — ABNORMAL LOW (ref 26.6–33.0)
MCHC: 30.9 g/dL — ABNORMAL LOW (ref 31.5–35.7)
MCV: 71 fL — ABNORMAL LOW (ref 79–97)
Platelets: 265 10*3/uL (ref 150–450)
RBC: 4.26 x10E6/uL (ref 3.77–5.28)
RDW: 18.2 % — ABNORMAL HIGH (ref 11.7–15.4)
WBC: 5.8 10*3/uL (ref 3.4–10.8)

## 2021-07-29 ENCOUNTER — Other Ambulatory Visit: Payer: Self-pay | Admitting: Obstetrics and Gynecology

## 2021-07-29 DIAGNOSIS — O24419 Gestational diabetes mellitus in pregnancy, unspecified control: Secondary | ICD-10-CM | POA: Insufficient documentation

## 2021-07-29 DIAGNOSIS — Z01818 Encounter for other preprocedural examination: Secondary | ICD-10-CM

## 2021-07-29 MED ORDER — CEFAZOLIN SODIUM-DEXTROSE 2-4 GM/100ML-% IV SOLN: 2.0000 g | INTRAVENOUS | Status: DC

## 2021-07-29 MED ORDER — SOD CITRATE-CITRIC ACID 500-334 MG/5ML PO SOLN: 30.0000 mL | ORAL | Status: DC

## 2021-07-30 ENCOUNTER — Other Ambulatory Visit: Payer: Self-pay

## 2021-07-30 ENCOUNTER — Ambulatory Visit: Payer: Medicaid Other

## 2021-07-30 LAB — GC/CHLAMYDIA PROBE AMP (~~LOC~~) NOT AT ARMC
Chlamydia: NEGATIVE
Comment: NEGATIVE
Comment: NORMAL
Neisseria Gonorrhea: NEGATIVE

## 2021-07-31 ENCOUNTER — Telehealth: Payer: Self-pay | Admitting: Pharmacy Technician

## 2021-07-31 ENCOUNTER — Ambulatory Visit (HOSPITAL_COMMUNITY)
Admission: RE | Admit: 2021-07-31 | Discharge: 2021-07-31 | Disposition: A | Discharge status: Home / Self Care | Payer: Medicaid Other | Source: Ambulatory Visit | Attending: Family Medicine | Admitting: Family Medicine

## 2021-07-31 ENCOUNTER — Other Ambulatory Visit: Payer: Self-pay | Admitting: Obstetrics and Gynecology

## 2021-07-31 ENCOUNTER — Telehealth: Payer: Self-pay | Admitting: General Practice

## 2021-07-31 DIAGNOSIS — M79661 Pain in right lower leg: Secondary | ICD-10-CM

## 2021-07-31 NOTE — Telephone Encounter (Signed)
Auth Submission: no auth needed Payer: [place] medicaid Medication & CPT/J Code(s) submitted: Venofer (Iron Sucrose) J1756 Route of submission (phone, fax, portal): phone Auth type: Buy/Bill Units/visits requested: x2 doses Reference number: [id number]

## 2021-07-31 NOTE — Telephone Encounter (Signed)
Patient called into office stating she woke up with horrible right calf pain that isn't going away. Patient states the pain is mostly dull/achy but at times is much worse and feels like things are "locking in place"- rates pain at a 8. She is unsure if she has swelling in that leg or erythema. Patient denies shortness of breath or chest pain. Discussed with Dr Mathis Fare who states patient needs bilateral dopplers outpatient or can go to [facility]U. Discussed with patient who would like outpatient studies. Scheduled with [name] at 11am today and informed patient. Told patient we will follow up with her for the results. Patient verbalized understanding.

## 2021-08-02 ENCOUNTER — Telehealth: Payer: Self-pay | Admitting: Family Medicine

## 2021-08-02 NOTE — Telephone Encounter (Signed)
Call placed to pt. Spoke with pt. Pt states when called the office this morning she was having trouble walking due to vaginal pressure, but now when called back to pt. Pt states has gotten better now and able to walk and the pressure is less. Pt also having  Irregular ctx, unable to time them due to irregularly. Pt states +FM. Pt has ROB appt tomorrow. Pt instructed to go to hospital if has more regular ctx, leaking any fluid or having any bleeding. Pt verbalized understanding.  Shelda Pal

## 2021-08-02 NOTE — Telephone Encounter (Signed)
Patient is experiencing a lot of pain in lower abdomin. Patient would like a nurse to call back.

## 2021-08-03 ENCOUNTER — Ambulatory Visit: Payer: Medicaid Other | Attending: Obstetrics

## 2021-08-03 ENCOUNTER — Other Ambulatory Visit: Payer: Self-pay | Admitting: *Deleted

## 2021-08-03 ENCOUNTER — Encounter: Payer: Self-pay | Admitting: Obstetrics & Gynecology

## 2021-08-03 ENCOUNTER — Ambulatory Visit (INDEPENDENT_AMBULATORY_CARE_PROVIDER_SITE_OTHER): Payer: Medicaid Other | Admitting: Obstetrics & Gynecology

## 2021-08-03 ENCOUNTER — Ambulatory Visit: Payer: Medicaid Other | Admitting: *Deleted

## 2021-08-03 VITALS — BP 120/61 | HR 106

## 2021-08-03 VITALS — BP 134/63 | HR 105 | Wt 257.1 lb

## 2021-08-03 DIAGNOSIS — O09523 Supervision of elderly multigravida, third trimester: Secondary | ICD-10-CM

## 2021-08-03 DIAGNOSIS — O3443 Maternal care for other abnormalities of cervix, third trimester: Secondary | ICD-10-CM | POA: Diagnosis not present

## 2021-08-03 DIAGNOSIS — O0943 Supervision of pregnancy with grand multiparity, third trimester: Secondary | ICD-10-CM | POA: Diagnosis not present

## 2021-08-03 DIAGNOSIS — O99213 Obesity complicating pregnancy, third trimester: Secondary | ICD-10-CM

## 2021-08-03 DIAGNOSIS — F17210 Nicotine dependence, cigarettes, uncomplicated: Secondary | ICD-10-CM

## 2021-08-03 DIAGNOSIS — O99333 Smoking (tobacco) complicating pregnancy, third trimester: Secondary | ICD-10-CM

## 2021-08-03 DIAGNOSIS — O24419 Gestational diabetes mellitus in pregnancy, unspecified control: Secondary | ICD-10-CM

## 2021-08-03 DIAGNOSIS — D563 Thalassemia minor: Secondary | ICD-10-CM

## 2021-08-03 DIAGNOSIS — Z98891 History of uterine scar from previous surgery: Secondary | ICD-10-CM

## 2021-08-03 DIAGNOSIS — O0990 Supervision of high risk pregnancy, unspecified, unspecified trimester: Secondary | ICD-10-CM

## 2021-08-03 DIAGNOSIS — O285 Abnormal chromosomal and genetic finding on antenatal screening of mother: Secondary | ICD-10-CM

## 2021-08-03 DIAGNOSIS — Z3A38 38 weeks gestation of pregnancy: Secondary | ICD-10-CM

## 2021-08-03 DIAGNOSIS — O34219 Maternal care for unspecified type scar from previous cesarean delivery: Secondary | ICD-10-CM

## 2021-08-03 DIAGNOSIS — E669 Obesity, unspecified: Secondary | ICD-10-CM

## 2021-08-03 DIAGNOSIS — Z3009 Encounter for other general counseling and advice on contraception: Secondary | ICD-10-CM

## 2021-08-03 DIAGNOSIS — O09293 Supervision of pregnancy with other poor reproductive or obstetric history, third trimester: Secondary | ICD-10-CM

## 2021-08-03 NOTE — Progress Notes (Signed)
   PRENATAL VISIT NOTE  Subjective:  Amanda Church is a 36 y.o. G7P6006 at [redacted]w[redacted]d being seen today for ongoing prenatal care.  She is currently monitored for the following issues for this high-risk pregnancy and has Chronic bilateral low back pain with sciatica; Incontinence of feces; Chronic migraine w/o aura w/o status migrainosus, not intractable; Iron deficiency anemia; Previous cesarean delivery affecting pregnancy; HGSIL (high grade squamous intraepithelial lesion) on Pap smear of cervix; Supervision of high risk pregnancy, antepartum; Anxiety; History of 2 cesarean sections; Hx of cone biopsy of cervix; Group B streptococcal bacteriuria; Alpha thalassemia silent carrier; Unwanted fertility; History of placenta abruption; Gestational diabetes mellitus (GDM) affecting pregnancy; and GDM (gestational diabetes mellitus) on their problem list.  Patient reports occasional contractions.  Contractions: Irritability. Vag. Bleeding: None.  Movement: Present. Denies leaking of fluid.   The following portions of the patient's history were reviewed and updated as appropriate: allergies, current medications, past family history, past medical history, past social history, past surgical history and problem list.   Objective:   Vitals:   08/03/21 1000  BP: 134/63  Pulse: (!) 105  Weight: 257 lb 1.6 oz (116.6 kg)    Fetal Status: Fetal Heart Rate (bpm): 147   Movement: Present  Presentation: Vertex  General:  Alert, oriented and cooperative. Patient is in no acute distress.  Skin: Skin is warm and dry. No rash noted.   Cardiovascular: Normal heart rate noted  Respiratory: Normal respiratory effort, no problems with respiration noted  Abdomen: Soft, gravid, appropriate for gestational age.  Pain/Pressure: Present     Pelvic: Cervical exam performed in the presence of a chaperone Dilation: 1 Effacement (%): 40 Station: Ballotable  Extremities: Normal range of motion.  Edema: Trace  Mental Status:  Normal mood and affect. Normal behavior. Normal judgment and thought content.   Assessment and Plan:  Pregnancy: [id number] at [redacted]w[redacted]d 1. Previous cesarean delivery affecting pregnancy States all BG in rang3  2. History of 2 cesarean sections TOLAC if labor  3. Gestational diabetes mellitus (GDM) in third trimester, gestational diabetes method of control unspecified F/u US  4. Supervision of high risk pregnancy, antepartum   5. Unwanted fertility   Term labor symptoms and general obstetric precautions including but not limited to vaginal bleeding, contractions, leaking of fluid and fetal movement were reviewed in detail with the patient. Please refer to After Visit Summary for other counseling recommendations.   Return in about 1 week (around 08/10/2021).  Future Appointments  Date Time Provider [place]  08/09/2021  8:45 AM [facility] 1 [facility] None    Emeterio Reeve, MD

## 2021-08-07 ENCOUNTER — Encounter (HOSPITAL_COMMUNITY): Payer: Self-pay

## 2021-08-07 NOTE — Patient Instructions (Addendum)
Amanda Church  08/07/2021   Your procedure is scheduled on:  08/18/2021  Arrive at 0730 at [employer] [facility] on [address] at [facility]  and [employer]. You are invited to use the FREE valet parking or use the Visitor's parking deck.  Pick up the phone at the desk and dial [phone].  Call this number if you have problems the morning of surgery: [phone]  Remember:   Do not eat food:(After Midnight) Desps de medianoche.  Do not drink clear liquids: (After Midnight) Desps de medianoche.  Take these medicines the morning of surgery with A SIP OF WATER:  Take singulair as prescribed   Do not wear jewelry, make-up or nail polish.  Do not wear lotions, powders, or perfumes. Do not wear deodorant.  Do not shave 48 hours prior to surgery.  Do not bring valuables to the hospital.  [facility] is not   responsible for any belongings or valuables brought to the hospital.  Contacts, dentures or bridgework may not be worn into surgery.  Leave suitcase in the car. After surgery it may be brought to your room.  For patients admitted to the hospital, checkout time is 11:00 AM the day of              discharge.      Please read over the following fact sheets that you were given:     Preparing for Surgery

## 2021-08-09 ENCOUNTER — Ambulatory Visit (INDEPENDENT_AMBULATORY_CARE_PROVIDER_SITE_OTHER): Payer: Medicaid Other

## 2021-08-09 VITALS — BP 133/88 | HR 80 | Temp 98.3°F | Resp 18 | Ht 61.0 in | Wt 258.0 lb

## 2021-08-09 DIAGNOSIS — O99013 Anemia complicating pregnancy, third trimester: Secondary | ICD-10-CM | POA: Diagnosis not present

## 2021-08-09 DIAGNOSIS — O0990 Supervision of high risk pregnancy, unspecified, unspecified trimester: Secondary | ICD-10-CM

## 2021-08-09 DIAGNOSIS — D508 Other iron deficiency anemias: Secondary | ICD-10-CM

## 2021-08-09 MED ORDER — SODIUM CHLORIDE 0.9 % IV SOLN
500.0000 mg | Freq: Once | INTRAVENOUS | Status: AC
  Administered 2021-08-09: 500 mg via INTRAVENOUS
  Filled 2021-08-09: qty 25

## 2021-08-09 NOTE — Progress Notes (Signed)
Diagnosis: Iron Deficiency Anemia  Provider:  Chilton Greathouse, MD  Procedure: Infusion  IV Type: Peripheral, IV Location: L Forearm  Venofer (Iron Sucrose), Dose: 500 mg  Infusion Start Time: 0920  Infusion Stop Time: 1310  Post Infusion IV Care: Observation period completed and Peripheral IV Discontinued  Discharge: Condition: Good, Destination: Home . AVS provided to patient.   Performed by:  Loney Hering, LPN

## 2021-08-10 ENCOUNTER — Ambulatory Visit: Payer: Medicaid Other

## 2021-08-10 ENCOUNTER — Ambulatory Visit: Payer: Medicaid Other | Admitting: *Deleted

## 2021-08-10 ENCOUNTER — Ambulatory Visit: Payer: Medicaid Other | Attending: Obstetrics | Admitting: *Deleted

## 2021-08-10 ENCOUNTER — Encounter: Payer: Self-pay | Admitting: *Deleted

## 2021-08-10 VITALS — BP 125/69 | HR 115

## 2021-08-10 DIAGNOSIS — O24419 Gestational diabetes mellitus in pregnancy, unspecified control: Secondary | ICD-10-CM | POA: Insufficient documentation

## 2021-08-10 DIAGNOSIS — O0990 Supervision of high risk pregnancy, unspecified, unspecified trimester: Secondary | ICD-10-CM

## 2021-08-10 DIAGNOSIS — Z3A38 38 weeks gestation of pregnancy: Secondary | ICD-10-CM | POA: Diagnosis not present

## 2021-08-10 DIAGNOSIS — Z8759 Personal history of other complications of pregnancy, childbirth and the puerperium: Secondary | ICD-10-CM | POA: Diagnosis not present

## 2021-08-10 DIAGNOSIS — O09513 Supervision of elderly primigravida, third trimester: Secondary | ICD-10-CM | POA: Insufficient documentation

## 2021-08-10 DIAGNOSIS — O99213 Obesity complicating pregnancy, third trimester: Secondary | ICD-10-CM | POA: Diagnosis not present

## 2021-08-10 DIAGNOSIS — O0943 Supervision of pregnancy with grand multiparity, third trimester: Secondary | ICD-10-CM | POA: Diagnosis not present

## 2021-08-10 NOTE — Procedures (Signed)
Amanda Church 1985/10/16 [redacted]w[redacted]d  Fetus A Non-Stress Test Interpretation for 08/10/21  Indication: Gestational Diabetes medication controlled  Fetal Heart Rate A Mode: External Baseline Rate (A): 155 bpm Variability: Moderate Accelerations: 15 x 15 Decelerations: None Multiple birth?: No  Uterine Activity Mode: Toco Contraction Frequency (min): occas Contraction Duration (sec): 40-60 Contraction Quality: Mild Resting Tone Palpated: Relaxed  Interpretation (Fetal Testing) Nonstress Test Interpretation: Reactive Overall Impression: Reassuring for gestational age Comments: tracing reviewed by Dr. Grace Bushy

## 2021-08-15 ENCOUNTER — Encounter (HOSPITAL_COMMUNITY): Payer: Self-pay | Admitting: Obstetrics & Gynecology

## 2021-08-15 ENCOUNTER — Inpatient Hospital Stay (HOSPITAL_COMMUNITY)
Admission: AD | Admit: 2021-08-15 | Discharge: 2021-08-19 | DRG: 807 | Disposition: A | Discharge status: Home / Self Care | Payer: Medicaid Other | Attending: Obstetrics and Gynecology | Admitting: Obstetrics and Gynecology

## 2021-08-15 ENCOUNTER — Other Ambulatory Visit: Payer: Self-pay

## 2021-08-15 DIAGNOSIS — R87613 High grade squamous intraepithelial lesion on cytologic smear of cervix (HGSIL): Secondary | ICD-10-CM | POA: Diagnosis present

## 2021-08-15 DIAGNOSIS — O99824 Streptococcus B carrier state complicating childbirth: Secondary | ICD-10-CM | POA: Diagnosis present

## 2021-08-15 DIAGNOSIS — O99334 Smoking (tobacco) complicating childbirth: Secondary | ICD-10-CM | POA: Diagnosis present

## 2021-08-15 DIAGNOSIS — Z98891 History of uterine scar from previous surgery: Secondary | ICD-10-CM

## 2021-08-15 DIAGNOSIS — Z86001 Personal history of in-situ neoplasm of cervix uteri: Secondary | ICD-10-CM

## 2021-08-15 DIAGNOSIS — Z3A39 39 weeks gestation of pregnancy: Secondary | ICD-10-CM | POA: Diagnosis not present

## 2021-08-15 DIAGNOSIS — D563 Thalassemia minor: Secondary | ICD-10-CM | POA: Diagnosis present

## 2021-08-15 DIAGNOSIS — O24419 Gestational diabetes mellitus in pregnancy, unspecified control: Secondary | ICD-10-CM | POA: Diagnosis present

## 2021-08-15 DIAGNOSIS — O99214 Obesity complicating childbirth: Secondary | ICD-10-CM | POA: Diagnosis present

## 2021-08-15 DIAGNOSIS — Z3009 Encounter for other general counseling and advice on contraception: Secondary | ICD-10-CM | POA: Diagnosis present

## 2021-08-15 DIAGNOSIS — D509 Iron deficiency anemia, unspecified: Secondary | ICD-10-CM | POA: Diagnosis present

## 2021-08-15 DIAGNOSIS — F17210 Nicotine dependence, cigarettes, uncomplicated: Secondary | ICD-10-CM | POA: Diagnosis present

## 2021-08-15 DIAGNOSIS — O9902 Anemia complicating childbirth: Secondary | ICD-10-CM | POA: Diagnosis present

## 2021-08-15 DIAGNOSIS — O368130 Decreased fetal movements, third trimester, not applicable or unspecified: Principal | ICD-10-CM | POA: Diagnosis present

## 2021-08-15 DIAGNOSIS — O34211 Maternal care for low transverse scar from previous cesarean delivery: Secondary | ICD-10-CM | POA: Diagnosis not present

## 2021-08-15 DIAGNOSIS — O24420 Gestational diabetes mellitus in childbirth, diet controlled: Secondary | ICD-10-CM | POA: Diagnosis present

## 2021-08-15 DIAGNOSIS — O34219 Maternal care for unspecified type scar from previous cesarean delivery: Secondary | ICD-10-CM | POA: Diagnosis present

## 2021-08-15 LAB — TYPE AND SCREEN
ABO/RH(D): A POS
Antibody Screen: NEGATIVE

## 2021-08-15 LAB — URINALYSIS, ROUTINE W REFLEX MICROSCOPIC
Bilirubin Urine: NEGATIVE
Glucose, UA: NEGATIVE mg/dL
Hgb urine dipstick: NEGATIVE
Ketones, ur: NEGATIVE mg/dL
Nitrite: NEGATIVE
Protein, ur: NEGATIVE mg/dL
Specific Gravity, Urine: 1.021 (ref 1.005–1.030)
WBC, UA: >50 WBC/hpf — ABNORMAL HIGH (ref 0–5)
pH: 5 (ref 5.0–8.0)

## 2021-08-15 LAB — CBC
HCT: 32.8 % — ABNORMAL LOW (ref 36.0–46.0)
Hemoglobin: 9.7 g/dL — ABNORMAL LOW (ref 12.0–15.0)
MCH: 22.7 pg — ABNORMAL LOW (ref 26.0–34.0)
MCHC: 29.6 g/dL — ABNORMAL LOW (ref 30.0–36.0)
MCV: 76.8 fL — ABNORMAL LOW (ref 80.0–100.0)
Platelets: 261 10*3/uL (ref 150–400)
RBC: 4.27 MIL/uL (ref 3.87–5.11)
RDW: 21 % — ABNORMAL HIGH (ref 11.5–15.5)
WBC: 7.1 10*3/uL (ref 4.0–10.5)
nRBC: 0.6 % — ABNORMAL HIGH (ref 0.0–0.2)

## 2021-08-15 LAB — GLUCOSE, CAPILLARY: Glucose-Capillary: 105 mg/dL — ABNORMAL HIGH (ref 70–99)

## 2021-08-15 MED ORDER — OXYCODONE-ACETAMINOPHEN 5-325 MG PO TABS: 2.0000 | ORAL_TABLET | ORAL | Status: DC | PRN

## 2021-08-15 MED ORDER — ONDANSETRON HCL 4 MG/2ML IJ SOLN
4.0000 mg | Freq: Four times a day (QID) | INTRAMUSCULAR | Status: DC | PRN
  Administered 2021-08-16 – 2021-08-17 (×4): 4 mg via INTRAVENOUS
  Filled 2021-08-15 (×4): qty 2

## 2021-08-15 MED ORDER — SOD CITRATE-CITRIC ACID 500-334 MG/5ML PO SOLN
30.0000 mL | ORAL | Status: DC | PRN
Start: 2021-08-15 — End: 2021-08-17
  Administered 2021-08-16: 30 mL via ORAL
  Filled 2021-08-15: qty 30

## 2021-08-15 MED ORDER — SODIUM CHLORIDE 0.9 % IV SOLN
5.0000 10*6.[IU] | Freq: Once | INTRAVENOUS | Status: AC
  Administered 2021-08-15: 5 10*6.[IU] via INTRAVENOUS
  Filled 2021-08-15: qty 5

## 2021-08-15 MED ORDER — OXYTOCIN BOLUS FROM INFUSION
333.0000 mL | Freq: Once | INTRAVENOUS | Status: AC
  Administered 2021-08-17: 333 mL via INTRAVENOUS

## 2021-08-15 MED ORDER — LACTATED RINGERS IV SOLN
500.0000 mL | INTRAVENOUS | Status: DC | PRN
  Administered 2021-08-16: 1000 mL via INTRAVENOUS
  Administered 2021-08-16 – 2021-08-17 (×3): 500 mL via INTRAVENOUS

## 2021-08-15 MED ORDER — PENICILLIN G POT IN DEXTROSE 60000 UNIT/ML IV SOLN
3.0000 10*6.[IU] | INTRAVENOUS | Status: DC
  Administered 2021-08-16 – 2021-08-17 (×8): 3 10*6.[IU] via INTRAVENOUS
  Filled 2021-08-15 (×8): qty 50

## 2021-08-15 MED ORDER — OXYCODONE-ACETAMINOPHEN 5-325 MG PO TABS: 1.0000 | ORAL_TABLET | ORAL | Status: DC | PRN

## 2021-08-15 MED ORDER — FENTANYL CITRATE (PF) 100 MCG/2ML IJ SOLN
50.0000 ug | INTRAMUSCULAR | Status: DC | PRN
  Administered 2021-08-16 (×2): 100 ug via INTRAVENOUS
  Filled 2021-08-15 (×2): qty 2

## 2021-08-15 MED ORDER — ACETAMINOPHEN 325 MG PO TABS: 650.0000 mg | ORAL_TABLET | ORAL | Status: DC | PRN

## 2021-08-15 MED ORDER — LACTATED RINGERS IV SOLN: INTRAVENOUS | Status: DC

## 2021-08-15 MED ORDER — OXYTOCIN-SODIUM CHLORIDE 30-0.9 UT/500ML-% IV SOLN
2.5000 [IU]/h | INTRAVENOUS | Status: DC
  Administered 2021-08-17: 2.5 [IU]/h via INTRAVENOUS

## 2021-08-15 MED ORDER — LIDOCAINE HCL (PF) 1 % IJ SOLN: 30.0000 mL | INTRAMUSCULAR | Status: DC | PRN

## 2021-08-15 NOTE — [facility]U Provider Note (Signed)
Pt informed that the ultrasound is considered a limited OB ultrasound and is not intended to be a complete ultrasound exam.  Patient also informed that the ultrasound is not being completed with the intent of assessing for fetal or placental anomalies or any pelvic abnormalities.  Explained that the purpose of today's ultrasound is to assess for  presentation (VTX).  Patient acknowledges the purpose of the exam and the limitations of the study.    Clarisa Fling, NP  10:31 PM 08/15/2021

## 2021-08-15 NOTE — MAU Note (Signed)
Amanda Church is a 36 y.o. at [redacted]w[redacted]d here in MAU reporting: ctx that woke her up out of her sleep around 1630 and were about every 20 minutes. States she got up and took a shower, only felt baby move once while in the shower. Pt states she called her provider and they told her to come in for the DFM. States baby did start moving more on her way to the hospital. Pt reports on her way here her ctx got closer together and are about 5 minutes apart. Denies any VB. States she felt some vaginal discharge come out while while she was walking but doesn't believe she leaking any fluid. States last SVE in the office she was "barely a 1".   Onset of complaint: 1630 Pain score: 7 Vitals:   08/15/21 2203  BP: 135/70  Pulse: 86  Resp: 18  Temp: 98.2 F (36.8 C)  SpO2: 100%    FHT:139 Lab orders placed from triage: u/a

## 2021-08-15 NOTE — MAU Note (Signed)
Verified vertex presentation via bedside ultrasound with Vernice Jefferson, NP.
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
## 2021-08-16 ENCOUNTER — Encounter: Payer: Medicaid Other | Admitting: Obstetrics & Gynecology

## 2021-08-16 ENCOUNTER — Encounter (HOSPITAL_COMMUNITY): Payer: Self-pay | Admitting: Obstetrics & Gynecology

## 2021-08-16 ENCOUNTER — Inpatient Hospital Stay (HOSPITAL_COMMUNITY): Payer: Medicaid Other | Admitting: Anesthesiology

## 2021-08-16 ENCOUNTER — Encounter (HOSPITAL_COMMUNITY)
Admission: RE | Admit: 2021-08-16 | Discharge: 2021-08-16 | Disposition: A | Discharge status: Home / Self Care | Payer: Medicaid Other | Source: Ambulatory Visit | Attending: Nurse Practitioner | Admitting: Nurse Practitioner

## 2021-08-16 HISTORY — DX: Gestational diabetes mellitus in pregnancy, unspecified control: O24.419

## 2021-08-16 HISTORY — DX: Enthesopathy, unspecified: M77.9

## 2021-08-16 LAB — COMPREHENSIVE METABOLIC PANEL
ALT: 11 U/L (ref 0–44)
AST: 22 U/L (ref 15–41)
Albumin: 2.7 g/dL — ABNORMAL LOW (ref 3.5–5.0)
Alkaline Phosphatase: 140 U/L — ABNORMAL HIGH (ref 38–126)
Anion gap: 8 (ref 5–15)
BUN: <5 mg/dL — ABNORMAL LOW (ref 6–20)
CO2: 22 mmol/L (ref 22–32)
Calcium: 8.3 mg/dL — ABNORMAL LOW (ref 8.9–10.3)
Chloride: 106 mmol/L (ref 98–111)
Creatinine, Ser: 0.47 mg/dL (ref 0.44–1.00)
GFR, Estimated: >60 mL/min (ref >60)
Glucose, Bld: 98 mg/dL (ref 70–99)
Potassium: 4.1 mmol/L (ref 3.5–5.1)
Sodium: 136 mmol/L (ref 135–145)
Total Bilirubin: 0.3 mg/dL (ref 0.3–1.2)
Total Protein: 5.6 g/dL — ABNORMAL LOW (ref 6.5–8.1)

## 2021-08-16 LAB — CBC
HCT: 30.7 % — ABNORMAL LOW (ref 36.0–46.0)
Hemoglobin: 9.4 g/dL — ABNORMAL LOW (ref 12.0–15.0)
MCH: 23.3 pg — ABNORMAL LOW (ref 26.0–34.0)
MCHC: 30.6 g/dL (ref 30.0–36.0)
MCV: 76 fL — ABNORMAL LOW (ref 80.0–100.0)
Platelets: 234 10*3/uL (ref 150–400)
RBC: 4.04 MIL/uL (ref 3.87–5.11)
RDW: 21.1 % — ABNORMAL HIGH (ref 11.5–15.5)
WBC: 6.2 10*3/uL (ref 4.0–10.5)
nRBC: 0 % (ref 0.0–0.2)

## 2021-08-16 LAB — GLUCOSE, CAPILLARY
Glucose-Capillary: 86 mg/dL (ref 70–99)
Glucose-Capillary: 87 mg/dL (ref 70–99)
Glucose-Capillary: 82 mg/dL (ref 70–99)
Glucose-Capillary: 104 mg/dL — ABNORMAL HIGH (ref 70–99)
Glucose-Capillary: 104 mg/dL — ABNORMAL HIGH (ref 70–99)
Glucose-Capillary: 103 mg/dL — ABNORMAL HIGH (ref 70–99)

## 2021-08-16 LAB — RPR: RPR Ser Ql: NONREACTIVE

## 2021-08-16 LAB — OB RESULTS CONSOLE GBS: GBS: POSITIVE

## 2021-08-16 MED ORDER — PHENYLEPHRINE 80 MCG/ML (10ML) SYRINGE FOR IV PUSH (FOR BLOOD PRESSURE SUPPORT)
80.0000 ug | PREFILLED_SYRINGE | INTRAVENOUS | Status: DC | PRN
Start: 2021-08-16 — End: 2021-08-17

## 2021-08-16 MED ORDER — TERBUTALINE SULFATE 1 MG/ML IJ SOLN: 0.2500 mg | Freq: Once | INTRAMUSCULAR | Status: DC | PRN

## 2021-08-16 MED ORDER — LACTATED RINGERS IV SOLN
500.0000 mL | Freq: Once | INTRAVENOUS | Status: AC
  Administered 2021-08-16: 500 mL via INTRAVENOUS

## 2021-08-16 MED ORDER — FENTANYL-BUPIVACAINE-NACL 0.5-0.125-0.9 MG/250ML-% EP SOLN
12.0000 mL/h | EPIDURAL | Status: DC | PRN
  Administered 2021-08-16 – 2021-08-17 (×2): 12 mL/h via EPIDURAL
  Filled 2021-08-16 (×2): qty 250

## 2021-08-16 MED ORDER — EPHEDRINE 5 MG/ML INJ: 10.0000 mg | INTRAVENOUS | Status: DC | PRN

## 2021-08-16 MED ORDER — DIPHENHYDRAMINE HCL 50 MG/ML IJ SOLN: 12.5000 mg | INTRAMUSCULAR | Status: DC | PRN

## 2021-08-16 MED ORDER — OXYTOCIN-SODIUM CHLORIDE 30-0.9 UT/500ML-% IV SOLN
1.0000 m[IU]/min | INTRAVENOUS | Status: DC
  Administered 2021-08-16: 1 m[IU]/min via INTRAVENOUS
  Filled 2021-08-16: qty 500

## 2021-08-16 MED ORDER — LIDOCAINE-EPINEPHRINE (PF) 1.5 %-1:200000 IJ SOLN
INTRAMUSCULAR | Status: DC | PRN
  Administered 2021-08-16: 5 mL via EPIDURAL

## 2021-08-16 MED ORDER — PHENYLEPHRINE 80 MCG/ML (10ML) SYRINGE FOR IV PUSH (FOR BLOOD PRESSURE SUPPORT)
80.0000 ug | PREFILLED_SYRINGE | INTRAVENOUS | Status: DC | PRN
  Filled 2021-08-16: qty 10

## 2021-08-16 NOTE — Progress Notes (Signed)
Labor Progress Note Amanda Church is a 36 y.o. [id number] at [redacted]w[redacted]d presented for TOLAC (C/S x2)/DFM.   S: Feeling contractions picking back up, uncomfortable and breathing through them.   O:  BP (!) 147/86 (BP Location: Left Arm)   Pulse 78   Temp (!) 97.5 F (36.4 C) (Oral)   Resp 18   LMP 11/08/2020 (Approximate)   SpO2 100%  EFM: 140/mod/15x15/none  CVE: Dilation: 2 Effacement (%): Thick Cervical Position: Posterior Station: -3 Presentation: Vertex Exam by:: Dr. Annia Friendly   A&P: 36 y.o. [id number] [redacted]w[redacted]d  #Labor: Suspect some change since last check (checked in MAU previously). Discussed expectant management vs FB-- through shared decision making opted for expectant management over the next 2-3 and if unchanged plan for FB then.  #Pain: PRN  #FWB: Cat I  #GBS positive   Allayne Stack, DO 3:06 AM

## 2021-08-16 NOTE — Anesthesia Procedure Notes (Signed)
Epidural Patient location during procedure: OB Start time: 08/16/2021 4:05 PM End time: 08/16/2021 4:25 PM  Staffing Anesthesiologist: Atilano Median, DO Performed: anesthesiologist   Preanesthetic Checklist Completed: patient identified, IV checked, site marked, risks and benefits discussed, surgical consent, monitors and equipment checked, pre-op evaluation and timeout performed  Epidural Patient position: sitting Prep: ChloraPrep Patient monitoring: heart rate, continuous pulse ox and blood pressure Approach: midline Location: L3-L4 Injection technique: LOR saline  Needle:  Needle type: Tuohy  Needle gauge: 17 G Needle length: 9 cm Needle insertion depth: 8 cm Catheter type: closed end flexible Catheter size: 20 Guage Catheter at skin depth: 13 cm Test dose: negative and 1.5% lidocaine with Epi 1:200 K  Assessment Events: blood not aspirated, injection not painful, no injection resistance and no paresthesia  Additional Notes  Patient identified. Risks/Benefits/Options discussed with patient including but not limited to bleeding, infection, nerve damage, paralysis, failed block, incomplete pain control, headache, blood pressure changes, nausea, vomiting, reactions to medications, itching and postpartum back pain. Confirmed with bedside nurse the patient's most recent platelet count. Confirmed with patient that they are not currently taking any anticoagulation, have any bleeding history or any family history of bleeding disorders. Patient expressed understanding and wished to proceed. All questions were answered. Sterile technique was used throughout the entire procedure. Please see nursing notes for vital signs. Test dose was given through epidural catheter and negative prior to continuing to dose epidural or start infusion. Warning signs of high block given to the patient including shortness of breath, tingling/numbness in hands, complete motor block, or any concerning  symptoms with instructions to call for help. Patient was given instructions on fall risk and not to get out of bed. All questions and concerns addressed with instructions to call with any issues or inadequate analgesia.    Reason for block:procedure for pain

## 2021-08-16 NOTE — Progress Notes (Signed)
Labor Progress Note Amanda Church is a 36 y.o. [id number] at [redacted]w[redacted]d presented for TOLAC/ early labor/DFM S:  Reports feels ok. Feeling contractions some. Has not moved out of bed much. Open to AROM once appropriate.  O:  BP 125/66   Pulse 79   Temp (!) 97.5 F (36.4 C) (Axillary)   Resp 18   Ht 5\' 1"  (1.549 m)   Wt 117.5 kg   LMP 11/08/2020 (Approximate)   SpO2 98%   BMI 48.94 kg/m  EFM: 130/moderate/+accels/no decels  CVE: Dilation: 2 Effacement (%): 50 Cervical Position: Posterior Station: -4 Presentation: Vertex   A&P: 36 y.o. 31 at [redacted]w[redacted]d presented for TOLAC/ early labor/DFM #Labor: Now s/p foley balloon. Patient open to check and AROM when team available. At this time due to floor acuity and about to start a different cesarean section will hold off on check and AROM. Discussed with patient regarding moving around or sitting on ball during that time to help with fetal head descent.. Will then assess for AROM after out of OR.  #Pain: IV pain meds, considering epidural #FWB: Cat I #GBS positive> PCN  #FBG Within goal (in 80s-low 100s)  [redacted]w[redacted]d, MD, MPH OB Fellow, Faculty Practice

## 2021-08-16 NOTE — Progress Notes (Signed)
Labor Progress Note Amanda Church is a 36 y.o. [id number] at [redacted]w[redacted]d presented for DFM/TOLAC (C/Sx2)   S: Still uncomfortable with contractions, but spacing again.   O:  BP (!) 144/96   Pulse 84   Temp 97.6 F (36.4 C) (Oral)   Resp 18   LMP 11/08/2020 (Approximate)   SpO2 98%  EFM: 120/mod/15x15/none  CVE: Dilation: 2 Effacement (%): 50 Cervical Position: Posterior Station: -3 Presentation: Vertex Exam by:: Dr. Annia Friendly   A&P: 36 y.o. [id number] [redacted]w[redacted]d  #Labor: Largely unchanged since last check, but has thinned out a little more. After verbal consent, placed a FB without difficulty. Patient tolerated well.  #Pain: PRN, given IV fent after FB placement  #FWB: Cat I  #GBS positive  #GDM: CBGs appropriate.   Allayne Stack, DO 8:05 AM
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
## 2021-08-16 NOTE — Progress Notes (Signed)
Labor Progress Note Amanda Church is a 36 y.o. E7O3500 at [redacted]w[redacted]d presented for TOLAC/early labor with two prior cesarean sections and 4 prior vaginal deliveries.  S:  Doing well. Feels comfortable after epidural  O:  BP (!) 112/58   Pulse 96   Temp (!) 97.5 F (36.4 C) (Axillary)   Resp 18   Ht 5\' 1"  (1.549 m)   Wt 117.5 kg   LMP 11/08/2020 (Approximate)   SpO2 100%   BMI 48.94 kg/m  EFM: 120s/moderate/+accels/no decels  CVE: Dilation: 4 Effacement (%): 50 Cervical Position: Posterior Station: -3 to -4 Presentation: Vertex Exam by:: dr [email or web address]   A&P: 36 y.o. 31 at [redacted]w[redacted]d presented for TOLAC/early labor with two prior cesarean sections and 4 prior vaginal deliveries. #Labor:  cervix remains unchanged and fetal head high in pelvis. Head better engaged with fundal pressure. AROM performed successfully with FSE hook with moderate amount of clear fluid returned. IUPC placed for contraction monitoring. Will assess contraction pattern now that AROMed. Given two prior CS will hold off on pitocin for now. If continues to be unchanged and not much contractions hours after AROM could consider pitocin after thorough risks/benefits discussion #Pain: epidural #FWB: Cat I #GBS positive>PCN  [redacted]w[redacted]d, MD 6:23 PM

## 2021-08-16 NOTE — Progress Notes (Signed)
Labor Progress Note Amanda Church is a 36 y.o. [id number] at [redacted]w[redacted]d presented for TOLAC/ early labor/DFM (two prior cesarean sections)  S:  Patient reports feeling some contractions. Ok with check and AROM at this time  O:  BP 125/66   Pulse 79   Temp (!) 97.5 F (36.4 C) (Axillary)   Resp 18   Ht 5\' 1"  (1.549 m)   Wt 117.5 kg   LMP 11/08/2020 (Approximate)   SpO2 98%   BMI 48.94 kg/m  EFM: 130/accels/no decels  CVE: Dilation: 4 Effacement (%): 50 Cervical Position: Posterior Station: -4 Presentation: Vertex Exam by:: Dr [email or web address]   A&P: 36 y.o. 31 at [redacted]w[redacted]d presented for TOLAC/ early labor/DFM (two prior cesarean sections) #Labor: Cervix checked and 4/50/-4. Head applied when fundal pressure applied. Very posterior and difficult to reach and patient very uncomfortable. Tried with dose of IV fentanyl and still very uncomfortable. Also tried AROM with FSE hook (in place of amnihook to allow for slower leak given high in pelvis) however patient to uncomfortable to continue procedure. Patient desires epidural at this time prior to proceeding. Plan for epidural placement and  then reattempt AROM   10-18-1995, MD 4:40 PM

## 2021-08-16 NOTE — Anesthesia Preprocedure Evaluation (Signed)
Anesthesia Evaluation  Patient identified by MRN, date of birth, ID band Patient awake    Reviewed: Allergy & Precautions, NPO status , Patient's Chart, lab work & pertinent test results  Airway Mallampati: II  TM Distance: >3 FB Neck ROM: Full    Dental no notable dental hx.    Pulmonary neg pulmonary ROS, Current Smoker,    Pulmonary exam normal        Cardiovascular negative cardio ROS   Rhythm:Regular Rate:Normal     Neuro/Psych  Headaches, Anxiety Depression Bipolar Disorder    GI/Hepatic negative GI ROS, Neg liver ROS,   Endo/Other  diabetesMorbid obesity  Renal/GU negative Renal ROS  negative genitourinary   Musculoskeletal   Abdominal Normal abdominal exam  (+)   Peds  Hematology  (+) Blood dyscrasia, anemia ,   Anesthesia Other Findings   Reproductive/Obstetrics (+) Pregnancy                             Anesthesia Physical Anesthesia Plan  ASA: 3  Anesthesia Plan: Epidural   Post-op Pain Management:    Induction:   PONV Risk Score and Plan: 1 and Treatment may vary due to age or medical condition  Airway Management Planned: Natural Airway  Additional Equipment: None  Intra-op Plan:   Post-operative Plan:   Informed Consent: I have reviewed the patients History and Physical, chart, labs and discussed the procedure including the risks, benefits and alternatives for the proposed anesthesia with the patient or authorized representative who has indicated his/her understanding and acceptance.     Dental advisory given  Plan Discussed with:   Anesthesia Plan Comments: (Lab Results      Component                Value               Date                      WBC                      6.2                 08/16/2021                HGB                      9.4 (L)             08/16/2021                HCT                      30.7 (L)            08/16/2021                 MCV                      76.0 (L)            08/16/2021                PLT                      234                 08/16/2021          )  Anesthesia Quick Evaluation  

## 2021-08-16 NOTE — Progress Notes (Signed)
Patient ID: Amanda Church, female   DOB: 09-23-1985, 36 y.o.   MRN: [id number] Reviewed progress with Dr Shawnie Pons She recommends starting Pitocin per very low dose protocol (1x1)  Vitals:   08/16/21 1946 08/16/21 2001 08/16/21 2032 08/16/21 2101  BP: (!) 104/44 (!) 106/50 (!) 98/48 (!) 104/53  Pulse: 81 75 77 89  Resp: 18 16 18    Temp:  (!) 97.2 F (36.2 C)    TempSrc:  Axillary    SpO2:      Weight:      Height:       FHR reactive  UCs only every 20-61minutes  Dilation: 3.5 Effacement (%): 60, 70 Cervical Position: Posterior Station: 32m, -3 Presentation: Vertex Exam by:: [email or web address] CNM  Discussed risks and benefits of Pitocin and method for increasing dose Patient agreeable to start   WIll start 76mu/min and increase 1X!

## 2021-08-17 ENCOUNTER — Encounter (HOSPITAL_COMMUNITY): Payer: Self-pay | Admitting: Obstetrics & Gynecology

## 2021-08-17 ENCOUNTER — Ambulatory Visit: Payer: Medicaid Other

## 2021-08-17 DIAGNOSIS — O99824 Streptococcus B carrier state complicating childbirth: Secondary | ICD-10-CM

## 2021-08-17 DIAGNOSIS — Z3A39 39 weeks gestation of pregnancy: Secondary | ICD-10-CM

## 2021-08-17 DIAGNOSIS — O24420 Gestational diabetes mellitus in childbirth, diet controlled: Secondary | ICD-10-CM

## 2021-08-17 DIAGNOSIS — O34211 Maternal care for low transverse scar from previous cesarean delivery: Secondary | ICD-10-CM

## 2021-08-17 LAB — GLUCOSE, CAPILLARY
Glucose-Capillary: 127 mg/dL — ABNORMAL HIGH (ref 70–99)
Glucose-Capillary: 98 mg/dL (ref 70–99)
Glucose-Capillary: 78 mg/dL (ref 70–99)

## 2021-08-17 MED ORDER — MEASLES, MUMPS & RUBELLA VAC IJ SOLR: 0.5000 mL | Freq: Once | INTRAMUSCULAR | Status: DC

## 2021-08-17 MED ORDER — PRENATAL MULTIVITAMIN CH
1.0000 | ORAL_TABLET | Freq: Every day | ORAL | Status: DC
  Administered 2021-08-18 – 2021-08-19 (×2): 1 via ORAL
  Filled 2021-08-17 (×2): qty 1

## 2021-08-17 MED ORDER — TRANEXAMIC ACID-NACL 1000-0.7 MG/100ML-% IV SOLN: 1000.0000 mg | INTRAVENOUS | Status: AC

## 2021-08-17 MED ORDER — DIPHENHYDRAMINE HCL 25 MG PO CAPS: 25.0000 mg | ORAL_CAPSULE | Freq: Four times a day (QID) | ORAL | Status: DC | PRN

## 2021-08-17 MED ORDER — DIBUCAINE (PERIANAL) 1 % EX OINT: 1.0000 "application " | TOPICAL_OINTMENT | CUTANEOUS | Status: DC | PRN

## 2021-08-17 MED ORDER — ACETAMINOPHEN 325 MG PO TABS
650.0000 mg | ORAL_TABLET | ORAL | Status: DC | PRN
  Administered 2021-08-18 – 2021-08-19 (×4): 650 mg via ORAL
  Filled 2021-08-17 (×4): qty 2

## 2021-08-17 MED ORDER — SODIUM CHLORIDE 0.9% FLUSH: 3.0000 mL | INTRAVENOUS | Status: DC | PRN

## 2021-08-17 MED ORDER — BENZOCAINE-MENTHOL 20-0.5 % EX AERO
1.0000 "application " | INHALATION_SPRAY | CUTANEOUS | Status: DC | PRN
  Administered 2021-08-17: 1 via TOPICAL
  Filled 2021-08-17: qty 56

## 2021-08-17 MED ORDER — TRANEXAMIC ACID-NACL 1000-0.7 MG/100ML-% IV SOLN
INTRAVENOUS | Status: AC
  Administered 2021-08-17: 1000 mg via INTRAVENOUS
  Filled 2021-08-17: qty 100

## 2021-08-17 MED ORDER — ONDANSETRON HCL 4 MG PO TABS: 4.0000 mg | ORAL_TABLET | ORAL | Status: DC | PRN

## 2021-08-17 MED ORDER — ONDANSETRON HCL 4 MG/2ML IJ SOLN: 4.0000 mg | INTRAMUSCULAR | Status: DC | PRN

## 2021-08-17 MED ORDER — IBUPROFEN 600 MG PO TABS
600.0000 mg | ORAL_TABLET | Freq: Four times a day (QID) | ORAL | Status: DC
  Administered 2021-08-17 – 2021-08-19 (×8): 600 mg via ORAL
  Filled 2021-08-17 (×8): qty 1

## 2021-08-17 MED ORDER — SIMETHICONE 80 MG PO CHEW: 80.0000 mg | CHEWABLE_TABLET | ORAL | Status: DC | PRN

## 2021-08-17 MED ORDER — ZOLPIDEM TARTRATE 5 MG PO TABS: 5.0000 mg | ORAL_TABLET | Freq: Every evening | ORAL | Status: DC | PRN

## 2021-08-17 MED ORDER — WITCH HAZEL-GLYCERIN EX PADS
1.0000 | MEDICATED_PAD | CUTANEOUS | Status: DC | PRN
Start: 2021-08-17 — End: 2021-08-19

## 2021-08-17 MED ORDER — SODIUM CHLORIDE 0.9 % IV SOLN: INTRAVENOUS | Status: DC | PRN

## 2021-08-17 MED ORDER — TETANUS-DIPHTH-ACELL PERTUSSIS 5-2.5-18.5 LF-MCG/0.5 IM SUSY: 0.5000 mL | PREFILLED_SYRINGE | Freq: Once | INTRAMUSCULAR | Status: DC

## 2021-08-17 MED ORDER — SENNOSIDES-DOCUSATE SODIUM 8.6-50 MG PO TABS
2.0000 | ORAL_TABLET | ORAL | Status: DC
  Administered 2021-08-18 – 2021-08-19 (×2): 2 via ORAL
  Filled 2021-08-17 (×2): qty 2

## 2021-08-17 MED ORDER — COCONUT OIL OIL
1.0000 | TOPICAL_OIL | Status: DC | PRN
Start: 2021-08-17 — End: 2021-08-19

## 2021-08-17 MED ORDER — SODIUM CHLORIDE 0.9% FLUSH: 3.0000 mL | Freq: Two times a day (BID) | INTRAVENOUS | Status: DC

## 2021-08-17 NOTE — Progress Notes (Signed)
Patient ID: Amanda Church, female   DOB: 1985-04-23, 35 y.o.   MRN: [id number] Comfortable with epidural  Vitals:   08/16/21 2331 08/17/21 0001 08/17/21 0033 08/17/21 0101  BP: 139/70 (!) 108/49 (!) 102/48 (!) 112/47  Pulse: 79 71 77 86  Resp:  18 16 18   Temp:      TempSrc:      SpO2:      Weight:      Height:       FHR reactive UCs every 2.30min  Dilation: 4 Effacement (%): 60, 70 Cervical Position: Posterior Station: Ballotable Presentation: Vertex Exam by:: [email or web address], CNM Cervix is still somewhat posterior and thick but soft  Will continue to observe

## 2021-08-17 NOTE — Progress Notes (Signed)
Patient ID: [name], female   DOB: [birth year], 36 y.o.   MRN: [id number]  Labor Progress Note Amanda Church is a 36 y.o. G7P6006 at [redacted]w[redacted]d presented for early labor with decreased fetal movement  S:  Pt resting well, repositioning often with epidural in place. FOB at bedside for support.  O:  BP 133/67   Pulse 76   Temp 97.7 F (36.5 C) (Oral)   Resp 18   Ht 5\' 1"  (1.549 m)   Wt 259 lb (117.5 kg)   LMP 11/08/2020 (Approximate)   SpO2 95%   BMI 48.94 kg/m  EFM: baseline 125 bpm/ moderate variability/ 15x15 accels/ no decels  Toco/IUPC: q2-21min SVE: Dilation: 5.5 Effacement (%): 70 Cervical Position: Posterior Station: -2, -1 Presentation: Vertex Exam by:: Keith Rake RN Pitocin: 3 mu/min  A/P: 36 y.o. [id number] [redacted]w[redacted]d  1. Labor: active 2. FWB: cat 1, has improved from prior late decels 3. Pain: well-controlled with epidural and pt able to move well 4. GDM: glucose stable, last reading: 78 5. Hx CSx2: no s/sx of rupture  Continue to titrate pitocin depending on fetal tolerance. Anticipate SVD.  Gabriel Carina, CNM 10:56 AM

## 2021-08-17 NOTE — Discharge Summary (Signed)
Postpartum Discharge Summary  Date of Service updated-6/11     Patient Name: Amanda Church DOB: 1986/02/02 MRN: [id number]  Date of admission: 08/15/2021 Delivery date:08/17/2021  Delivering provider: Gaylan Gerold R  Date of discharge: 08/19/2021  Admitting diagnosis: Indication for care in labor and delivery, antepartum [O75.9] Intrauterine pregnancy: [redacted]w[redacted]d    Secondary diagnosis:  Principal Problem:   Indication for care in labor and delivery, antepartum Active Problems:   Iron deficiency anemia   HGSIL (high grade squamous intraepithelial lesion) on Pap smear of cervix   History of 2 cesarean sections   Alpha thalassemia silent carrier   Unwanted fertility   Gestational diabetes mellitus (GDM) affecting pregnancy  Additional problems: None    Discharge diagnosis: Term Pregnancy Delivered, VBAC, and GDM A1                                              Post partum procedures: none Augmentation: AROM, Pitocin, and IP Foley Complications: None  Hospital course: Onset of Labor With Vaginal Delivery      36y.o. yo [id number] admitted in Latent Labor with DFM on 08/15/2021. Patient had an uncomplicated labor course as follows:  Membrane Rupture Time/Date: 6:13 PM ,08/16/2021   Delivery Method:VBAC, Spontaneous  Episiotomy: None  Lacerations:  None  Patient had an uncomplicated postpartum course.  She is ambulating, tolerating a regular diet, passing flatus, and urinating well. Patient is discharged home in stable condition on 08/19/21.  Newborn Data: Birth date:08/17/2021  Birth time:2:08 PM  Gender:Female  Living status:Living  Apgars:7 ,9  Weight:3460 g   Magnesium Sulfate received: No BMZ received: No Rhophylac:No MMR:No T-DaP:Given prenatally Flu: No Transfusion:No  Physical exam  Vitals:   08/18/21 0541 08/18/21 1455 08/18/21 2215 08/19/21 0452  BP: (!) 120/57 122/73 134/77 135/69  Pulse: 82 79 85 83  Resp: '18 18 17 17  ' Temp: 98.2 F (36.8 C)  98.8 F (37.1 C) 98.4 F (36.9 C) 97.8 F (36.6 C)  TempSrc: Oral Oral Oral Oral  SpO2: 100%  100% 98%  Weight:      Height:       General: alert, cooperative, and no distress Lochia: appropriate Uterine Fundus: firm Incision: N/A DVT Evaluation: No evidence of DVT seen on physical exam. Labs: Lab Results  Component Value Date   WBC 8.7 08/18/2021   HGB 7.9 (L) 08/18/2021   HCT 26.3 (L) 08/18/2021   MCV 77.4 (L) 08/18/2021   PLT 206 08/18/2021      Latest Ref Rng & Units 08/16/2021    3:02 PM  CMP  Glucose 70 - 99 mg/dL 98   BUN 6 - 20 mg/dL <5   Creatinine 0.44 - 1.00 mg/dL 0.47   Sodium 135 - 145 mmol/L 136   Potassium 3.5 - 5.1 mmol/L 4.1   Chloride 98 - 111 mmol/L 106   CO2 22 - 32 mmol/L 22   Calcium 8.9 - 10.3 mg/dL 8.3   Total Protein 6.5 - 8.1 g/dL 5.6   Total Bilirubin 0.3 - 1.2 mg/dL 0.3   Alkaline Phos 38 - 126 U/L 140   AST 15 - 41 U/L 22   ALT 0 - 44 U/L 11    Edinburgh Score:     No data to display           After  visit meds:  Allergies as of 08/19/2021       Reactions   Shellfish Allergy Shortness Of Breath, Swelling, Other (See Comments)   Numbness/tingling.  Swelling of mouth/tounge, moving towards throat. Took Benadryl 50 mg before swelling affected breathing.   Latex Itching, Rash        Medication List     STOP taking these medications    Accu-Chek Guide w/Device Kit   Accu-Chek Softclix Lancets lancets   aspirin 81 MG chewable tablet   glucose blood test strip       TAKE these medications    acetaminophen 325 MG tablet Commonly known as: Tylenol Take 2 tablets (650 mg total) by mouth every 4 (four) hours as needed for up to 15 days (for pain scale < 4). What changed:  medication strength how much to take when to take this reasons to take this   cetirizine 10 MG tablet Commonly known as: ZYRTEC Take 10 mg by mouth at bedtime.   fluticasone 50 MCG/ACT nasal spray Commonly known as: FLONASE Place 2 sprays  into both nostrils daily.   ibuprofen 600 MG tablet Commonly known as: ADVIL Take 1 tablet (600 mg total) by mouth every 6 (six) hours as needed.   montelukast 10 MG tablet Commonly known as: SINGULAIR Take 10 mg by mouth daily.   norethindrone 0.35 MG tablet Commonly known as: Ortho Micronor Take 1 tablet (0.35 mg total) by mouth daily.   Prenatal Vitamin 27-0.8 MG Tabs Take 1 tablet by mouth daily.         Discharge home in stable condition Infant Feeding: Breast Infant Disposition:home with mother Discharge instruction: per After Visit Summary and Postpartum booklet. Activity: Advance as tolerated. Pelvic rest for 6 weeks.  Diet: routine diet Future Appointments:No future appointments. Follow up Visit:  Message sent to [facility] by Dr Higinio Plan:  Please schedule this patient for a In person postpartum visit in 6 weeks with the following provider: Any provider. Additional Postpartum F/U: 2 week appointment with an Ob/gyn MD to discuss interval BTL, 2 hour GTT, and colpo at postpartum  High risk pregnancy complicated by: GDM and 2 previous C/S Delivery mode:  VBAC, Spontaneous  Anticipated Birth Control:  Plans Interval BTL   08/19/2021 Annalee Genta, DO

## 2021-08-17 NOTE — Lactation Note (Signed)
This note was copied from a baby's chart. Lactation Consultation Note  Patient Name: Amanda Church Date: 08/17/2021 Reason for consult: Initial assessment;Term Age:35 hours Mom requested LC assistance tonight. Mom latched infant on her right breast using the football hold position, infant latched with depth, swallows heard, infant BF for 13 minutes and appeared content. Mom was doing skin to skin with infant when [facility] left the room. Mom will attempt to latch infant on both breast during a feeding if infant is still cueing. Mom will continue to breastfeed infant according to hunger cues, on demand, 8 to 12+ times within 24 hours, skin to skin. Mom knows to ask RN/ LC for further latch assistance if needed. Mom made aware of O/P services, breastfeeding support groups, community resources, and our phone # for post-discharge questions.   Maternal Data Has patient been taught Hand Expression?: Yes Does the patient have breastfeeding experience prior to this delivery?: Yes How long did the patient breastfeed?: Per mom ,she BF all 6 of her  children but she BF 1st child the longest 10 months and 6 th child for 6 months who is currently  90 years old,  Feeding Mother's Current Feeding Choice: Breast Milk  LATCH Score Latch: Grasps breast easily, tongue down, lips flanged, rhythmical sucking.  Audible Swallowing: A few with stimulation  Type of Nipple: Everted at rest and after stimulation  Comfort (Breast/Nipple): Soft / non-tender  Hold (Positioning): Assistance needed to correctly position infant at breast and maintain latch.  LATCH Score: 8   Lactation Tools Discussed/Used Tools: Pump Breast pump type: Manual Pump Education: Setup, frequency, and cleaning;Milk Storage Reason for Pumping: prn home use  Interventions Interventions: Breast feeding basics reviewed;Assisted with latch;Skin to skin;Breast compression;Adjust position;Support pillows;Position options;Expressed  milk;Education;LC Services brochure;Hand pump  Discharge WIC Program: Yes  Consult Status Consult Status: Follow-up Date: 08/18/21 Follow-up type: In-patient    Amanda Church 08/17/2021, 11:25 PM

## 2021-08-17 NOTE — Progress Notes (Signed)
Patient ID: Amanda Church, female   DOB: [birth year], 36 y.o.   MRN: [id number] RN called to report FHR pattern with several repetetive late decelerations Variability remains good There are accelerations  Position changed Will stop Pitocin for 30 minutes to rest  Vitals:   08/17/21 0501 08/17/21 0601 08/17/21 0641 08/17/21 0702  BP: (!) 113/56 113/65 (!) 93/41 (!) 96/42  Pulse: 76 81 72 85  Resp:  15    Temp:  98 F (36.7 C)    TempSrc:  Oral    SpO2:      Weight:      Height:       Dilation: 5.5 Effacement (%): 70 Cervical Position: Posterior Station: -2, -1 Presentation: Vertex Exam by:: Valarie Merino RN

## 2021-08-17 NOTE — Progress Notes (Signed)
Patient ID: Amanda Church, female   DOB: Mar 23, 1985, 36 y.o.   MRN: [id number]  Labor Progress Note [name] is a 36 y.o. G7P6006 at [redacted]w[redacted]d presented for early labor with decreased fetal movement  S:  Pt resting well, repositioning often with epidural in place. FOB at bedside for support.  O:  BP 129/69 (BP Location: Right Arm)   Pulse 80   Temp 98.2 F (36.8 C) (Oral)   Resp 18   Ht 5\' 1"  (1.549 m)   Wt 259 lb (117.5 kg)   LMP 11/08/2020 (Approximate)   SpO2 99%   Breastfeeding Unknown   BMI 48.94 kg/m  EFM: baseline 125 bpm/ moderate variability/ 15x15 accels/ no decels  Toco/IUPC: q2-20min SVE: 9.5/90/0 Pitocin: 3 mu/min  A/P: 36 y.o. [id number] [redacted]w[redacted]d  1. Labor: transitioning to complete 2. FWB: cat 1, has improved from prior late decels 3. Pain: well-controlled with epidural and pt able to move well 4. GDM: glucose stable 5. Hx CSx2: no s/sx of rupture  Anterior lip present, pt respositioned into modified Walcher's to help reduce lip. Anticipate SVD.  Gaylan Gerold, CNM, MSN, [place] Certified Nurse Midwife, [facility]

## 2021-08-17 NOTE — Lactation Note (Addendum)
This note was copied from a baby's chart. Lactation Consultation Note  Patient Name: Amanda Church FWYOV'Z Date: 08/17/2021 Reason for consult: L&D Initial assessment;Term;Maternal endocrine disorder Age:35 hours  LC in to assist with first feeding at the breast.  Baby STS and cueing.  Mom shared that she had difficulty breastfeeding her last 2 babies due to a low milk supply (problems with babies gaining weight).  The is # 7 baby for Mom and she would really like to breastfeed this baby.  Reviewed hand expression and Mom was so thrilled to see her colostrum.    Baby positioned in football hold on right breast.  Baby latched after a couple attempts and sucked vigorously with deep jaw extensions and swallows identified.  Talked to Mom about adding some double pumping to the plan if she would like to stimulate her milk supply after baby breastfeeds.  LC will reassess and re-evaluate once baby and Mom are on MBU.  Maternal Data Has patient been taught Hand Expression?: Yes Does the patient have breastfeeding experience prior to this delivery?: Yes How long did the patient breastfeed?: 6 months until last 2 babies where her milk supply was low  Feeding Mother's Current Feeding Choice: Breast Milk  LATCH Score Latch: Grasps breast easily, tongue down, lips flanged, rhythmical sucking.  Audible Swallowing: Spontaneous and intermittent  Type of Nipple: Everted at rest and after stimulation  Comfort (Breast/Nipple): Soft / non-tender  Hold (Positioning): Assistance needed to correctly position infant at breast and maintain latch.  LATCH Score: 9   Interventions Interventions: Breast feeding basics reviewed;Assisted with latch;Skin to skin;Breast massage;Hand express;Support pillows;Adjust position;Position options  Consult Status Consult Status: Follow-up from L&D Date: 08/17/21 Follow-up type: In-patient    Judee Clara 08/17/2021, 2:48 PM

## 2021-08-18 ENCOUNTER — Inpatient Hospital Stay (HOSPITAL_COMMUNITY)
Admission: RE | Admit: 2021-08-18 | Payer: Medicaid Other | Source: Home / Self Care | Admitting: Obstetrics and Gynecology

## 2021-08-18 ENCOUNTER — Encounter (HOSPITAL_COMMUNITY): Admission: RE | Payer: Self-pay | Source: Home / Self Care

## 2021-08-18 LAB — CBC
HCT: 26.3 % — ABNORMAL LOW (ref 36.0–46.0)
Hemoglobin: 7.9 g/dL — ABNORMAL LOW (ref 12.0–15.0)
MCH: 23.2 pg — ABNORMAL LOW (ref 26.0–34.0)
MCHC: 30 g/dL (ref 30.0–36.0)
MCV: 77.4 fL — ABNORMAL LOW (ref 80.0–100.0)
Platelets: 206 10*3/uL (ref 150–400)
RBC: 3.4 MIL/uL — ABNORMAL LOW (ref 3.87–5.11)
RDW: 21.6 % — ABNORMAL HIGH (ref 11.5–15.5)
WBC: 8.7 10*3/uL (ref 4.0–10.5)
nRBC: 0 % (ref 0.0–0.2)

## 2021-08-18 LAB — GLUCOSE, CAPILLARY
Glucose-Capillary: 69 mg/dL — ABNORMAL LOW (ref 70–99)
Glucose-Capillary: 86 mg/dL (ref 70–99)

## 2021-08-18 SURGERY — Surgical Case
Anesthesia: Regional

## 2021-08-18 MED ORDER — SODIUM CHLORIDE 0.9 % IV SOLN
500.0000 mg | Freq: Once | INTRAVENOUS | Status: AC
  Administered 2021-08-18: 500 mg via INTRAVENOUS
  Filled 2021-08-18 (×2): qty 25

## 2021-08-18 NOTE — Progress Notes (Signed)
POSTPARTUM PROGRESS NOTE  Subjective: Amanda Church is a 36 y.o. H1T0569 PPD#1 SVD at [redacted]w[redacted]d.  She reports she doing well. No acute events overnight. She denies any problems with ambulating, voiding or po intake. Denies nausea or vomiting. She has passed flatus. Pain is moderately controlled.  Lochia is scant.  Objective: Blood pressure (!) 120/57, pulse 82, temperature 98.2 F (36.8 C), temperature source Oral, resp. rate 18, height 5\' 1"  (1.549 m), weight 117.5 kg, last menstrual period 11/08/2020, SpO2 100 %, unknown if currently breastfeeding.  Physical Exam:  General: alert, cooperative and no distress Chest: no respiratory distress Abdomen: soft, non-tender  Uterine Fundus: firm, appropriately tender Extremities: No calf swelling or tenderness  mild edema  Recent Labs    08/16/21 1502 08/18/21 0511  HGB 9.4* 7.9*  HCT 30.7* 26.3*    Assessment/Plan: Amanda Church is a 36 y.o. 31 PPD#1 SVD at [redacted]w[redacted]d  Routine Postpartum Care: Doing well, pain well-controlled.  -- Continue routine care, lactation support  -- Contraception: plans interval BTS -- Feeding: breast  #Iron deficiency anemia HgB 9.4>7.9. Discussed IV iron and ordered  Dispo: Plan for discharge PPD#2 per patient preference.  [redacted]w[redacted]d, MD, MPH OB Fellow, [facility] for [facility]

## 2021-08-18 NOTE — Clinical Social Work Maternal (Signed)
CLINICAL SOCIAL WORK MATERNAL/CHILD NOTE  Patient Details  Name: Amanda Church MRN: [id number] Date of Birth: 27-May-1985  Date:  08/18/2021  Clinical Social Worker Initiating Note:  Abundio Miu, [place] Date/Church: Initiated:  08/18/21/1515     Child's Name:  Amanda Church Last name: Amanda Church   Biological Parents:  Mother, Father (Father: Amanda Church)   Need for Interpreter:  None   Reason for Referral:  Behavioral Health Concerns   Address:  [address] [place] [address]    Phone number:  [phone] (home)     Additional phone number:   Household Members/Support Persons (HM/SP):   Household Member/Support Person 1, Household Member/Support Person 2, Household Member/Support Person 3, Household Member/Support Person 4, Household Member/Support Person 5, Household Member/Support Person 6, Household Member/Support Person 7, Household Member/Support Person 8   HM/SP Name Relationship DOB or Age  HM/SP -1 Amanda Church FOB    HM/SP -Amanda Church 10/14/07  HM/SP -3 Amanda Church son 03/30/09  HM/SP -4 Amanda Church son 01/20/11  HM/SP -5 Amanda Church Church 10/02/13  HM/SP -6 Amanda Church Church 03/06/16  HM/SP -7 Amanda Church son 04/17/17  HM/SP -8   Amanda Church      Natural Supports (not living in the home):  Immediate Family   Professional Supports: None   Employment: Unemployed   Type of Work:     Education:  [employer] arranged:    Museum/gallery curator Resources:  [employer]   Other Resources:  [employer], Physicist, medical     Cultural/Religious Considerations Which May Impact Care:    Strengths:  Ability to meet basic needs  , Pediatrician chosen   Psychotropic Medications:         Pediatrician:    [place] area  Pediatrician List:   [facility] ([address])  [place]       Pediatrician Fax Number:    Risk Factors/Current Problems:  Mental Health Concerns     Cognitive State:  Alert  , Able to Concentrate  , Goal Oriented  , Linear Thinking  , Insightful     Mood/Affect:  Calm  , Happy  , Interested  , Comfortable  , Relaxed     CSW Assessment: CSW met with MOB at bedside to complete psychosocial assessment. CSW introduced self and explained reason for consult. MOB was welcoming, open, pleasant, and remained engaged during assessment. MOB reported that she resides with FOB and her older children at her Amanda Church's home. MOB reported that she receives both [facility] and food stamps. MOB reported that she has some items for infant and needs to get more items. MOB reported that they may be able to get a car seat for infant as FOB is supposed to go get one today. CSW informed MOB about the hospital car seat program if any assistance is needed obtaining a car seat, MOB agreed to notify CSW if any assistance is needed with getting a car seat. CSW provided MOB with CSW contact information. CSW asked if MOB had a safe sleeping area for infant, MOB reported no. CSW informed MOB that a pack and play can be provided, MOB agreeable. CSW agreed to deliver pack and play to the room. CSW asked if a baby bundle would be helpful, MOB reported yes. CSW agreed to provide baby bundle. MOB reported  that she will be able to get the rest of items needed to care for infant. CSW inquired about MOB's support system, MOB reported that her boyfriend/FOB and aunt are supports. CSW inquired about food insecurity, MOB explained that due to their current housing situation they are more so having an issue with space for the food. MOB reported that it has been more costly to buy less food more often. CSW provided MOB with local food pantry resource.  CSW inquired about MOB's mental health history. MOB reported that she was diagnosed with anxiety and depression at age 27. MOB reported that she was diagnosed  with Bipolar Disorder in 2019 and feels that it is an accurate diagnosis. MOB described her Bipolar as having mood swings. MOB denied any auditory or visual hallucinations associated with her Bipolar Disorder. MOB denied any current symptoms of Bipolar Disorder. MOB endorsed some symptoms of anxiety, noting she worries excessively at times. MOB reported that she was taking medication prior to pregnancy and she is unsure if it was helpful. MOB shared that she does not like to take medication as she does not like the side effects. CSW inquired about MOB's coping skills, MOB reported that she reads a lot and has started back writing again. CSW positively affirmed MOB's healthy coping skills. MOB reported that she is working with her aunt to restart therapy as she feels it was helpful in the past. MOB denied any history of postpartum depression. CSW inquired about how MOB was feeling emotionally since giving birth, MOB reported that she was feeling happy. MOB presented calm and did not demonstrate any acute mental health signs/symptoms. CSW assessed for safety, MOB denied SI, HI, and domestic violence.   CSW provided education regarding the baby blues period vs. perinatal mood disorders, discussed treatment and gave resources for mental health follow up if concerns arise.  CSW recommends self-evaluation during the postpartum Church period using the New Mom Checklist from Postpartum Progress and encouraged MOB to contact a medical professional if symptoms are noted at any Church.    CSW provided review of Sudden Infant Death Syndrome (SIDS) precautions.    CSW inquired about any additional needs for resources/supports, MOB reported none.   CSW identifies no further need for intervention and no barriers to discharge at this Church.  CSW delivered pack and play and baby bundle to infant's room. MOB thanked CSW.   CSW Plan/Description:  No Further Intervention Required/No Barriers to Discharge, Sudden Infant Death  Syndrome (SIDS) Education, Perinatal Mood and Anxiety Disorder (PMADs) Education, Other Information/Referral to [employer], LCSW 08/18/2021, 3:20 PM

## 2021-08-18 NOTE — Anesthesia Postprocedure Evaluation (Signed)
Anesthesia Post Note  Patient: Amanda Church  Procedure(s) Performed: AN AD HOC LABOR EPIDURAL     Patient location during evaluation: Mother Baby Anesthesia Type: Epidural Level of consciousness: awake and alert Pain management: pain level controlled Vital Signs Assessment: post-procedure vital signs reviewed and stable Respiratory status: spontaneous breathing, nonlabored ventilation and respiratory function stable Cardiovascular status: stable Postop Assessment: no headache, no backache and epidural receding Anesthetic complications: no   No notable events documented.  Last Vitals:  Vitals:   08/18/21 0201 08/18/21 0541  BP: 121/77 (!) 120/57  Pulse: 83 82  Resp: 18 18  Temp: 36.8 C 36.8 C  SpO2: 98% 100%    Last Pain:  Vitals:   08/18/21 0541  TempSrc: Oral  PainSc: 2    Pain Goal:                   [place]

## 2021-08-18 NOTE — Progress Notes (Signed)
Circumcision Consent   Discussed with mom at bedside about circumcision.    Circumcision is a surgery that removes the skin that covers the tip of the penis, called the "foreskin." Circumcision is usually done when a boy is between 59 and 29 days old, sometimes up to 25-25 weeks old.   The most common reasons boys are circumcised include for cultural/religious beliefs or for parental preference (potentially easier to clean, so baby looks like daddy, etc).   There may be some medical benefits for circumcision:    Circumcised boys seem to have slightly lower rates of: ? Urinary tract infections (per the American Academy of Pediatrics an uncircumcised boy has a 1/100 chance of developing a UTI in the first year of life, a circumcised boy at a 03/998 chance of developing a UTI in the first year of life- a 10% reduction) ? Penis cancer (typically rare- an uncircumcised female has a 1 in 100,000 chance of developing cancer of the penis) ? Sexually transmitted infection (in endemic areas, including HIV, HPV and Herpes- circumcision does NOT protect against gonorrhea, chlamydia, trachomatis, or syphilis) ? Phimosis: a condition where that makes retraction of the foreskin over the glans impossible (0.4 per 1000 boys per year or 0.6% of boys are affected by their 15th birthday)   Boys and men who are not circumcised can reduce these extra risks by: ? Cleaning their penis well ? Using condoms during sex   What are the risks of circumcision?   As with any surgical procedure, there are risks and complications. In circumcision, complications are rare and usually minor, the most common being: ? Bleeding- risk is reduced by holding each clamp for 30 seconds prior to a cut being made, and by holding pressure after the procedure is done ? Infection- the penis is cleaned prior to the procedure, and the procedure is done under sterile technique ? Damage to the urethra or amputation of the penis   All questions  were answered and mother consented.  Cornelia Copa MD Attending [facility] [employer] (Faculty Practice) 08/18/2021 Time: [phone]

## 2021-08-19 MED ORDER — ACETAMINOPHEN 325 MG PO TABS: 650.0000 mg | ORAL_TABLET | ORAL | 0 refills | Status: DC | PRN

## 2021-08-19 MED ORDER — IBUPROFEN 600 MG PO TABS: 600.0000 mg | ORAL_TABLET | Freq: Four times a day (QID) | ORAL | 0 refills | Status: DC | PRN

## 2021-08-19 MED ORDER — NORETHINDRONE 0.35 MG PO TABS: 1.0000 | ORAL_TABLET | Freq: Every day | ORAL | 11 refills | Status: DC

## 2021-08-19 NOTE — Lactation Note (Signed)
This note was copied from a baby's chart. Lactation Consultation Note  Patient Name: Amanda Church Date: 08/19/2021 Reason for consult: Follow-up assessment;Term Age:35 hours   P7 mother whose infant is now 60 hours old.  This is a term baby at 39+6 weeks.  Mother's current feeding preference is breast.  Mother is feeling more encouraged now that her son is latching and feeding.  She hopes to successfully breast feed. Last LATCH score was a 9; voiding/stooling well. Mother reported feeling exhausted due to cluster feeding last.  Explained that this is typical and that feeding frequently will help ensure a good milk supply.  Reviewed feeding plan for after discharge.  Discussed the importance of good nutrition, hydration and rest.  Suggested father care for baby and other children after arriving home so mother can take a good nap.  Mother verbalized understanding.  She will continue to feed on cue.  Mother has our OP phone number for any concerns after discharge.  Father present and asleep.   Maternal Data    Feeding Mother's Current Feeding Choice: Breast Milk  LATCH Score                    Lactation Tools Discussed/Used    Interventions Interventions: Education  Discharge Discharge Education: Engorgement and breast care Pump: Manual;Personal  Consult Status Consult Status: Complete Date: 08/19/21 Follow-up type: Call as needed    Amanda Church 08/19/2021, 9:14 AM

## 2021-08-19 NOTE — Progress Notes (Signed)
MOB and RN notified CSW that MOB needed a car seat.   CSW delivered car seat to the room. CSW obtained MOB's signature on car seat form and MOB paid $30 for the car seat. CSW will provide form and money to volunteer services. CSW inquired about any additional needs, MOB reported none. CSW encouraged MOB to contact CSW if any additional needs/concerns arise. MOB thanked CSW.  Abundio Miu, [place] Worker [facility] Cell#: [phone]

## 2021-08-22 ENCOUNTER — Ambulatory Visit (INDEPENDENT_AMBULATORY_CARE_PROVIDER_SITE_OTHER): Payer: Medicaid Other

## 2021-08-22 ENCOUNTER — Telehealth (HOSPITAL_COMMUNITY): Payer: Self-pay | Admitting: Emergency Medicine

## 2021-08-22 ENCOUNTER — Other Ambulatory Visit: Payer: Self-pay

## 2021-08-22 ENCOUNTER — Encounter (HOSPITAL_COMMUNITY): Payer: Self-pay | Admitting: Pharmacy Technician

## 2021-08-22 ENCOUNTER — Inpatient Hospital Stay (HOSPITAL_COMMUNITY): Payer: Medicaid Other

## 2021-08-22 ENCOUNTER — Inpatient Hospital Stay (HOSPITAL_COMMUNITY)
Admission: EM | Admit: 2021-08-22 | Discharge: 2021-08-24 | DRG: 776 | Disposition: A | Discharge status: Home / Self Care | Payer: Medicaid Other | Attending: Obstetrics & Gynecology | Admitting: Obstetrics & Gynecology

## 2021-08-22 ENCOUNTER — Emergency Department (HOSPITAL_COMMUNITY): Payer: Medicaid Other

## 2021-08-22 ENCOUNTER — Ambulatory Visit (HOSPITAL_COMMUNITY)
Admission: EM | Admit: 2021-08-22 | Discharge: 2021-08-22 | Disposition: A | Discharge status: Home / Self Care | Payer: Medicaid Other | Source: Home / Self Care | Attending: Family Medicine | Admitting: Family Medicine

## 2021-08-22 ENCOUNTER — Encounter (HOSPITAL_COMMUNITY): Payer: Self-pay | Admitting: *Deleted

## 2021-08-22 DIAGNOSIS — O9943 Diseases of the circulatory system complicating the puerperium: Secondary | ICD-10-CM

## 2021-08-22 DIAGNOSIS — R0602 Shortness of breath: Secondary | ICD-10-CM

## 2021-08-22 DIAGNOSIS — J810 Acute pulmonary edema: Secondary | ICD-10-CM | POA: Diagnosis not present

## 2021-08-22 DIAGNOSIS — O1495 Unspecified pre-eclampsia, complicating the puerperium: Principal | ICD-10-CM

## 2021-08-22 DIAGNOSIS — R079 Chest pain, unspecified: Secondary | ICD-10-CM | POA: Insufficient documentation

## 2021-08-22 DIAGNOSIS — O99335 Smoking (tobacco) complicating the puerperium: Secondary | ICD-10-CM | POA: Diagnosis present

## 2021-08-22 DIAGNOSIS — F17210 Nicotine dependence, cigarettes, uncomplicated: Secondary | ICD-10-CM | POA: Diagnosis present

## 2021-08-22 DIAGNOSIS — J811 Chronic pulmonary edema: Secondary | ICD-10-CM | POA: Diagnosis present

## 2021-08-22 DIAGNOSIS — E079 Disorder of thyroid, unspecified: Secondary | ICD-10-CM | POA: Insufficient documentation

## 2021-08-22 DIAGNOSIS — O9953 Diseases of the respiratory system complicating the puerperium: Secondary | ICD-10-CM | POA: Diagnosis not present

## 2021-08-22 LAB — CBC WITH DIFFERENTIAL/PLATELET
Abs Immature Granulocytes: 0.08 10*3/uL — ABNORMAL HIGH (ref 0.00–0.07)
Abs Immature Granulocytes: 0.11 10*3/uL — ABNORMAL HIGH (ref 0.00–0.07)
Basophils Absolute: 0 10*3/uL (ref 0.0–0.1)
Basophils Absolute: 0 10*3/uL (ref 0.0–0.1)
Basophils Relative: 0 %
Basophils Relative: 1 %
Eosinophils Absolute: 0.1 10*3/uL (ref 0.0–0.5)
Eosinophils Absolute: 0.1 10*3/uL (ref 0.0–0.5)
Eosinophils Relative: 3 %
Eosinophils Relative: 2 %
HCT: 30.4 % — ABNORMAL LOW (ref 36.0–46.0)
HCT: 31.3 % — ABNORMAL LOW (ref 36.0–46.0)
Hemoglobin: 8.9 g/dL — ABNORMAL LOW (ref 12.0–15.0)
Hemoglobin: 9.1 g/dL — ABNORMAL LOW (ref 12.0–15.0)
Immature Granulocytes: 1 %
Immature Granulocytes: 2 %
Lymphocytes Relative: 30 %
Lymphocytes Relative: 35 %
Lymphs Abs: 1.7 10*3/uL (ref 0.7–4.0)
Lymphs Abs: 2 10*3/uL (ref 0.7–4.0)
MCH: 23.2 pg — ABNORMAL LOW (ref 26.0–34.0)
MCH: 23.6 pg — ABNORMAL LOW (ref 26.0–34.0)
MCHC: 29.3 g/dL — ABNORMAL LOW (ref 30.0–36.0)
MCHC: 29.1 g/dL — ABNORMAL LOW (ref 30.0–36.0)
MCV: 79.4 fL — ABNORMAL LOW (ref 80.0–100.0)
MCV: 81.1 fL (ref 80.0–100.0)
Monocytes Absolute: 0.4 10*3/uL (ref 0.1–1.0)
Monocytes Absolute: 0.4 10*3/uL (ref 0.1–1.0)
Monocytes Relative: 7 %
Monocytes Relative: 7 %
Neutro Abs: 3.4 10*3/uL (ref 1.7–7.7)
Neutro Abs: 3.1 10*3/uL (ref 1.7–7.7)
Neutrophils Relative %: 59 %
Neutrophils Relative %: 53 %
Platelets: 290 10*3/uL (ref 150–400)
Platelets: 272 10*3/uL (ref 150–400)
RBC: 3.83 MIL/uL — ABNORMAL LOW (ref 3.87–5.11)
RBC: 3.86 MIL/uL — ABNORMAL LOW (ref 3.87–5.11)
RDW: 23.3 % — ABNORMAL HIGH (ref 11.5–15.5)
RDW: 23.4 % — ABNORMAL HIGH (ref 11.5–15.5)
WBC: 5.7 10*3/uL (ref 4.0–10.5)
WBC: 5.7 10*3/uL (ref 4.0–10.5)
nRBC: 0.4 % — ABNORMAL HIGH (ref 0.0–0.2)
nRBC: 0.9 % — ABNORMAL HIGH (ref 0.0–0.2)

## 2021-08-22 LAB — COMPREHENSIVE METABOLIC PANEL
ALT: 37 U/L (ref 0–44)
AST: 29 U/L (ref 15–41)
Albumin: 2.7 g/dL — ABNORMAL LOW (ref 3.5–5.0)
Alkaline Phosphatase: 88 U/L (ref 38–126)
Anion gap: 9 (ref 5–15)
BUN: 12 mg/dL (ref 6–20)
CO2: 20 mmol/L — ABNORMAL LOW (ref 22–32)
Calcium: 8.5 mg/dL — ABNORMAL LOW (ref 8.9–10.3)
Chloride: 111 mmol/L (ref 98–111)
Creatinine, Ser: 0.69 mg/dL (ref 0.44–1.00)
GFR, Estimated: >60 mL/min (ref >60)
Glucose, Bld: 75 mg/dL (ref 70–99)
Potassium: 4.2 mmol/L (ref 3.5–5.1)
Sodium: 140 mmol/L (ref 135–145)
Total Bilirubin: 0.3 mg/dL (ref 0.3–1.2)
Total Protein: 5.7 g/dL — ABNORMAL LOW (ref 6.5–8.1)

## 2021-08-22 LAB — D-DIMER, QUANTITATIVE: D-Dimer, Quant: 1.99 ug/mL-FEU — ABNORMAL HIGH (ref 0.00–0.50)

## 2021-08-22 LAB — TROPONIN I (HIGH SENSITIVITY)
Troponin I (High Sensitivity): 6 ng/L (ref <18)
Troponin I (High Sensitivity): 7 ng/L (ref <18)

## 2021-08-22 LAB — BRAIN NATRIURETIC PEPTIDE: B Natriuretic Peptide: 364.8 pg/mL — ABNORMAL HIGH (ref 0.0–100.0)

## 2021-08-22 MED ORDER — LABETALOL HCL 5 MG/ML IV SOLN: 20.0000 mg | INTRAVENOUS | Status: DC | PRN

## 2021-08-22 MED ORDER — ONDANSETRON 4 MG PO TBDP
4.0000 mg | ORAL_TABLET | Freq: Four times a day (QID) | ORAL | Status: DC | PRN
Start: 2021-08-22 — End: 2021-08-24

## 2021-08-22 MED ORDER — HYDRALAZINE HCL 20 MG/ML IJ SOLN: 5.0000 mg | INTRAMUSCULAR | Status: DC | PRN

## 2021-08-22 MED ORDER — HYDRALAZINE HCL 20 MG/ML IJ SOLN: 10.0000 mg | INTRAMUSCULAR | Status: DC | PRN

## 2021-08-22 MED ORDER — SODIUM CHLORIDE 0.9 % IV SOLN: INTRAVENOUS | Status: DC

## 2021-08-22 MED ORDER — MAGNESIUM SULFATE BOLUS VIA INFUSION
4.0000 g | Freq: Once | INTRAVENOUS | Status: AC
  Administered 2021-08-22: 4 g via INTRAVENOUS
  Filled 2021-08-22: qty 1000

## 2021-08-22 MED ORDER — IBUPROFEN 600 MG PO TABS
600.0000 mg | ORAL_TABLET | Freq: Four times a day (QID) | ORAL | Status: DC | PRN
  Administered 2021-08-23 – 2021-08-24 (×3): 600 mg via ORAL
  Filled 2021-08-22 (×3): qty 1

## 2021-08-22 MED ORDER — LABETALOL HCL 5 MG/ML IV SOLN: 40.0000 mg | INTRAVENOUS | Status: DC | PRN

## 2021-08-22 MED ORDER — IOHEXOL 350 MG/ML SOLN
80.0000 mL | Freq: Once | INTRAVENOUS | Status: AC | PRN
  Administered 2021-08-22: 80 mL via INTRAVENOUS

## 2021-08-22 MED ORDER — FUROSEMIDE 10 MG/ML IJ SOLN
40.0000 mg | Freq: Four times a day (QID) | INTRAMUSCULAR | Status: AC
  Administered 2021-08-22 – 2021-08-23 (×4): 40 mg via INTRAVENOUS
  Filled 2021-08-22 (×4): qty 4

## 2021-08-22 MED ORDER — ZOLPIDEM TARTRATE 5 MG PO TABS
5.0000 mg | ORAL_TABLET | Freq: Every evening | ORAL | Status: DC | PRN
Start: 2021-08-22 — End: 2021-08-24
  Administered 2021-08-23: 5 mg via ORAL
  Filled 2021-08-22: qty 1

## 2021-08-22 MED ORDER — MAGNESIUM SULFATE 40 GM/1000ML IV SOLN
2.0000 g/h | INTRAVENOUS | Status: DC
  Administered 2021-08-22: 2 g/h via INTRAVENOUS
  Filled 2021-08-22: qty 1000

## 2021-08-22 NOTE — MAU Note (Signed)
.  Amanda Church is a 36 y.o. at Unknown here in MAU reporting: HA, SOB, productive cough with yellow phlegm, and the feeling that something thick is stuck in her throat. She feels like she "can't walk right" since delivering. Reports sharp pain in her right chest when she takes a deep breath (7/10). Her left leg hurts and she feels like she has to pick it up to move it (6/10 without movement and 8/10 with movement). Reports delivering via VBAC without any complications. Her 35 year old had strep throat before she went into labor; denies being in contact with anyone else who may have been sick.    Onset of complaint: Monday night/Tuesday morning Pain score: see note

## 2021-08-22 NOTE — Discharge Instructions (Signed)
You have had labs (blood work) sent today. We will call you with any significant abnormalities or if there is need to begin or change treatment or pursue further follow up.

## 2021-08-22 NOTE — ED Triage Notes (Signed)
Pt here with upper chest pain, shob with exertion and worse when lying down. Pt 5 days postpartum.

## 2021-08-22 NOTE — ED Triage Notes (Signed)
Pt reports she is 5 days post partum. Pt DC from [facility] 08-19-21 and has had a cough ,SHOB and Rt sided CP.

## 2021-08-22 NOTE — Progress Notes (Signed)
This RN spoke with patient's spouse and FOBs, with pt permission, to emphasize the importance of another adult's presence to care for breastfeeding infant while pt admitted. CT is ready for the patient when someone arrives to care for infant. Provider made aware of this delay. [employer] [facility], RN
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
## 2021-08-22 NOTE — Telephone Encounter (Signed)
Patient is being sent post-discharge from Urgent Care to the Emergency Department via private vehicle . Per Dr. Mannie Stabile, patient is in need of higher level of care due to elevated d-dimer in the presence of SOB and chest pain. Patient is aware and verbalizes understanding of plan of care.

## 2021-08-22 NOTE — ED Provider Triage Note (Addendum)
Emergency Medicine Provider Triage Evaluation Note  Amanda Church , a 36 y.o. female  was evaluated in triage.  Pt complains of chest pain ongoing for about 2 days.  She is recently postpartum.  Pain is pleuritic.  She was evaluated at urgent care had D-dimer which was elevated and she was referred to the emergency room.   Review of Systems  Positive: As above Negative: As above  Physical Exam  BP (!) 148/80 (BP Location: Right Arm)   Pulse 77   Temp 98.3 F (36.8 C) (Oral)   Resp 16   SpO2 99%  Gen:   Awake, no distress   Resp:  Normal effort  MSK:   Moves extremities without difficulty  Other:    Medical Decision Making  Medically screening exam initiated at 5:49 PM.  Appropriate orders placed.  Amanda Church was informed that the remainder of the evaluation will be completed by another provider, this initial triage assessment does not replace that evaluation, and the importance of remaining in the ED until their evaluation is complete.  Addendum.  1930: Discussed with MAU APP who will accept to MAU service.   Amanda [place], PA-C 08/22/21 1750    Amanda [place], PA-C 08/22/21 1933

## 2021-08-23 ENCOUNTER — Inpatient Hospital Stay (HOSPITAL_COMMUNITY): Payer: Medicaid Other

## 2021-08-23 DIAGNOSIS — R0602 Shortness of breath: Secondary | ICD-10-CM | POA: Diagnosis not present

## 2021-08-23 DIAGNOSIS — R079 Chest pain, unspecified: Secondary | ICD-10-CM | POA: Diagnosis not present

## 2021-08-23 DIAGNOSIS — O1495 Unspecified pre-eclampsia, complicating the puerperium: Secondary | ICD-10-CM | POA: Diagnosis not present

## 2021-08-23 LAB — ECHOCARDIOGRAM COMPLETE
Area-P 1/2: 2.45 cm2
Calc EF: 57.2 %
Height: 61 in
MV M vel: 5.35 m/s
MV Peak grad: 114.5 mmHg
Radius: 0.4 cm
S' Lateral: 3.6 cm
Single Plane A2C EF: 54.9 %
Single Plane A4C EF: 58.4 %
Weight: 4022.4 oz

## 2021-08-23 MED ORDER — KETOROLAC TROMETHAMINE 30 MG/ML IJ SOLN
30.0000 mg | Freq: Once | INTRAMUSCULAR | Status: AC
  Administered 2021-08-23: 30 mg via INTRAVENOUS
  Filled 2021-08-23: qty 1

## 2021-08-23 MED ORDER — ACETAMINOPHEN 325 MG PO TABS
650.0000 mg | ORAL_TABLET | Freq: Four times a day (QID) | ORAL | Status: DC | PRN
  Administered 2021-08-23: 650 mg via ORAL
  Filled 2021-08-23: qty 2

## 2021-08-23 MED ORDER — KETOROLAC TROMETHAMINE 30 MG/ML IJ SOLN: 30.0000 mg | Freq: Once | INTRAMUSCULAR | Status: DC

## 2021-08-23 MED ORDER — GUAIFENESIN 100 MG/5ML PO LIQD
5.0000 mL | ORAL | Status: DC | PRN
  Administered 2021-08-23 (×2): 5 mL via ORAL
  Filled 2021-08-23 (×2): qty 15

## 2021-08-23 NOTE — Progress Notes (Signed)
ACULTY PRACTICE Postpartum COMPREHENSIVE PROGRESS NOTE PPD # 6  Amanda Church is a 36 y.o. [id number] at Unknown  who is admitted for [facility] and pulmonary edema in a postpartum setting.    Length of Stay:  1  Days  Subjective: Pt reports feeling much better. No more CP and breathing is much improved. Some non productive cough   Vitals:  Blood pressure 135/77, pulse 80, temperature 97.7 F (36.5 C), temperature source Oral, resp. rate 18, height 5\' 1"  (1.549 m), weight 114 kg, SpO2 98 %, currently breastfeeding. Physical Examination: Lungs clear, faint bibasilar crackles Heart RRR Abd soft + BS U-3 GU nl lochia Ext no edema   Labs:  Results for orders placed or performed during the hospital encounter of 08/22/21 (from the past 24 hour(s))  CBC with Differential   Collection Time: 08/22/21  5:57 PM  Result Value Ref Range   WBC 5.7 4.0 - 10.5 K/uL   RBC 3.86 (L) 3.87 - 5.11 MIL/uL   Hemoglobin 9.1 (L) 12.0 - 15.0 g/dL   HCT 08/24/21 (L) 48.1 - 85.6 %   MCV 81.1 80.0 - 100.0 fL   MCH 23.6 (L) 26.0 - 34.0 pg   MCHC 29.1 (L) 30.0 - 36.0 g/dL   RDW 31.4 (H) 97.0 - 26.3 %   Platelets 272 150 - 400 K/uL   nRBC 0.9 (H) 0.0 - 0.2 %   Neutrophils Relative % 53 %   Neutro Abs 3.1 1.7 - 7.7 K/uL   Lymphocytes Relative 35 %   Lymphs Abs 2.0 0.7 - 4.0 K/uL   Monocytes Relative 7 %   Monocytes Absolute 0.4 0.1 - 1.0 K/uL   Eosinophils Relative 2 %   Eosinophils Absolute 0.1 0.0 - 0.5 K/uL   Basophils Relative 1 %   Basophils Absolute 0.0 0.0 - 0.1 K/uL   Immature Granulocytes 2 %   Abs Immature Granulocytes 0.11 (H) 0.00 - 0.07 K/uL   Polychromasia PRESENT   Comprehensive metabolic panel   Collection Time: 08/22/21  5:57 PM  Result Value Ref Range   Sodium 140 135 - 145 mmol/L   Potassium 4.2 3.5 - 5.1 mmol/L   Chloride 111 98 - 111 mmol/L   CO2 20 (L) 22 - 32 mmol/L   Glucose, Bld 75 70 - 99 mg/dL   BUN 12 6 - 20 mg/dL   Creatinine, Ser 08/24/21 0.44 - 1.00 mg/dL   Calcium 8.5 (L)  8.9 - 10.3 mg/dL   Total Protein 5.7 (L) 6.5 - 8.1 g/dL   Albumin 2.7 (L) 3.5 - 5.0 g/dL   AST 29 15 - 41 U/L   ALT 37 0 - 44 U/L   Alkaline Phosphatase 88 38 - 126 U/L   Total Bilirubin 0.3 0.3 - 1.2 mg/dL   GFR, Estimated 8.85 >02 mL/min   Anion gap 9 5 - 15  Troponin I (High Sensitivity)   Collection Time: 08/22/21  5:57 PM  Result Value Ref Range   Troponin I (High Sensitivity) 6 <18 ng/L  Brain natriuretic peptide   Collection Time: 08/22/21  7:55 PM  Result Value Ref Range   B Natriuretic Peptide 364.8 (H) 0.0 - 100.0 pg/mL  Troponin I (High Sensitivity)   Collection Time: 08/22/21  7:55 PM  Result Value Ref Range   Troponin I (High Sensitivity) 7 <18 ng/L  ECHOCARDIOGRAM COMPLETE   Collection Time: 08/23/21  8:42 AM  Result Value Ref Range   Weight 4,022.4 oz   Height 61 in  BP 135/77 mmHg  Results for orders placed or performed during the hospital encounter of 08/22/21 (from the past 24 hour(s))  CBC with Differential/Platelet   Collection Time: 08/22/21 12:38 PM  Result Value Ref Range   WBC 5.7 4.0 - 10.5 K/uL   RBC 3.83 (L) 3.87 - 5.11 MIL/uL   Hemoglobin 8.9 (L) 12.0 - 15.0 g/dL   HCT 56.3 (L) 87.5 - 64.3 %   MCV 79.4 (L) 80.0 - 100.0 fL   MCH 23.2 (L) 26.0 - 34.0 pg   MCHC 29.3 (L) 30.0 - 36.0 g/dL   RDW 32.9 (H) 51.8 - 84.1 %   Platelets 290 150 - 400 K/uL   nRBC 0.4 (H) 0.0 - 0.2 %   Neutrophils Relative % 59 %   Neutro Abs 3.4 1.7 - 7.7 K/uL   Lymphocytes Relative 30 %   Lymphs Abs 1.7 0.7 - 4.0 K/uL   Monocytes Relative 7 %   Monocytes Absolute 0.4 0.1 - 1.0 K/uL   Eosinophils Relative 3 %   Eosinophils Absolute 0.1 0.0 - 0.5 K/uL   Basophils Relative 0 %   Basophils Absolute 0.0 0.0 - 0.1 K/uL   Immature Granulocytes 1 %   Abs Immature Granulocytes 0.08 (H) 0.00 - 0.07 K/uL  D-dimer, quantitative   Collection Time: 08/22/21 12:38 PM  Result Value Ref Range   D-Dimer, Quant 1.99 (H) 0.00 - 0.50 ug/mL-FEU    Imaging Studies:    CT negative  for PE  Medications:  Scheduled  furosemide  40 mg Intravenous Q6H   I have reviewed the patient's current medications.  ASSESSMENT: Postpartum SPEC with pulmonary edema  PLAN: Improved. Will continue with Lasix  for dieresis. ECHO pending. Repeat CXR and labs tomorrow. Magnesium x 24 hours. BP stable will continue to follow.    Hermina Staggers 08/23/2021,8:56 AM

## 2021-08-23 NOTE — Progress Notes (Signed)
  Echocardiogram 2D Echocardiogram has been performed.  Amanda Church 08/23/2021, 9:34 AM

## 2021-08-24 ENCOUNTER — Inpatient Hospital Stay (HOSPITAL_COMMUNITY): Payer: Medicaid Other

## 2021-08-24 ENCOUNTER — Encounter: Payer: Self-pay | Admitting: Obstetrics & Gynecology

## 2021-08-24 ENCOUNTER — Encounter: Payer: Self-pay | Admitting: Obstetrics and Gynecology

## 2021-08-24 ENCOUNTER — Encounter: Payer: Self-pay | Admitting: Hematology

## 2021-08-24 ENCOUNTER — Other Ambulatory Visit (HOSPITAL_COMMUNITY): Payer: Self-pay

## 2021-08-24 DIAGNOSIS — J810 Acute pulmonary edema: Secondary | ICD-10-CM

## 2021-08-24 LAB — BASIC METABOLIC PANEL
Anion gap: 10 (ref 5–15)
BUN: 18 mg/dL (ref 6–20)
CO2: 24 mmol/L (ref 22–32)
Calcium: 8.3 mg/dL — ABNORMAL LOW (ref 8.9–10.3)
Chloride: 102 mmol/L (ref 98–111)
Creatinine, Ser: 0.85 mg/dL (ref 0.44–1.00)
GFR, Estimated: >60 mL/min (ref >60)
Glucose, Bld: 96 mg/dL (ref 70–99)
Potassium: 3.8 mmol/L (ref 3.5–5.1)
Sodium: 136 mmol/L (ref 135–145)

## 2021-08-24 MED ORDER — NIFEDIPINE ER 30 MG PO TB24
30.0000 mg | ORAL_TABLET | Freq: Every day | ORAL | 0 refills | Status: DC
  Filled 2021-08-24: qty 30, 30d supply, fill #0

## 2021-08-24 MED ORDER — NIFEDIPINE ER OSMOTIC RELEASE 30 MG PO TB24
30.0000 mg | ORAL_TABLET | Freq: Every day | ORAL | Status: DC
  Administered 2021-08-24: 30 mg via ORAL
  Filled 2021-08-24: qty 1

## 2021-08-24 MED ORDER — FUROSEMIDE 20 MG PO TABS
20.0000 mg | ORAL_TABLET | Freq: Two times a day (BID) | ORAL | 0 refills | Status: DC
  Filled 2021-08-24: qty 10, 5d supply, fill #0

## 2021-08-24 NOTE — MAU Provider Note (Signed)
Please refer to H & P for admission details.    Kelty Szafran, MD, FACOG Attending Obstetrician & Gynecologist Faculty Practice, [facility]   

## 2021-08-24 NOTE — Plan of Care (Signed)

## 2021-08-24 NOTE — Discharge Summary (Signed)
Physician Discharge Summary  Patient ID: Amanda Church MRN: [id number] DOB/AGE: 35/11/1985 35 y.o.  Admit date: 08/22/2021 Discharge date: 08/24/2021  Admission and Discharge Diagnoses:  Principal Problem:   Pre-eclampsia, postpartum Active Problems:   Acute pulmonary edema [facility](HCC)  Discharged Condition: Stable  Hospital Course: Amanda Church is a 36 y.o. [id number] postpartum from a vaginal delivery on 6/9 who presented for evaluation of chest pain, shortness of breath, headache  x 2 days.  Evaluation showed her to have postpartum preeclampsia and acute pulmonary edema.   BNP was 364 pg/mL, CXR showed pulmonary edema.  Negative studies for PE and DVT evaluation. She was initially treated with magnesium sulfate given concern about her headache as a severe feature, but this was discontinued after 12 hours given concern about her pulmonary edema. She was aggressively diuresed with Lasix, and felt significantly better. Her BP was managed on Procardia. Her ECHO was unremarkable.  On 08/24/21, she was deemed to be stable for discharge to home with close outpatient follow up.  Significant Diagnostic Studies:  Results for orders placed or performed during the hospital encounter of 08/22/21 (from the past 72 hour(s))  CBC with Differential     Status: Abnormal   Collection Time: 08/22/21  5:57 PM  Result Value Ref Range   WBC 5.7 4.0 - 10.5 K/uL   RBC 3.86 (L) 3.87 - 5.11 MIL/uL   Hemoglobin 9.1 (L) 12.0 - 15.0 g/dL   HCT 95.631.3 (L) 21.336.0 - 08.646.0 %   MCV 81.1 80.0 - 100.0 fL   MCH 23.6 (L) 26.0 - 34.0 pg   MCHC 29.1 (L) 30.0 - 36.0 g/dL   RDW 57.823.4 (H) 46.911.5 - 62.915.5 %   Platelets 272 150 - 400 K/uL   nRBC 0.9 (H) 0.0 - 0.2 %   Neutrophils Relative % 53 %   Neutro Abs 3.1 1.7 - 7.7 K/uL   Lymphocytes Relative 35 %   Lymphs Abs 2.0 0.7 - 4.0 K/uL   Monocytes Relative 7 %   Monocytes Absolute 0.4 0.1 - 1.0 K/uL   Eosinophils Relative 2 %   Eosinophils Absolute 0.1 0.0 - 0.5 K/uL   Basophils  Relative 1 %   Basophils Absolute 0.0 0.0 - 0.1 K/uL   Immature Granulocytes 2 %   Abs Immature Granulocytes 0.11 (H) 0.00 - 0.07 K/uL   Polychromasia PRESENT     Comment: Performed at [facility], [address] [place], [place] [address]  Comprehensive metabolic panel     Status: Abnormal   Collection Time: 08/22/21  5:57 PM  Result Value Ref Range   Sodium 140 135 - 145 mmol/L   Potassium 4.2 3.5 - 5.1 mmol/L   Chloride 111 98 - 111 mmol/L   CO2 20 (L) 22 - 32 mmol/L   Glucose, Bld 75 70 - 99 mg/dL    Comment: Glucose reference range applies only to samples taken after fasting for at least 8 hours.   BUN 12 6 - 20 mg/dL   Creatinine, Ser 3.240.69 0.44 - 1.00 mg/dL   Calcium 8.5 (L) 8.9 - 10.3 mg/dL   Total Protein 5.7 (L) 6.5 - 8.1 g/dL   Albumin 2.7 (L) 3.5 - 5.0 g/dL   AST 29 15 - 41 U/L   ALT 37 0 - 44 U/L   Alkaline Phosphatase 88 38 - 126 U/L   Total Bilirubin 0.3 0.3 - 1.2 mg/dL   GFR, Estimated >40>60 >10>60 mL/min    Comment: (NOTE) Calculated  using the CKD-EPI Creatinine Equation (2021)    Anion gap 9 5 - 15    Comment: Performed at [facility], [address], [place], [place] [address]  Troponin I (High Sensitivity)     Status: None   Collection Time: 08/22/21  5:57 PM  Result Value Ref Range   Troponin I (High Sensitivity) 6 <18 ng/L    Comment: (NOTE) Elevated high sensitivity troponin I (hsTnI) values and significant  changes across serial measurements may suggest ACS but many other  chronic and acute conditions are known to elevate hsTnI results.  Refer to the "Links" section for chest pain algorithms and additional  guidance. Performed at [facility], [address] [place], [place] [address]   Troponin I (High Sensitivity)     Status: None   Collection Time: 08/22/21  7:55 PM  Result Value Ref Range   Troponin I (High Sensitivity) 7 <18 ng/L    Comment: (NOTE) Elevated high sensitivity troponin I (hsTnI) values and significant  changes  across serial measurements may suggest ACS but many other  chronic and acute conditions are known to elevate hsTnI results.  Refer to the "Links" section for chest pain algorithms and additional  guidance. Performed at [facility], [address], [place], [place] [address]   Brain natriuretic peptide     Status: Abnormal   Collection Time: 08/22/21  7:55 PM  Result Value Ref Range   B Natriuretic Peptide 364.8 (H) 0.0 - 100.0 pg/mL    Comment: Performed at [facility], [address], [place], [place] [address]  Basic metabolic panel     Status: Abnormal   Collection Time: 08/24/21  4:16 AM  Result Value Ref Range   Sodium 136 135 - 145 mmol/L   Potassium 3.8 3.5 - 5.1 mmol/L   Chloride 102 98 - 111 mmol/L   CO2 24 22 - 32 mmol/L   Glucose, Bld 96 70 - 99 mg/dL    Comment: Glucose reference range applies only to samples taken after fasting for at least 8 hours.   BUN 18 6 - 20 mg/dL   Creatinine, Ser 4.40 0.44 - 1.00 mg/dL   Calcium 8.3 (L) 8.9 - 10.3 mg/dL   GFR, Estimated >34 >74 mL/min    Comment: (NOTE) Calculated using the CKD-EPI Creatinine Equation (2021)    Anion gap 10 5 - 15    Comment: Performed at [facility], [address], [place], [place] [address]   DG Chest 2 View  Result Date: 08/24/2021 CLINICAL DATA:  Provided history: Right-sided chest pain. EXAM: CHEST - 2 VIEW COMPARISON:  Chest CT 08/22/2021. Prior chest radiographs 08/22/2021 and earlier. FINDINGS: Heart size at the upper limits of normal. Previously demonstrated interstitial edema is no longer appreciated. No airspace consolidation. No evidence of pleural effusion or pneumothorax. No acute bony abnormality identified. IMPRESSION: No evidence of acute cardiopulmonary abnormality. Previously demonstrated interstitial edema is no longer appreciated. Electronically Signed   By: Jackey Loge D.O.   On: 08/24/2021 08:37   ECHOCARDIOGRAM COMPLETE  Result Date: 08/23/2021    ECHOCARDIOGRAM  REPORT   Patient Name:   Amanda Church [facility] Date of Exam: 08/23/2021 Medical Rec #:  [id number]         Height:       61.0 in Accession #:    [id number]        Weight:       251.4 lb Date of  Birth:  Dec 02, 1985          BSA:          2.082 m Patient Age:    36 years          BP:           135/77 mmHg Patient Gender: F                 HR:           76 bpm. Exam Location:  Inpatient Procedure: 2D Echo, Cardiac Doppler and Color Doppler Indications:    Pre-eclampsia, postpartum [536644]  History:        Patient has no prior history of Echocardiogram examinations.                 Risk Factors:Dyslipidemia.  Sonographer:    Eulah Pont RDCS Referring Phys: [id number] MICHAEL L ERVIN IMPRESSIONS  1. Left ventricular ejection fraction, by estimation, is 60 to 65%. The left ventricle has normal function. The left ventricle has no regional wall motion abnormalities. Left ventricular diastolic parameters were normal.  2. Right ventricular systolic function is normal. The right ventricular size is normal. There is normal pulmonary artery systolic pressure.  3. The mitral valve is normal in structure. Mild to moderate mitral valve regurgitation. No evidence of mitral stenosis.  4. The aortic valve is normal in structure. Aortic valve regurgitation is not visualized. No aortic stenosis is present.  5. The inferior vena cava is normal in size with greater than 50% respiratory variability, suggesting right atrial pressure of 3 mmHg. FINDINGS  Left Ventricle: Left ventricular ejection fraction, by estimation, is 60 to 65%. The left ventricle has normal function. The left ventricle has no regional wall motion abnormalities. The left ventricular internal cavity size was normal in size. There is  no left ventricular hypertrophy. Left ventricular diastolic parameters were normal. Right Ventricle: The right ventricular size is normal. No increase in right ventricular wall thickness. Right ventricular systolic function is normal. There is normal  pulmonary artery systolic pressure. The tricuspid regurgitant velocity is 2.61 m/s, and  with an assumed right atrial pressure of 8 mmHg, the estimated right ventricular systolic pressure is 35.2 mmHg. Left Atrium: Left atrial size was normal in size. Right Atrium: Right atrial size was normal in size. Pericardium: There is no evidence of pericardial effusion. Mitral Valve: The mitral valve is normal in structure. Mild to moderate mitral valve regurgitation. No evidence of mitral valve stenosis. Tricuspid Valve: The tricuspid valve is normal in structure. Tricuspid valve regurgitation is trivial. No evidence of tricuspid stenosis. Aortic Valve: The aortic valve is normal in structure. Aortic valve regurgitation is not visualized. No aortic stenosis is present. Pulmonic Valve: The pulmonic valve was normal in structure. Pulmonic valve regurgitation is not visualized. No evidence of pulmonic stenosis. Aorta: The aortic root is normal in size and structure. Venous: The inferior vena cava is normal in size with greater than 50% respiratory variability, suggesting right atrial pressure of 3 mmHg. IAS/Shunts: No atrial level shunt detected by color flow Doppler.  LEFT VENTRICLE PLAX 2D LVIDd:         5.60 cm      Diastology LVIDs:         3.60 cm      LV e' medial:    7.89 cm/s LV PW:         1.00 cm      LV E/e' medial:  18.0 LV IVS:  0.90 cm      LV e' lateral:   9.90 cm/s LVOT diam:     2.10 cm      LV E/e' lateral: 14.3 LV SV:         93 LV SV Index:   45 LVOT Area:     3.46 cm  LV Volumes (MOD) LV vol d, MOD A2C: 149.0 ml LV vol d, MOD A4C: 139.0 ml LV vol s, MOD A2C: 67.2 ml LV vol s, MOD A4C: 57.8 ml LV SV MOD A2C:     81.8 ml LV SV MOD A4C:     139.0 ml LV SV MOD BP:      83.5 ml RIGHT VENTRICLE RV S prime:     13.60 cm/s TAPSE (M-mode): 3.0 cm LEFT ATRIUM             Index        RIGHT ATRIUM           Index LA diam:        4.80 cm 2.31 cm/m   RA Area:     19.40 cm LA Vol (A2C):   66.9 ml 32.14 ml/m  RA  Volume:   50.70 ml  24.36 ml/m LA Vol (A4C):   61.4 ml 29.50 ml/m LA Biplane Vol: 66.0 ml 31.71 ml/m  AORTIC VALVE LVOT Vmax:   122.00 cm/s LVOT Vmean:  78.800 cm/s LVOT VTI:    0.268 m  AORTA Ao Root diam: 3.40 cm Ao Asc diam:  3.20 cm MITRAL VALVE                  TRICUSPID VALVE MV Area (PHT): 2.45 cm       TR Peak grad:   27.2 mmHg MV Decel Time: 310 msec       TR Vmax:        261.00 cm/s MR Peak grad:    114.5 mmHg MR Mean grad:    80.0 mmHg    SHUNTS MR Vmax:         535.00 cm/s  Systemic VTI:  0.27 m MR Vmean:        428.0 cm/s   Systemic Diam: 2.10 cm MR PISA:         1.01 cm MR PISA Eff ROA: 7 mm MR PISA Radius:  0.40 cm MV E velocity: 142.00 cm/s MV A velocity: 73.60 cm/s MV E/A ratio:  1.93 Kardie Tobb DO Electronically signed by Thomasene Ripple DO Signature Date/Time: 08/23/2021/4:57:54 PM    Final    CT Angio Chest Pulmonary Embolism (PE) W or WO Contrast  Result Date: 08/22/2021 CLINICAL DATA:  Pulmonary embolism (PE) suspected, high prob. Chest pain, dyspnea on exertion, 5 days postpartum. EXAM: CT ANGIOGRAPHY CHEST WITH CONTRAST TECHNIQUE: Multidetector CT imaging of the chest was performed using the standard protocol during bolus administration of intravenous contrast. Multiplanar CT image reconstructions and MIPs were obtained to evaluate the vascular anatomy. RADIATION DOSE REDUCTION: This exam was performed according to the departmental dose-optimization program which includes automated exposure control, adjustment of the mA and/or kV according to patient size and/or use of iterative reconstruction technique. CONTRAST:  61mL OMNIPAQUE IOHEXOL 350 MG/ML SOLN COMPARISON:  None Available. FINDINGS: Cardiovascular: Satisfactory opacification of the pulmonary arteries to the segmental level. No evidence of pulmonary embolism. Normal heart size. No pericardial effusion. Mediastinum/Nodes: There is asymmetric enlargement of the left thyroid lobe with substernal extension. Soft tissue within the  anterior mediastinum likely represents residual thymic tissue.  No pathologic thoracic adenopathy Lungs/Pleura: There is smooth interlobular septal thickening best appreciated at the lung bases with peripheral ground-glass pulmonary infiltrate and basilar consolidation most suggestive of mild to moderate interstitial and alveolar pulmonary edema. Small bilateral pleural effusions are present. Together, the findings suggest changes of mild cardiogenic failure. No pneumothorax. Central airways are widely patent. Upper Abdomen: No acute abnormality. Musculoskeletal: No chest wall abnormality. No acute or significant osseous findings. Review of the MIP images confirms the above findings. IMPRESSION: 1. No pulmonary embolism. 2. Mild to moderate interstitial and alveolar pulmonary edema with small bilateral pleural effusions. Together, the findings suggest changes of mild cardiogenic failure. 3. 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 This appears similar to sonogram of 07/23/2011 where biopsy of an exophytic left lower polar thyroid nodule was recommended. Electronically Signed   By: Helyn Numbers M.D.   On: 08/22/2021 23:08   DG Chest 2 View  Result Date: 08/22/2021 CLINICAL DATA:  Chest pain EXAM: CHEST - 2 VIEW COMPARISON:  Previous studies including the examination done earlier today FINDINGS: Transverse diameter of heart is increased. Central pulmonary vessels are more prominent. There is interval increase in interstitial markings in the both parahilar regions and both lower lung fields. There is blunting of both lateral CP angles, more so on the left side. There is no pneumothorax. There is mild extrinsic pressure over the left lateral margin of trachea in the lower neck suggesting enlarged thyroid. IMPRESSION: Cardiomegaly. There is interval worsening of pulmonary vascular congestion. There is interval increase in interstitial markings in the parahilar regions and lower lung  fields suggesting interstitial pulmonary edema. Small bilateral pleural effusions, more so on the left side. Electronically Signed   By: Ernie Avena M.D.   On: 08/22/2021 18:40   DG Chest 2 View  Result Date: 08/22/2021 CLINICAL DATA:  SOB EXAM: CHEST - 2 VIEW COMPARISON:  Chest radiograph Jul 11, 2015. FINDINGS: Low lung volumes. Question mild bibasilar interstitial opacities. No confluent consolidation. No visible pleural effusions or pneumothorax. Cardiomediastinal silhouette is within normal limits. No displaced fracture. IMPRESSION: Question mild bibasilar interstitial opacities, which could represent atelectasis, mild interstitial edema, or atypical infection. No confluent consolidation. Electronically Signed   By: Feliberto Harts M.D.   On: 08/22/2021 11:57     Discharge Exam: Blood pressure (!) 143/81, pulse 89, temperature 98.2 F (36.8 C), temperature source Oral, resp. rate 18, height 5\' 1"  (1.549 m), weight 112.7 kg, SpO2 95 %, currently breastfeeding. General appearance: alert, oriented, NAD Lungs: normal respiratory effort Heart: regular rate Abdomen: soft, non-tender Extremities: No calf swelling or tenderness  Discharge disposition: 01-Home or Self Care   Allergies as of 08/24/2021       Reactions   Shellfish Allergy Anaphylaxis, Shortness Of Breath, Swelling, Other (See Comments)   Numbness/tingling.  Swelling of mouth/tounge, moving towards throat. Took Benadryl 50 mg before swelling affected breathing.   Latex Itching, Rash        Medication List     TAKE these medications    acetaminophen 500 MG tablet Commonly known as: TYLENOL Take 500 mg by mouth as needed (pain). What changed: Another medication with the same name was removed. Continue taking this medication, and follow the directions you see here.   aspirin EC 81 MG tablet Take 81 mg by mouth daily. Swallow whole.   cetirizine 10 MG tablet Commonly known as: ZYRTEC Take 10 mg by mouth at  bedtime.   CLEAR EYES OP Apply 1  drop to eye daily as needed (dry eyes).   fluticasone 50 MCG/ACT nasal spray Commonly known as: FLONASE Place 2 sprays into both nostrils daily. What changed:  when to take this reasons to take this   furosemide 20 MG tablet Commonly known as: Lasix Take 1 tablet (20 mg total) by mouth 2 (two) times daily for 5 days.   ibuprofen 600 MG tablet Commonly known as: ADVIL Take 1 tablet (600 mg total) by mouth every 6 (six) hours as needed. What changed: Another medication with the same name was removed. Continue taking this medication, and follow the directions you see here.   montelukast 10 MG tablet Commonly known as: SINGULAIR Take 10 mg by mouth daily.   NIFEdipine 30 MG 24 hr tablet Commonly known as: ADALAT CC Take 1 tablet (30 mg total) by mouth daily. Start taking on: August 25, 2021   norethindrone 0.35 MG tablet Commonly known as: Ortho Micronor Take 1 tablet (0.35 mg total) by mouth daily.   Prenatal Vitamin 27-0.8 MG Tabs Take 1 tablet by mouth daily. What changed: Another medication with the same name was removed. Continue taking this medication, and follow the directions you see here.   TUMS PO Take 2 tablets by mouth See admin instructions. 2 tablets by mouth 2-3 times a day as needed for acid reflux        Discharge time: 30 minutes   Signed: Jaynie Collins, MD 08/24/2021, 3:05 PM
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
## 2021-08-27 ENCOUNTER — Telehealth (HOSPITAL_COMMUNITY): Payer: Self-pay

## 2021-08-27 NOTE — Telephone Encounter (Signed)
"  I'm doing better, everything is ok." Patient declines questions or concerns about her health and healing.  "He's good, eats a lot. We have a weight check appointment this week. He sleeps in a pack n play." RN reviewed ABC's of safe sleep with patient. Patient declines any questions or concerns about baby.  EPDS score is 6.  Marcelino Duster [facility] 06/19//2023,1443

## 2021-08-28 NOTE — Progress Notes (Signed)
Appointment cancelled

## 2021-08-31 ENCOUNTER — Other Ambulatory Visit: Payer: Self-pay

## 2021-08-31 ENCOUNTER — Ambulatory Visit (INDEPENDENT_AMBULATORY_CARE_PROVIDER_SITE_OTHER): Payer: Medicaid Other

## 2021-08-31 VITALS — BP 122/68 | HR 95 | Wt 241.8 lb

## 2021-08-31 DIAGNOSIS — Z0130 Encounter for examination of blood pressure without abnormal findings: Secondary | ICD-10-CM

## 2021-09-02 ENCOUNTER — Encounter: Payer: Self-pay | Admitting: Obstetrics & Gynecology

## 2021-09-04 ENCOUNTER — Encounter: Payer: Self-pay | Admitting: Family Medicine

## 2021-09-04 ENCOUNTER — Encounter: Payer: Self-pay | Admitting: *Deleted

## 2021-09-04 IMAGING — CT CT ABD-PELV W/ CM
3 of 4 series · 11 of 46 positions shown, 18 images · IV contrast (omnipaque)
Comparison: 03/31/2015

CLINICAL DATA: Acute nonlocalized abdominal pain. Right upper
quadrant to umbilical pain for a few days.

EXAM:
CT ABDOMEN AND PELVIS WITH CONTRAST
TECHNIQUE: Multidetector CT imaging of the abdomen and pelvis was performed
using the standard protocol following bolus administration of
intravenous contrast.
CONTRAST:  100mL OMNIPAQUE IOHEXOL 300 MG/ML  SOLN

[Series 3: abdomen 5.0 · axial · 0.89mm/px · z∈[-450,-105]mm · 7 of 93 slices shown, 12 images]
[im 12/93  soft-tissue]
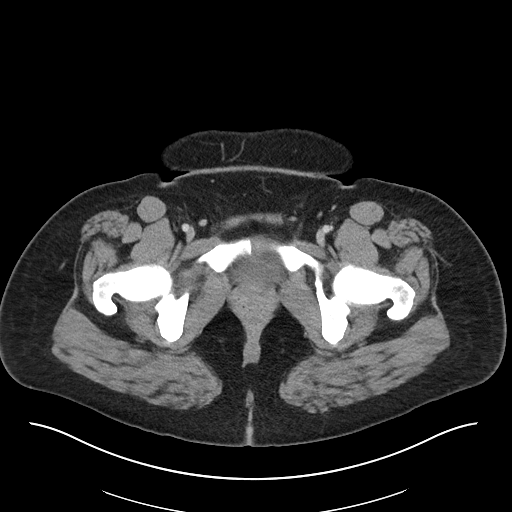
[im 12/93  bone]
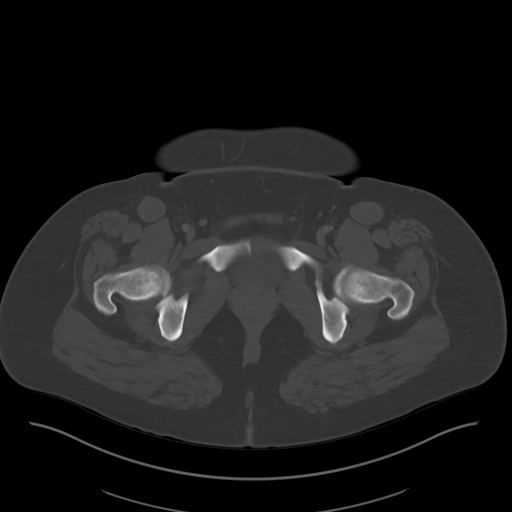
[im 24/93  soft-tissue]
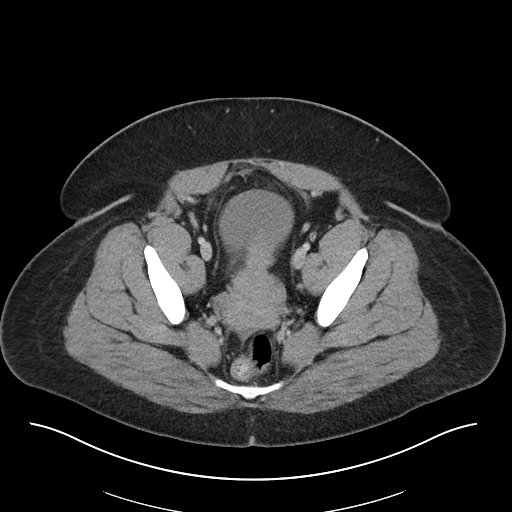
[im 35/93  soft-tissue]
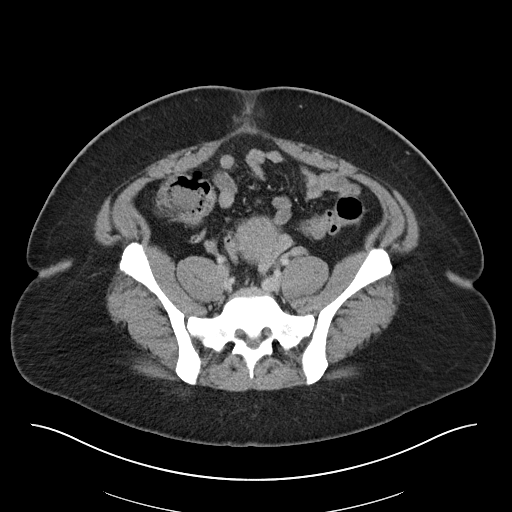
[im 47/93  soft-tissue]
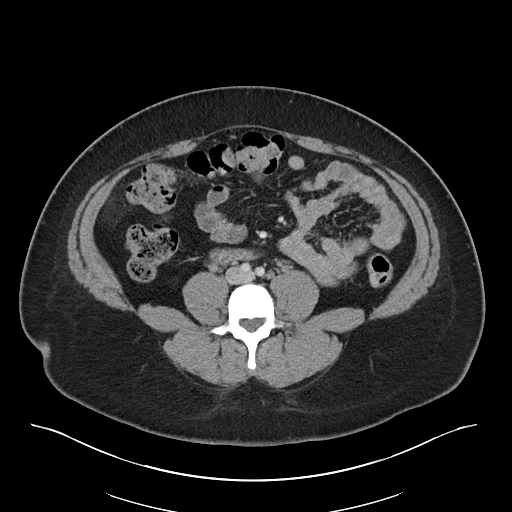
[im 47/93  lung]
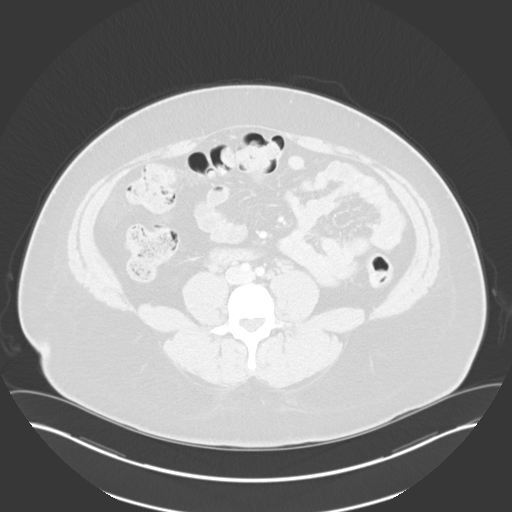
[im 58/93  soft-tissue]
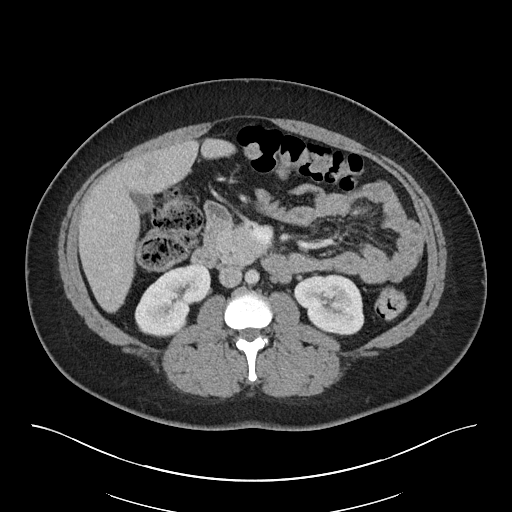
[im 58/93  lung]
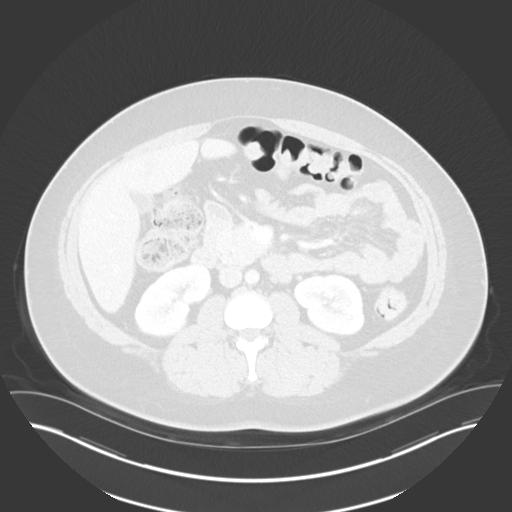
[im 70/93  soft-tissue]
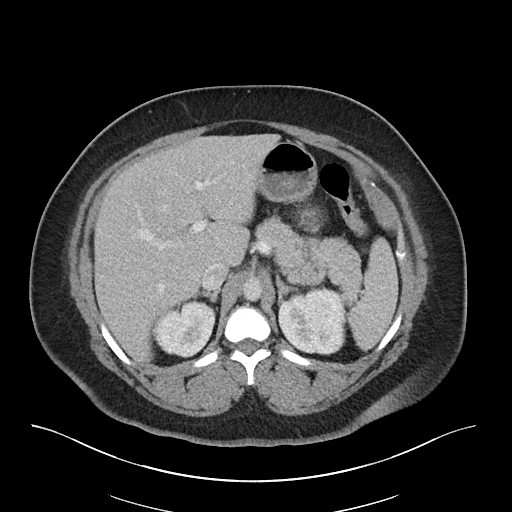
[im 70/93  lung]
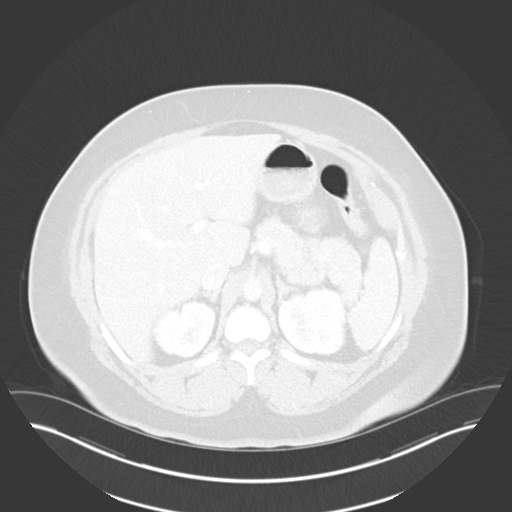
[im 81/93  soft-tissue]
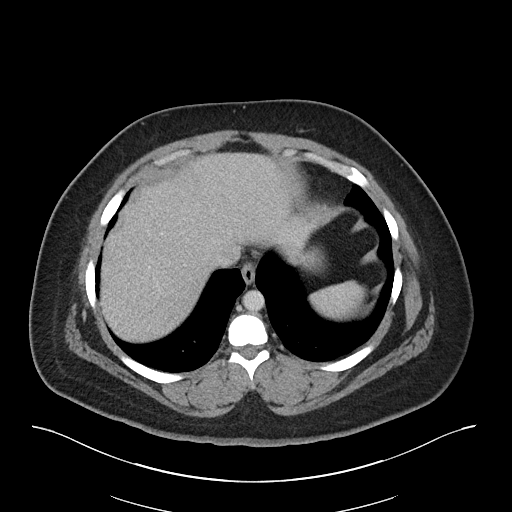
[im 81/93  lung]
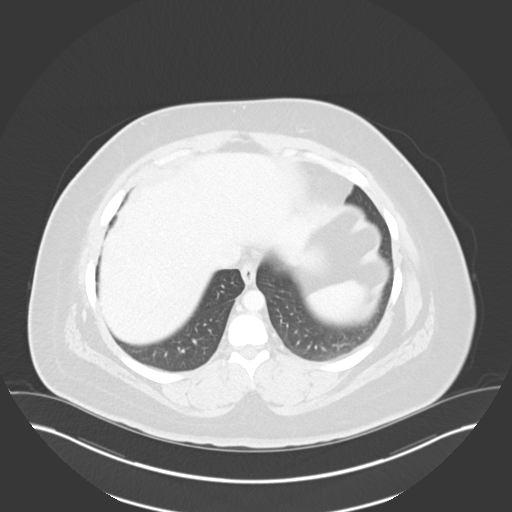

[Series 6: abdomen 3.0 mpr cor · coronal · 0.90mm/px · 3 of 112 slices shown, 4 images]
[im 38/112  soft-tissue]
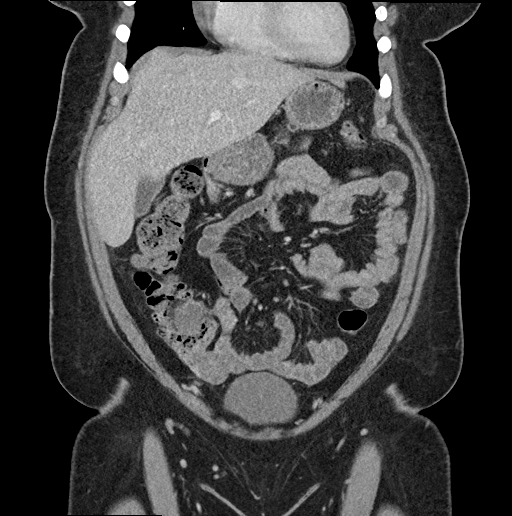
[im 50/112  soft-tissue]
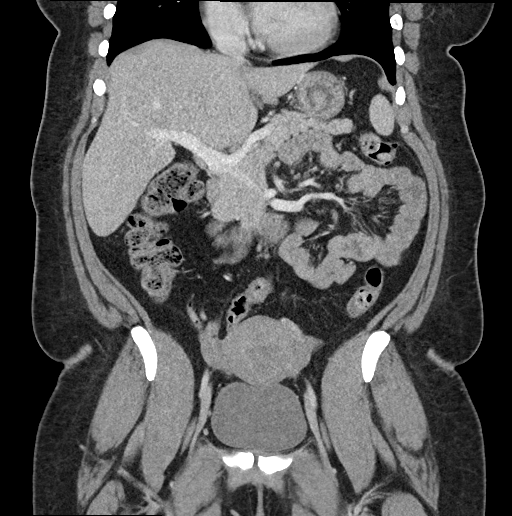
[im 50/112  bone]
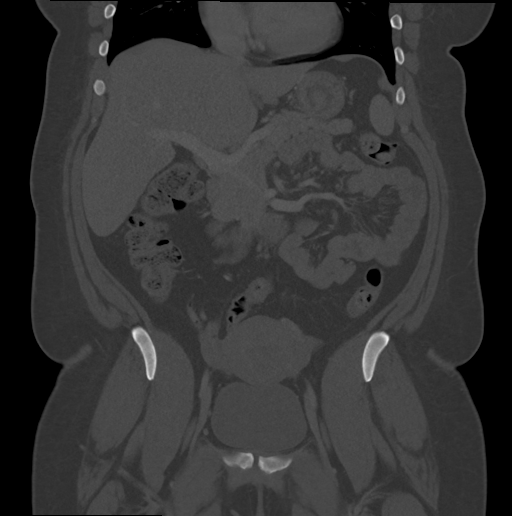
[im 62/112  soft-tissue]
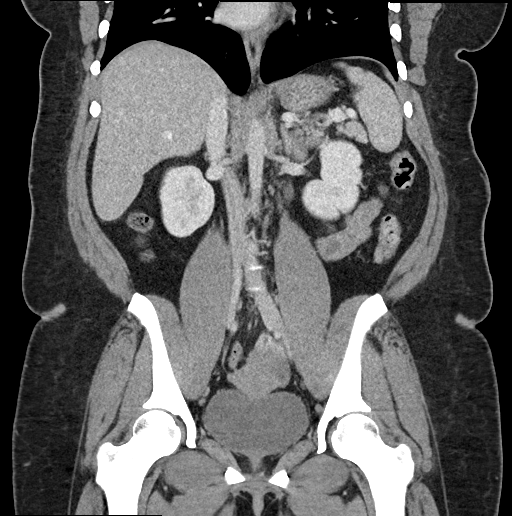

[Series 7: abdomen 3.0 mpr sag · sagittal · 0.65mm/px · 1 of 153 slices shown, 2 images]
[im 51/153  soft-tissue]
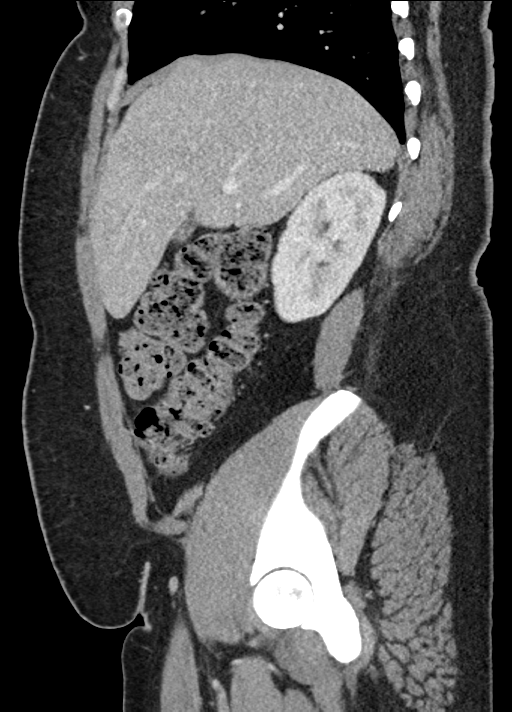
[im 51/153  bone]
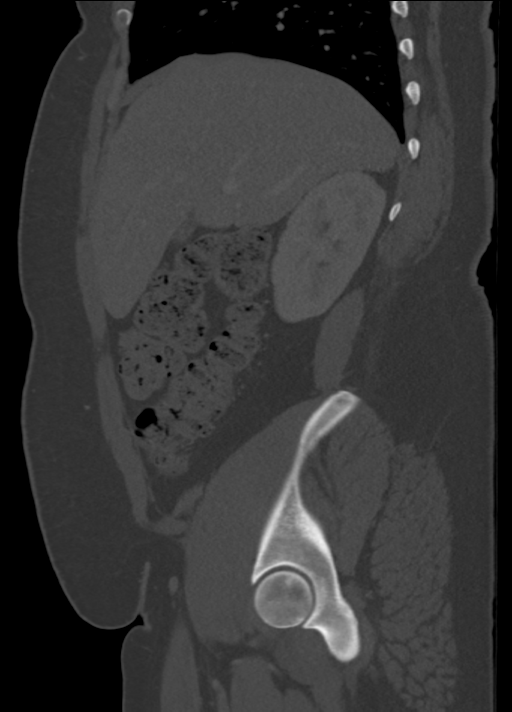

[11 of 46 positions shown; findings below may reference images not displayed]

FINDINGS: Lower chest:  No contributory findings.

Hepatobiliary: Tiny low-density in the right liver that is too small
for characterization. Vague ill-defined low-density in segment 4B of
the liver without discrete mass or mass effect. No evidence of
biliary obstruction or stone.

Pancreas: No acute finding. The pancreas completely encircles the
main portal vein.

Spleen: Unremarkable.

Adrenals/Urinary Tract: Negative adrenals. No hydronephrosis or
stone. Unremarkable bladder.

Stomach/Bowel:  No obstruction. No appendicitis.

Vascular/Lymphatic: No acute vascular abnormality. No mass or
adenopathy.

Reproductive:3.4 cm left ovarian cyst, usually physiologic at this
size and patient age.

Other: No ascites or pneumoperitoneum.

Musculoskeletal: No acute abnormalities.
IMPRESSION: Indeterminate ill-defined low-density in the lower ventral liver.
The appearance is not typical of mass or fatty infiltration,
question recent right upper quadrant trauma. If no clinical
explanation an outpatient liver MRI could be obtained.

## 2021-09-14 ENCOUNTER — Ambulatory Visit
Admission: RE | Admit: 2021-09-14 | Discharge: 2021-09-14 | Disposition: A | Discharge status: Home / Self Care | Payer: Medicaid Other | Source: Ambulatory Visit | Attending: Internal Medicine | Admitting: Internal Medicine

## 2021-09-14 VITALS — BP 108/80 | HR 98 | Temp 98.0°F | Resp 18

## 2021-09-14 DIAGNOSIS — J029 Acute pharyngitis, unspecified: Secondary | ICD-10-CM | POA: Diagnosis not present
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
## 2021-09-14 NOTE — ED Triage Notes (Addendum)
Pt c/o cough, sore throat, nasal drainage, onset a few days ago.   Also c/o left upper extremity discomfort causing limited ROM. Denies trauma or injury to left shoulder.

## 2021-09-14 NOTE — Discharge Instructions (Signed)
Warm salt water gargle Humidifier and vapor rub use will help with nasal congestion and sore throat Please continue taking your medications Maintain adequate hydration Return to urgent care if symptoms worsen.

## 2021-09-16 ENCOUNTER — Ambulatory Visit
Admission: RE | Admit: 2021-09-16 | Discharge: 2021-09-16 | Disposition: A | Discharge status: Home / Self Care | Payer: Medicaid Other | Source: Ambulatory Visit | Attending: Physician Assistant | Admitting: Physician Assistant

## 2021-09-16 VITALS — BP 118/77 | HR 84 | Temp 98.4°F | Resp 17

## 2021-09-16 DIAGNOSIS — J029 Acute pharyngitis, unspecified: Secondary | ICD-10-CM | POA: Diagnosis present

## 2021-09-16 LAB — POCT RAPID STREP A (OFFICE): Rapid Strep A Screen: NEGATIVE

## 2021-09-16 NOTE — Discharge Instructions (Signed)
Return f any problems.  Tylenol for discomfort

## 2021-09-16 NOTE — ED Triage Notes (Signed)
Pt presents with ongoing sore throat X 1 week that is progressing.
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
## 2021-09-20 LAB — CULTURE, GROUP A STREP (THRC)

## 2021-09-21 ENCOUNTER — Ambulatory Visit
Admission: RE | Admit: 2021-09-21 | Discharge: 2021-09-21 | Disposition: A | Discharge status: Home / Self Care | Payer: Medicaid Other | Source: Ambulatory Visit | Attending: Student | Admitting: Student

## 2021-09-21 VITALS — BP 131/83 | HR 90 | Temp 98.0°F | Resp 18

## 2021-09-21 DIAGNOSIS — Z973 Presence of spectacles and contact lenses: Secondary | ICD-10-CM

## 2021-09-21 DIAGNOSIS — H109 Unspecified conjunctivitis: Secondary | ICD-10-CM | POA: Diagnosis not present

## 2021-09-21 MED ORDER — CIPROFLOXACIN HCL 0.3 % OP SOLN: 1.0000 [drp] | OPHTHALMIC | 0 refills | Status: AC
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
## 2021-09-21 NOTE — ED Triage Notes (Signed)
Pt c/o bilat conjunctivitis onset yesterday

## 2021-09-21 NOTE — Discharge Instructions (Addendum)
-  You have bacterial conjunctivitis (pinkeye). -Start the antibiotic drops, ciprofloxacin 1 drop into the affected eye/s every four hours while awake for 7 days. -You can also try warm compresses or washing the face with gentle soap and water to remove crust in the morning. -Your symptoms should be a lot better in about 1 day, and you'll also no longer be contagious after 1 day on the antibiotic. -If your symptoms are getting worse instead of better, like pain, discharge, itching, vision changes-follow-up with your eye doctor as soon as possible. -Do not wear contact lenses until you have completed treatment (7 days!). Wear glasses only.  -Throw away and replace any eye or face makeup you used while infected. -Try taking 1 pill of benedryl (25mg ) at bedtime for the next few days. This will make you sleepy.

## 2021-09-30 ENCOUNTER — Other Ambulatory Visit: Payer: Self-pay | Admitting: Obstetrics & Gynecology

## 2021-09-30 MED ORDER — NIFEDIPINE ER 30 MG PO TB24
30.0000 mg | ORAL_TABLET | Freq: Every day | ORAL | 0 refills | Status: DC
Start: 2021-09-30 — End: 2021-11-29
  Filled 2021-09-30: qty 30, 30d supply, fill #0

## 2021-10-01 ENCOUNTER — Encounter: Payer: Self-pay | Admitting: Obstetrics and Gynecology

## 2021-10-01 ENCOUNTER — Telehealth (INDEPENDENT_AMBULATORY_CARE_PROVIDER_SITE_OTHER): Payer: Medicaid Other | Admitting: Obstetrics and Gynecology

## 2021-10-01 ENCOUNTER — Other Ambulatory Visit: Payer: Self-pay

## 2021-10-01 NOTE — Progress Notes (Signed)
Provider location: [facility]   Patient location: Home  I connected [email or web address] on 10/01/21 at 10:35 AM EDT by Mychart Video Encounter and verified that I am speaking with the correct person using two identifiers.       I discussed the limitations, risks, security and privacy concerns of performing an evaluation and management service virtually and the availability of in person appointments. I also discussed with the patient that there may be a patient responsible charge related to this service. The patient expressed understanding and agreed to proceed.  Post Partum Visit Note Subjective:   Amanda Church is a 36 y.o. [phone] female who presents for a postpartum visit. She is 6 weeks postpartum following a normal spontaneous vaginal delivery.  I have fully reviewed the prenatal and intrapartum course. The delivery was at 39/6 gestational weeks.  Anesthesia: epidural. Postpartum course has been complicated by severe postpartum preeclampsia and pulmonary edema. Baby is doing well. Baby is feeding by breast. Bleeding no bleeding. Bowel function is normal. Bladder function is normal. Patient is not sexually active. Contraception method is Pharmacist, hospital. Postpartum depression screening: negative.   The pregnancy intention screening data noted above was reviewed. Potential methods of contraception were discussed. The patient elected to proceed with No data recorded.   Edinburgh Postnatal Depression Scale - 10/01/21 1054       Edinburgh Postnatal Depression Scale:  In the Past 7 Days   I have been able to laugh and see the funny side of things. 0    I have looked forward with enjoyment to things. 0    I have blamed myself unnecessarily when things went wrong. 0    I have been anxious or worried for no good reason. 1    I have felt scared or panicky for no good reason. 0    Things have been getting on top of me. 0    I have been so unhappy that I have had  difficulty sleeping. 0    I have felt sad or miserable. 0    I have been so unhappy that I have been crying. 0    The thought of harming myself has occurred to me. 0    Edinburgh Postnatal Depression Scale Total 1             The following portions of the patient's history were reviewed and updated as appropriate: allergies, current medications, past family history, past medical history, past social history, past surgical history, and problem list.  Review of Systems Pertinent items are noted in HPI.  Objective:  BP (!) 156/85   Pulse 77   Ht 5' 1.5" (1.562 m)   Breastfeeding Yes   BMI 44.95 kg/m     General:  Alert, oriented and cooperative. Patient is in no acute distress.  Respiratory: Normal respiratory effort, no problems with respiration noted  Mental Status: Normal mood and affect. Normal behavior. Normal judgment and thought content.  Rest of physical exam deferred due to type of encounter   Assessment:   encounter postpartum exam.  Plan:  Essential components of care per ACOG recommendations:  1.  Mood and well being: Patient with negative depression screening today. Reviewed local resources for support.  - Patient does not use tobacco.  - hx of drug use? No     2. Infant care and feeding:  -Patient currently breastmilk feeding? Yes If breastmilk feeding discussed return to work and pumping. If needed, patient was  provided letter for work to allow for every 2-3 hr pumping breaks, and to be granted a private location to express breastmilk and refrigerated area to store breastmilk. Reviewed importance of draining breast regularly to support lactation. -Social determinants of health (SDOH) reviewed in EPIC.   3. Sexuality, contraception and birth spacing - Patient does not want a pregnancy in the next year.  Desired family size is 7 children.  - Reviewed forms of contraception in tiered fashion. Patient desired oral progesterone-only contraceptive today.   -  Discussed birth spacing of 18 months  4. Sleep and fatigue -Encouraged family/partner/community support of 4 hrs of uninterrupted sleep to help with mood and fatigue  5. Physical Recovery  - Discussed patients delivery and complications - Patient had a  degree laceration, perineal healing reviewed. Patient expressed understanding - Patient has urinary incontinence? No - Patient is safe to resume physical and sexual activity  6.  Health Maintenance - Last pap smear done 06/08/20 and was abnormal with    with positive HPV. 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 Chronic Disease Hypertension still present Pt advised to follow up with PCP regarding continued HTN.  - PCP follow up   I provided 10 minutes of face-to-face time during this encounter.    Return in about 3 weeks (around 10/22/2021) for surgical consult and pap.   Warden Fillers, MD [facility] for [employer], [facility]

## 2021-10-14 ENCOUNTER — Encounter: Payer: Self-pay | Admitting: Obstetrics & Gynecology

## 2021-10-16 ENCOUNTER — Other Ambulatory Visit: Payer: Self-pay

## 2021-11-29 ENCOUNTER — Ambulatory Visit (HOSPITAL_COMMUNITY)
Admission: RE | Admit: 2021-11-29 | Discharge: 2021-11-29 | Disposition: A | Discharge status: Home / Self Care | Payer: Medicaid Other | Source: Ambulatory Visit | Attending: Urgent Care | Admitting: Urgent Care

## 2021-11-29 ENCOUNTER — Encounter (HOSPITAL_COMMUNITY): Payer: Self-pay

## 2021-11-29 VITALS — BP 134/65 | HR 79 | Temp 98.3°F | Resp 18

## 2021-11-29 DIAGNOSIS — M7918 Myalgia, other site: Secondary | ICD-10-CM | POA: Diagnosis not present

## 2021-11-29 LAB — POC URINE PREG, ED: Preg Test, Ur: NEGATIVE

## 2021-11-29 MED ORDER — IBUPROFEN 600 MG PO TABS: 600.0000 mg | ORAL_TABLET | Freq: Three times a day (TID) | ORAL | 0 refills | Status: DC | PRN

## 2021-11-29 MED ORDER — CYCLOBENZAPRINE HCL 5 MG PO TABS
5.0000 mg | ORAL_TABLET | Freq: Three times a day (TID) | ORAL | 0 refills | Status: DC | PRN
Start: 2021-11-29 — End: 2023-01-23
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
## 2021-11-29 NOTE — ED Triage Notes (Signed)
Pt reports left arm pain that started last night. Pt reports carrying her son car seat with the left arm.

## 2021-11-29 NOTE — Discharge Instructions (Addendum)
Your pain appears to be due to a very large tender knot in your left trapezius muscle. Start with moist heat/warm compresses.  Have someone perform massage at home. Take the anti-inflammatory and muscle laxer as prescribed.  Keep in mind that muscle relaxers may make you tired or sedated. The medication can be present in small quantities in breast milk, so use for as short a time as possible. If this does not relieve your pain, consider follow-up with physical therapy for dry needling. You may also follow-up with your PCP for a possible trigger point injection.

## 2021-12-10 ENCOUNTER — Ambulatory Visit (HOSPITAL_COMMUNITY): Payer: Medicaid Other

## 2022-01-02 ENCOUNTER — Emergency Department (HOSPITAL_BASED_OUTPATIENT_CLINIC_OR_DEPARTMENT_OTHER): Payer: Medicaid Other

## 2022-01-02 ENCOUNTER — Ambulatory Visit
Admission: EM | Admit: 2022-01-02 | Discharge: 2022-01-02 | Disposition: A | Discharge status: Home / Self Care | Payer: Medicaid Other | Attending: Nurse Practitioner | Admitting: Nurse Practitioner

## 2022-01-02 ENCOUNTER — Encounter (HOSPITAL_BASED_OUTPATIENT_CLINIC_OR_DEPARTMENT_OTHER): Payer: Self-pay

## 2022-01-02 ENCOUNTER — Emergency Department (HOSPITAL_BASED_OUTPATIENT_CLINIC_OR_DEPARTMENT_OTHER)
Admission: EM | Admit: 2022-01-02 | Discharge: 2022-01-02 | Disposition: A | Discharge status: Home / Self Care | Payer: Medicaid Other | Attending: Emergency Medicine | Admitting: Emergency Medicine

## 2022-01-02 ENCOUNTER — Other Ambulatory Visit: Payer: Self-pay

## 2022-01-02 ENCOUNTER — Inpatient Hospital Stay (EMERGENCY_DEPARTMENT_HOSPITAL)
Admission: AD | Admit: 2022-01-02 | Discharge: 2022-01-02 | Discharge status: Against Medical Advice | Payer: Medicaid Other | Source: Home / Self Care | Admitting: Obstetrics & Gynecology

## 2022-01-02 VITALS — BP 123/65 | HR 116 | Temp 100.2°F | Resp 20

## 2022-01-02 DIAGNOSIS — Z91040 Latex allergy status: Secondary | ICD-10-CM | POA: Insufficient documentation

## 2022-01-02 DIAGNOSIS — N739 Female pelvic inflammatory disease, unspecified: Secondary | ICD-10-CM | POA: Diagnosis not present

## 2022-01-02 DIAGNOSIS — R509 Fever, unspecified: Secondary | ICD-10-CM | POA: Diagnosis present

## 2022-01-02 DIAGNOSIS — Z3202 Encounter for pregnancy test, result negative: Secondary | ICD-10-CM | POA: Insufficient documentation

## 2022-01-02 DIAGNOSIS — R000 Tachycardia, unspecified: Secondary | ICD-10-CM | POA: Diagnosis not present

## 2022-01-02 DIAGNOSIS — Z1152 Encounter for screening for COVID-19: Secondary | ICD-10-CM | POA: Insufficient documentation

## 2022-01-02 DIAGNOSIS — R110 Nausea: Secondary | ICD-10-CM | POA: Diagnosis not present

## 2022-01-02 DIAGNOSIS — R109 Unspecified abdominal pain: Secondary | ICD-10-CM | POA: Insufficient documentation

## 2022-01-02 LAB — URINALYSIS, ROUTINE W REFLEX MICROSCOPIC
Bacteria, UA: NONE SEEN
Bilirubin Urine: NEGATIVE
Glucose, UA: NEGATIVE mg/dL
Ketones, ur: NEGATIVE mg/dL
Leukocytes,Ua: NEGATIVE
Nitrite: NEGATIVE
Protein, ur: 30 mg/dL — AB
Specific Gravity, Urine: 1.027 (ref 1.005–1.030)
pH: 5 (ref 5.0–8.0)

## 2022-01-02 LAB — CBC
HCT: 34.3 % — ABNORMAL LOW (ref 36.0–46.0)
Hemoglobin: 10.5 g/dL — ABNORMAL LOW (ref 12.0–15.0)
MCH: 24.1 pg — ABNORMAL LOW (ref 26.0–34.0)
MCHC: 30.6 g/dL (ref 30.0–36.0)
MCV: 78.7 fL — ABNORMAL LOW (ref 80.0–100.0)
Platelets: 275 10*3/uL (ref 150–400)
RBC: 4.36 MIL/uL (ref 3.87–5.11)
RDW: 16.4 % — ABNORMAL HIGH (ref 11.5–15.5)
WBC: 5 10*3/uL (ref 4.0–10.5)
nRBC: 0 % (ref 0.0–0.2)

## 2022-01-02 LAB — COMPREHENSIVE METABOLIC PANEL
ALT: 25 U/L (ref 0–44)
AST: 22 U/L (ref 15–41)
Albumin: 3.7 g/dL (ref 3.5–5.0)
Alkaline Phosphatase: 68 U/L (ref 38–126)
Anion gap: 9 (ref 5–15)
BUN: 10 mg/dL (ref 6–20)
CO2: 23 mmol/L (ref 22–32)
Calcium: 9 mg/dL (ref 8.9–10.3)
Chloride: 107 mmol/L (ref 98–111)
Creatinine, Ser: 0.68 mg/dL (ref 0.44–1.00)
GFR, Estimated: >60 mL/min (ref >60)
Glucose, Bld: 159 mg/dL — ABNORMAL HIGH (ref 70–99)
Potassium: 3.8 mmol/L (ref 3.5–5.1)
Sodium: 139 mmol/L (ref 135–145)
Total Bilirubin: 0.3 mg/dL (ref 0.3–1.2)
Total Protein: 6.9 g/dL (ref 6.5–8.1)

## 2022-01-02 LAB — RESP PANEL BY RT-PCR (FLU A&B, COVID) ARPGX2
Influenza A by PCR: NEGATIVE
Influenza B by PCR: NEGATIVE
SARS Coronavirus 2 by RT PCR: NEGATIVE

## 2022-01-02 LAB — I-STAT BETA HCG BLOOD, ED (MC, WL, AP ONLY): I-stat hCG, quantitative: <5 m[IU]/mL (ref <5)

## 2022-01-02 LAB — WET PREP, GENITAL
Clue Cells Wet Prep HPF POC: NONE SEEN
Sperm: NONE SEEN
Trich, Wet Prep: NONE SEEN
WBC, Wet Prep HPF POC: <10 (ref <10)
Yeast Wet Prep HPF POC: NONE SEEN

## 2022-01-02 LAB — LIPASE, BLOOD: Lipase: 37 U/L (ref 11–51)

## 2022-01-02 MED ORDER — DOXYCYCLINE HYCLATE 100 MG PO TABS
100.0000 mg | ORAL_TABLET | Freq: Once | ORAL | Status: AC
  Administered 2022-01-02: 100 mg via ORAL
  Filled 2022-01-02: qty 1

## 2022-01-02 MED ORDER — HYDROCODONE-ACETAMINOPHEN 5-325 MG PO TABS: 1.0000 | ORAL_TABLET | ORAL | 0 refills | Status: DC | PRN

## 2022-01-02 MED ORDER — LIDOCAINE HCL (PF) 1 % IJ SOLN
INTRAMUSCULAR | Status: AC
  Filled 2022-01-02: qty 5

## 2022-01-02 MED ORDER — METRONIDAZOLE 500 MG PO TABS: 500.0000 mg | ORAL_TABLET | Freq: Two times a day (BID) | ORAL | 0 refills | Status: DC

## 2022-01-02 MED ORDER — IOHEXOL 300 MG/ML  SOLN
100.0000 mL | Freq: Once | INTRAMUSCULAR | Status: AC | PRN
  Administered 2022-01-02: 100 mL via INTRAVENOUS

## 2022-01-02 MED ORDER — SODIUM CHLORIDE 0.9 % IV BOLUS
1000.0000 mL | Freq: Once | INTRAVENOUS | Status: AC
  Administered 2022-01-02: 1000 mL via INTRAVENOUS

## 2022-01-02 MED ORDER — HYDROMORPHONE HCL 1 MG/ML IJ SOLN
1.0000 mg | Freq: Once | INTRAMUSCULAR | Status: AC
  Administered 2022-01-02: 1 mg via INTRAVENOUS
  Filled 2022-01-02: qty 1

## 2022-01-02 MED ORDER — METRONIDAZOLE 500 MG PO TABS
500.0000 mg | ORAL_TABLET | Freq: Once | ORAL | Status: AC
  Administered 2022-01-02: 500 mg via ORAL
  Filled 2022-01-02: qty 1

## 2022-01-02 MED ORDER — ONDANSETRON HCL 4 MG/2ML IJ SOLN
4.0000 mg | Freq: Once | INTRAMUSCULAR | Status: AC
  Administered 2022-01-02: 4 mg via INTRAVENOUS
  Filled 2022-01-02: qty 2

## 2022-01-02 MED ORDER — CEFTRIAXONE SODIUM 500 MG IJ SOLR
500.0000 mg | Freq: Once | INTRAMUSCULAR | Status: AC
  Administered 2022-01-02: 500 mg via INTRAMUSCULAR
  Filled 2022-01-02: qty 500

## 2022-01-02 MED ORDER — DOXYCYCLINE HYCLATE 100 MG PO CAPS: 100.0000 mg | ORAL_CAPSULE | Freq: Two times a day (BID) | ORAL | 0 refills | Status: AC

## 2022-01-02 NOTE — ED Triage Notes (Signed)
EMS stated, pt went to bathroom and afterwards she felt bad in her stomach the lower part. Her rectum hurts also.

## 2022-01-02 NOTE — ED Provider Notes (Signed)
[facility] EMERGENCY DEPT Provider Note   CSN: [id number] Arrival date & time: 01/02/22  1401     History {Add pertinent medical, surgical, social history, OB history to HPI:1} Chief Complaint  Patient presents with   Fever   Abdominal Pain    Amanda Church is a 36 y.o. female.  HPI 36 year old female presents with abdominal pain.  Started this morning.  She states she had a loose stool earlier in the week but otherwise has not had any significant diarrhea or constipation.  However since this morning she has been having significantly severe lower abdominal pain.  Is worse in the right lower abdomen compared to the left.  She also had vomiting/dry heaving.  Denies any dysuria.  Just came off her menstrual cycle.  She did note some vaginal discharge once.  She is also having lower back pain during this time.  The pain is currently severe.  Went to [facility] where she had some lab work and urinalysis obtained but due to the prolonged wait she left.  She went to urgent care and her temperature was up to 100.2.  She has had prior C-sections and a remote history of a hernia repair when she was a child.  Home Medications Prior to Admission medications   Medication Sig Start Date End Date Taking? Authorizing Provider  cetirizine (ZYRTEC) 10 MG tablet Take 10 mg by mouth at bedtime. 01/15/21   [provider]  cyclobenzaprine (FLEXERIL) 5 MG tablet Take 1 tablet (5 mg total) by mouth 3 (three) times daily as needed for muscle spasms (shoulder pain). 11/29/21   Crain, Whitney L, PA  fluticasone (FLONASE) 50 MCG/ACT nasal spray Place 2 sprays into both nostrils daily. 01/12/21   Melynda Ripple, MD  ibuprofen (ADVIL) 600 MG tablet Take 1 tablet (600 mg total) by mouth every 8 (eight) hours as needed for moderate pain. Take with food. 11/29/21   Crain, Whitney L, PA  montelukast (SINGULAIR) 10 MG tablet Take 10 mg by mouth daily.    [provider]  Naphazoline  HCl (CLEAR EYES OP) Apply 1 drop to eye daily as needed (dry eyes). Patient not taking: Reported on 10/01/2021    [provider]  Prenatal Vit-Fe Fumarate-FA (PRENATAL VITAMIN) 27-0.8 MG TABS Take 1 tablet by mouth daily. 01/31/21   Griffin Basil, MD      Allergies    Shellfish allergy and Latex    Review of Systems   Review of Systems  Constitutional:  Positive for fever (100.2).  Respiratory:  Negative for cough and shortness of breath.   Gastrointestinal:  Positive for abdominal pain and nausea. Negative for constipation and diarrhea.  Genitourinary:  Negative for dysuria.  Musculoskeletal:  Positive for back pain.    Physical Exam Updated Vital Signs BP (!) 149/89 (BP Location: Right Arm)   Pulse (!) 107   Temp 99.7 F (37.6 C)   Resp 16   Ht 5\' 1"  (1.549 m)   Wt 120.2 kg   LMP 12/26/2021   SpO2 98%   BMI 50.07 kg/m  Physical Exam Vitals and nursing note reviewed. Exam conducted with a chaperone present.  Constitutional:      Appearance: She is well-developed. She is obese.  HENT:     Head: Normocephalic and atraumatic.  Cardiovascular:     Rate and Rhythm: Regular rhythm. Tachycardia present.     Heart sounds: Normal heart sounds.  Pulmonary:     Effort: Pulmonary effort is normal.  Abdominal:     Palpations: Abdomen is soft.     Tenderness: There is generalized abdominal tenderness (worst in RLQ).  Genitourinary:    Vagina: Vaginal discharge present.     Comments: Difficult to fully see the cervix.  Difficult to feel on manual exam though she is fairly tender on manual exam Could not palpate ovaries Skin:    General: Skin is warm and dry.  Neurological:     Mental Status: She is alert.     ED Results / Procedures / Treatments   Labs (all labs ordered are listed, but only abnormal results are displayed) Labs Reviewed  RESP PANEL BY RT-PCR (FLU A&B, COVID) ARPGX2    EKG None  Radiology No results found.  Procedures Procedures   {Document cardiac monitor, telemetry assessment procedure when appropriate:1}  Medications Ordered in ED Medications  HYDROmorphone (DILAUDID) injection 1 mg (has no administration in time range)  sodium chloride 0.9 % bolus 1,000 mL (has no administration in time range)  ondansetron (ZOFRAN) injection 4 mg (has no administration in time range)    ED Course/ Medical Decision Making/ A&P                           Medical Decision Making Amount and/or Complexity of Data Reviewed Labs: ordered. Radiology: ordered.  Risk Prescription drug management.   ***  {Document critical care time when appropriate:1} {Document review of labs and clinical decision tools ie heart score, Chads2Vasc2 etc:1}  {Document your independent review of radiology images, and any outside records:1} {Document your discussion with family members, caretakers, and with consultants:1} {Document social determinants of health affecting pt's care:1} {Document your decision making why or why not admission, treatments were needed:1} Final Clinical Impression(s) / ED Diagnoses Final diagnoses:  None    Rx / DC Orders ED Discharge Orders     None

## 2022-01-02 NOTE — ED Notes (Signed)
Patient is being discharged from the Urgent Care and sent to the Emergency Department via POV . Per Oswaldo Conroy NP, patient is in need of higher level of care due to severe abdominal pain. Patient is aware and verbalizes understanding of plan of care.  Vitals:   01/02/22 1300 01/02/22 1302  BP:  123/65  Pulse: (!) 116   Resp: 20   Temp: 100.2 F (37.9 C)   SpO2: 96%
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
## 2022-01-02 NOTE — ED Triage Notes (Addendum)
Pt c/o lower abdominal pain, nausea, and fever that started this morning. Pt went to [facility] and had labs obtained, was unable to stay to be treated.

## 2022-01-02 NOTE — ED Notes (Addendum)
Pt not answering for vital recheck 2x  

## 2022-01-02 NOTE — MAU Provider Note (Signed)
Event Date/Time   First Provider Initiated Contact with Patient 01/02/22 [phone]      S Amanda Church is a 36 y.o. [phone] patient who presents to MAU today with complaint of abdominal pain. Patient presented to ED this morning via EMS for abdominal pain that radiates to her rectum.    O BP (!) 156/67 (BP Location: Right Arm)   Pulse 96   Temp 98.2 F (36.8 C) (Oral)   Resp 18   Ht 5\' 1"  (1.549 m)   Wt 109.3 kg   LMP 12/26/2021   SpO2 98%   BMI 45.54 kg/m  Physical Exam Vitals and nursing note reviewed.  Constitutional:      General: She is in acute distress (tearful).     Appearance: She is well-developed. She is not ill-appearing or toxic-appearing.  Pulmonary:     Effort: Pulmonary effort is normal. No respiratory distress.  Neurological:     Mental Status: She is alert.     A Medical screening exam complete 1. Abdominal pain in female   2. Negative pregnancy test      P Spoke with charge nurse in ED - patient was not transferred to MAU, she left the ED & presented to MAU on her own. Informed patient that she is expected in ED for evaluation as she is not pregnant nor within 6 weeks post partum. Transported back to ED.   Jorje Guild, NP 01/02/2022 8:38 AM
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
## 2022-01-02 NOTE — Discharge Instructions (Addendum)
Your presentation is concerning for pelvic inflammatory disease.  This is treated with antibiotics.  You are also being given pain medicine and you may take ibuprofen and Tylenol in addition to this.  Discussed with the pharmacist before taking any of these medications to see what is okay to take with breast-feeding and whether or not you will need to bottlefeed instead.  If you develop worsening, continued, or recurrent abdominal pain, uncontrolled vomiting, fever, chest or back pain, or any other new/concerning symptoms then return to the ER for evaluation.

## 2022-01-02 NOTE — ED Notes (Signed)
Pt not answering for vital recheck 3x

## 2022-01-02 NOTE — Discharge Instructions (Signed)
Go to the emergency department as soon as you leave urgent care for further evaluation and management. 

## 2022-01-02 NOTE — MAU Note (Signed)
Amanda Church is a 36 y.o. at Unknown here in MAU reporting: lower abdominal and vaginal pain.  States pain started this morning after voiding.  States pain is "strong" and constant. LMP: 12/26/2021 Onset of complaint: today Pain score: 10 Vitals:   01/02/22 0708 01/02/22 0827  BP: (!) 140/71 (!) 156/67  Pulse: 94 96  Resp: 14 18  Temp: 98.4 F (36.9 C) 98.2 F (36.8 C)  SpO2: 95% 98%     FHT:N/A Lab orders placed from triage:   None

## 2022-01-03 LAB — GC/CHLAMYDIA PROBE AMP (~~LOC~~) NOT AT ARMC
Chlamydia: NEGATIVE
Comment: NEGATIVE
Comment: NORMAL
Neisseria Gonorrhea: NEGATIVE
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
## 2022-01-30 ENCOUNTER — Encounter: Payer: Self-pay | Admitting: Obstetrics and Gynecology

## 2022-01-30 ENCOUNTER — Ambulatory Visit (INDEPENDENT_AMBULATORY_CARE_PROVIDER_SITE_OTHER): Payer: Medicaid Other | Admitting: Obstetrics and Gynecology

## 2022-01-30 VITALS — BP 138/84 | HR 96

## 2022-01-30 DIAGNOSIS — N939 Abnormal uterine and vaginal bleeding, unspecified: Secondary | ICD-10-CM | POA: Diagnosis not present

## 2022-01-30 DIAGNOSIS — R87613 High grade squamous intraepithelial lesion on cytologic smear of cervix (HGSIL): Secondary | ICD-10-CM | POA: Diagnosis not present

## 2022-01-30 MED ORDER — MEGESTROL ACETATE 40 MG PO TABS: 40.0000 mg | ORAL_TABLET | Freq: Every day | ORAL | 1 refills | Status: DC

## 2022-01-30 NOTE — Patient Instructions (Signed)
We do laparoscopic bilateral salpingectomy as the sterilization (tubal ligation)

## 2022-02-07 ENCOUNTER — Ambulatory Visit (HOSPITAL_COMMUNITY)
Admission: RE | Admit: 2022-02-07 | Discharge: 2022-02-07 | Disposition: A | Discharge status: Home / Self Care | Payer: Medicaid Other | Source: Ambulatory Visit | Attending: Family Medicine | Admitting: Family Medicine

## 2022-02-07 ENCOUNTER — Encounter (HOSPITAL_COMMUNITY): Payer: Self-pay

## 2022-02-07 VITALS — BP 129/72 | HR 90 | Temp 98.2°F | Resp 20

## 2022-02-07 DIAGNOSIS — H66001 Acute suppurative otitis media without spontaneous rupture of ear drum, right ear: Secondary | ICD-10-CM | POA: Diagnosis not present

## 2022-02-07 DIAGNOSIS — J069 Acute upper respiratory infection, unspecified: Secondary | ICD-10-CM | POA: Diagnosis not present

## 2022-02-07 MED ORDER — AMOXICILLIN 500 MG PO CAPS: 500.0000 mg | ORAL_CAPSULE | Freq: Three times a day (TID) | ORAL | 0 refills | Status: AC

## 2022-02-07 NOTE — Discharge Instructions (Addendum)
You were diagnosed with an ear infection today.  I have sent out an oral antibiotic to take three times/day x 10 days.  I recommend you use over the counter flonase nasal spray for your sinus congestion/drainage.  You may use claritin or zyrtec sparingly for your sinus congestion/drainage as you are breast feeding.

## 2022-02-07 NOTE — ED Triage Notes (Signed)
Pt reports a sore throat, nasal congestion x 1 day, blurry vision at night when driving x 2 days. States eyes feel "gunky". Also reports right ear pain yesterday and none today.
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
## 2022-03-17 ENCOUNTER — Ambulatory Visit: Payer: Medicaid Other

## 2022-03-20 ENCOUNTER — Encounter: Payer: Self-pay | Admitting: Obstetrics and Gynecology

## 2022-03-20 ENCOUNTER — Ambulatory Visit (INDEPENDENT_AMBULATORY_CARE_PROVIDER_SITE_OTHER): Payer: Medicaid Other | Admitting: Obstetrics and Gynecology

## 2022-03-20 ENCOUNTER — Other Ambulatory Visit (HOSPITAL_COMMUNITY)
Admission: RE | Admit: 2022-03-20 | Discharge: 2022-03-20 | Disposition: A | Discharge status: Home / Self Care | Payer: Medicaid Other | Source: Ambulatory Visit | Attending: Obstetrics and Gynecology | Admitting: Obstetrics and Gynecology

## 2022-03-20 VITALS — BP 124/58 | HR 94 | Ht 62.0 in | Wt 265.8 lb

## 2022-03-20 DIAGNOSIS — Z3202 Encounter for pregnancy test, result negative: Secondary | ICD-10-CM | POA: Diagnosis not present

## 2022-03-20 DIAGNOSIS — N939 Abnormal uterine and vaginal bleeding, unspecified: Secondary | ICD-10-CM

## 2022-03-20 DIAGNOSIS — R87613 High grade squamous intraepithelial lesion on cytologic smear of cervix (HGSIL): Secondary | ICD-10-CM | POA: Diagnosis not present

## 2022-03-20 LAB — POCT PREGNANCY, URINE: Preg Test, Ur: NEGATIVE

## 2022-03-20 MED ORDER — ETONOGESTREL-ETHINYL ESTRADIOL 0.12-0.015 MG/24HR VA RING: VAGINAL_RING | VAGINAL | 12 refills | Status: DC

## 2022-03-20 NOTE — Progress Notes (Signed)
    GYNECOLOGY OFFICE PROCEDURE NOTE  HAYDON KALMAR is a 37 y.o. [id number] here for LEEP. No GYN concerns. Pap smear and colposcopy history reviewed.    Re: bleeding Took megace all of November and then start of december noticed having an irregular heart beat and then stopped taking it. Bleeding was heavy but shorter 3-5 days. Felt palpations end of November and then again in December. 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  Role of HPV,cervical dysplasia and need for close followup was empasized. Informed written consent was obtained. All questions were answered. Time out performed. Urine pregnancy test was negative.  ??Procedure: The patient was placed in lithotomy position and the bivalved coated speculum was placed in the patient's vagina. A grounding pad placed on the patient. 1% lidocaien was instilled into th anterior cervical lip. The anterior lip grasped with a tenaculum.  Local anesthesia was administered via an intracervical block using 10 ml of 1% Lidocaine with epinephrine. EMB pipelle passed, twice without difficulty. Acetic acid was applied to the cervix. Additional lidocain was instilled throughout the external os.  The suction was turned on and the Small Fisher Cone Biopsy Excisor on 103 Watts of blended current was used to excise the area of decreased uptake and excise the entire transformation zone. Excellent hemostasis was achieved using roller ball coagulation set at 50 Watts coagulation current. Monsel's solution was then applied and the speculum was removed from the vagina. Specimens were sent to  pathology.  ?The patient tolerated the procedure well. Post-operative instructions given to patient, including instruction to seek medical attention for persistent bright red bleeding, fever, abdominal/pelvic pain, dysuria, nausea or vomiting. She was also told about the possibility of having copious yellow to black tinged discharge for weeks. She was counseled to avoid anything in the vagina (sex/douching/tampons) for 4 weeks. She has a 4 week post-operative check to assess wound healing, review results and discuss further management.   Will additional switch megace to nuvaring to attempt bleeding control. No contraindications to [facility].   Follow up in 4 weeks   Darliss Cheney, MD [place], [facility] for [facility], [place]

## 2022-03-21 ENCOUNTER — Encounter: Payer: Self-pay | Admitting: General Practice

## 2022-03-22 LAB — SURGICAL PATHOLOGY

## 2022-03-27 ENCOUNTER — Telehealth: Payer: Self-pay | Admitting: Obstetrics and Gynecology

## 2022-03-27 NOTE — Telephone Encounter (Signed)
Has noticed an odor but no cramping or pain. Felt a gush of clear fluid - not sure if from vagina or bladder. No fever or chills. Has noted an odor similar to collard greens. No bleeding at this time and no cramping.   Reviewed pathology noted below. Recommend repeat colposcopy with ECC in 6 months to reassess cervical dysplasia.     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 CERVIX, LEEP: - Predominantly low-grade squamous intraepithelial lesion (L SIL/CIN-1). See Comment. - Benign endocervical epithelium  B. ENDOCERVICAL, BIOPSY: - Fragments of benign proliferative endometrium, negative for hyperplasia or malignancy - No endocervical tissue identified   COMMENT: While the predominant pattern is low-grade squamous dysplasia, a single focus shows features worrisome for CIN-2, however evaluation is limited by mechanical artifact.  The specimen was received unoriented and in fragments, precluding definitive margin status assessment.

## 2022-03-30 ENCOUNTER — Ambulatory Visit: Payer: Medicaid Other

## 2022-04-05 ENCOUNTER — Ambulatory Visit: Payer: Medicaid Other

## 2022-04-18 ENCOUNTER — Ambulatory Visit: Payer: Medicaid Other | Admitting: Obstetrics and Gynecology

## 2022-05-03 IMAGING — US US MFM OB COMP +14 WKS
1 series · 13 of 28 positions shown · non-contrast
Comparison: none

[Series 1: us mfm ob comp +14 wks · 13 of 72 slices shown]
[im 3/72]
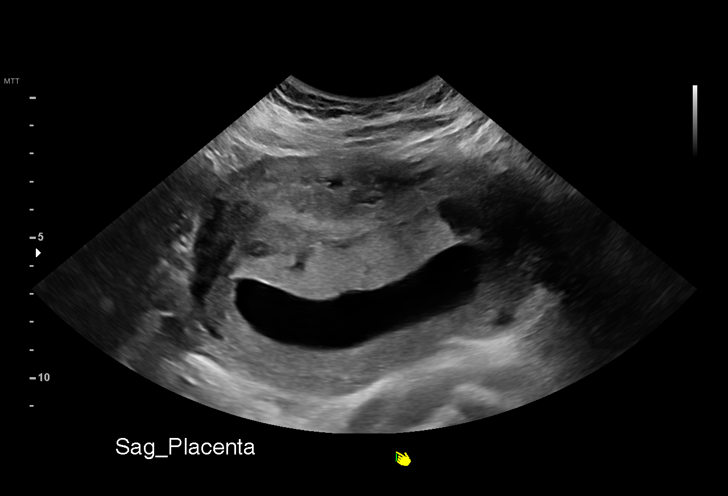
[im 8/72]
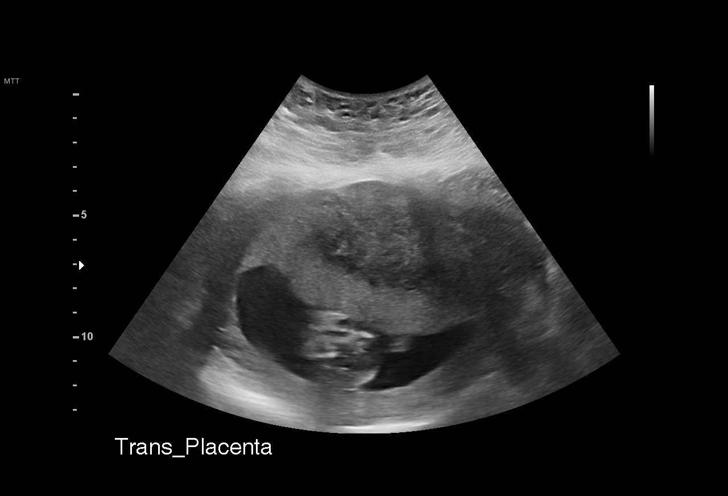
[im 14/72]
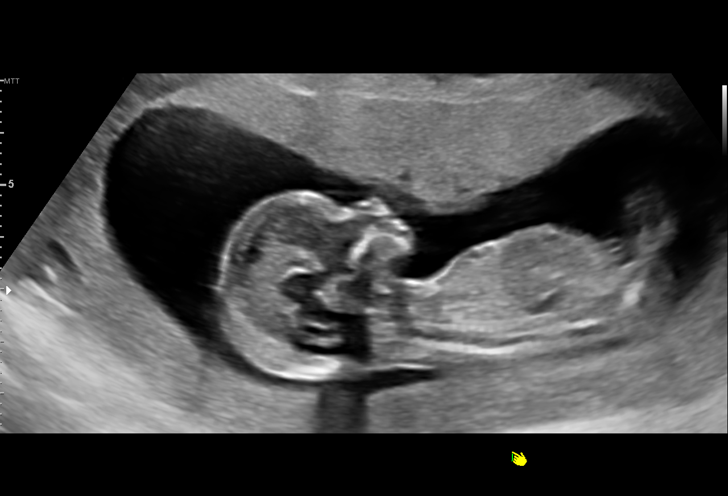
[im 19/72]
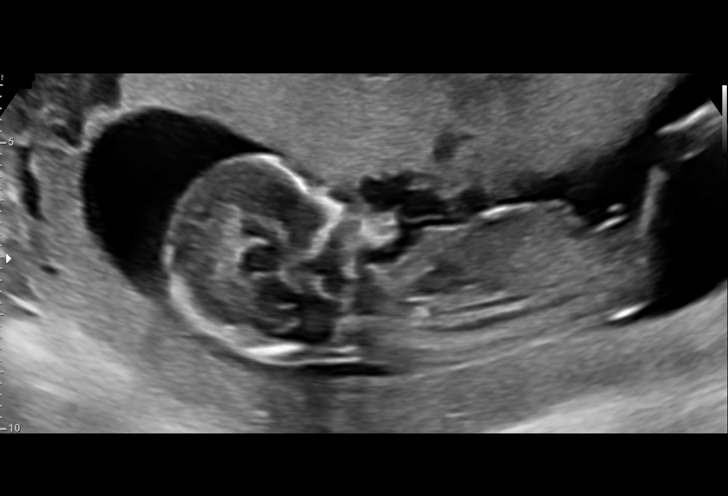
[im 24/72]
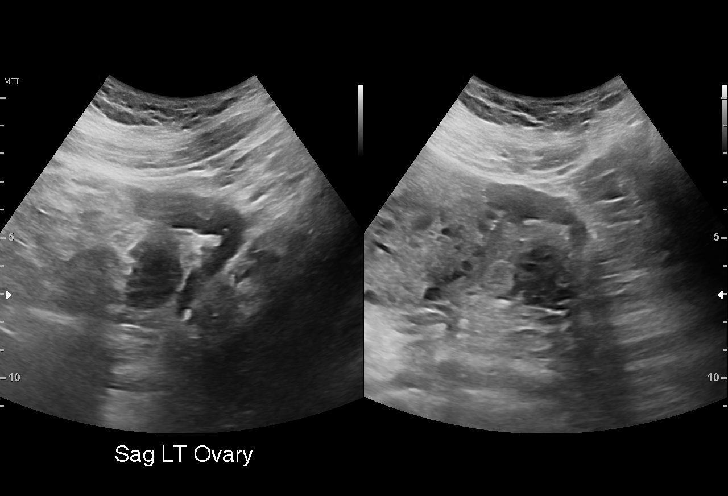
[im 29/72]
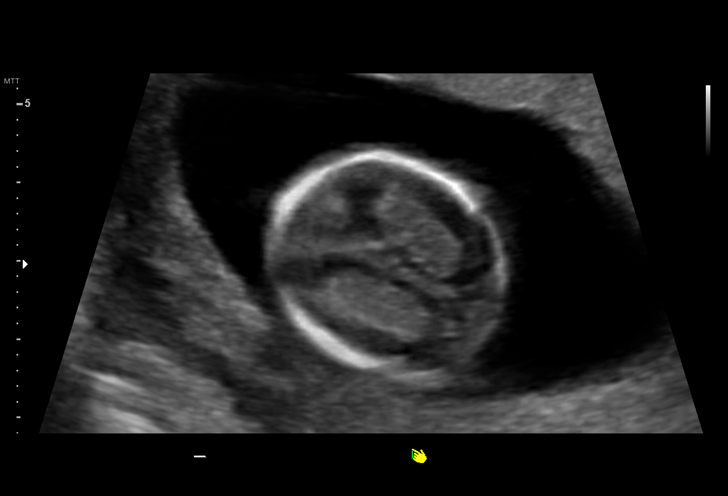
[im 37/72]
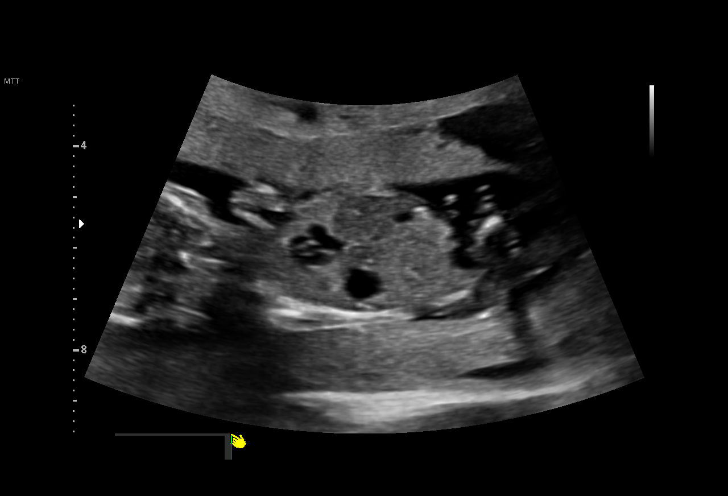
[im 43/72]
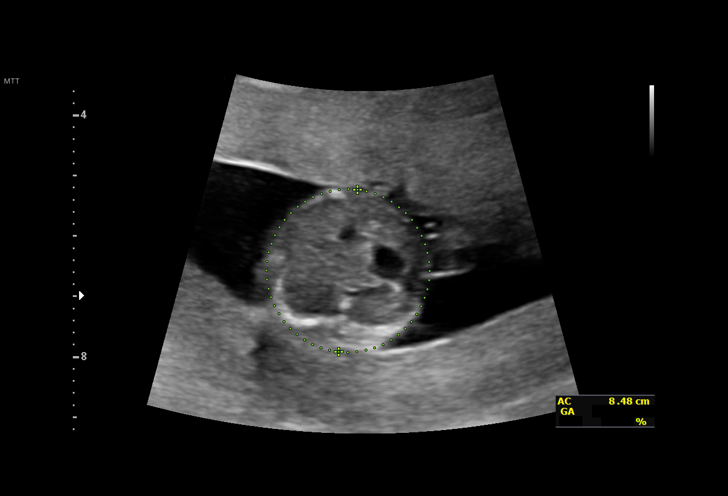
[im 48/72]
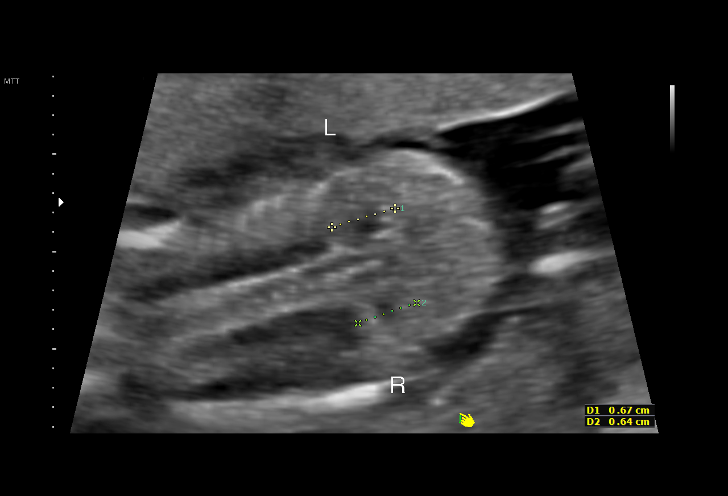
[im 53/72]
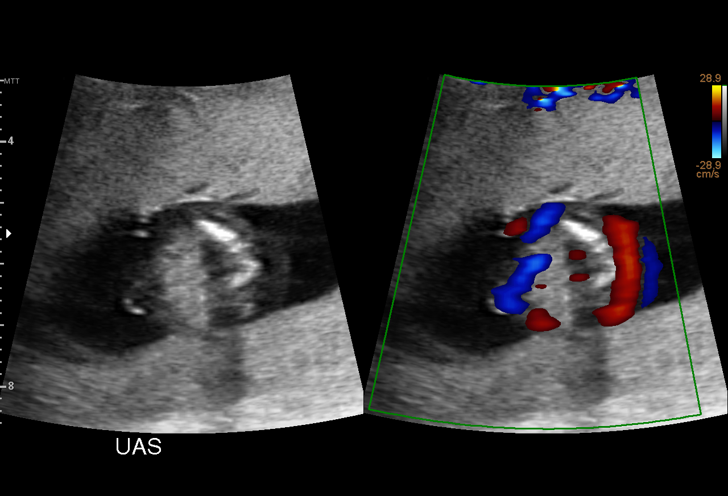
[im 58/72]
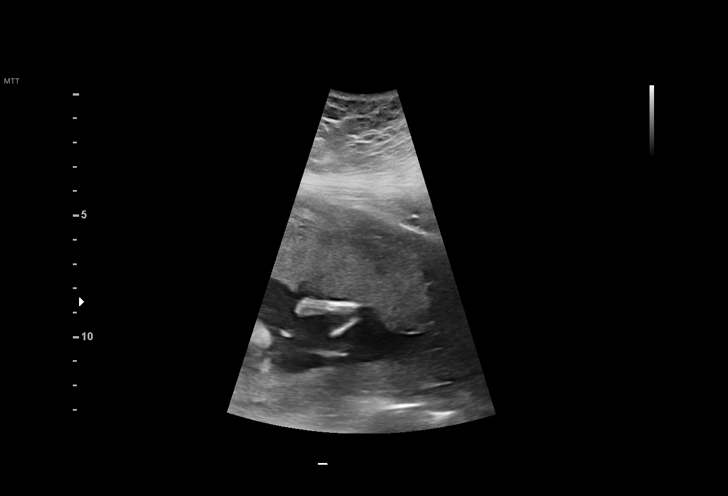
[im 64/72]
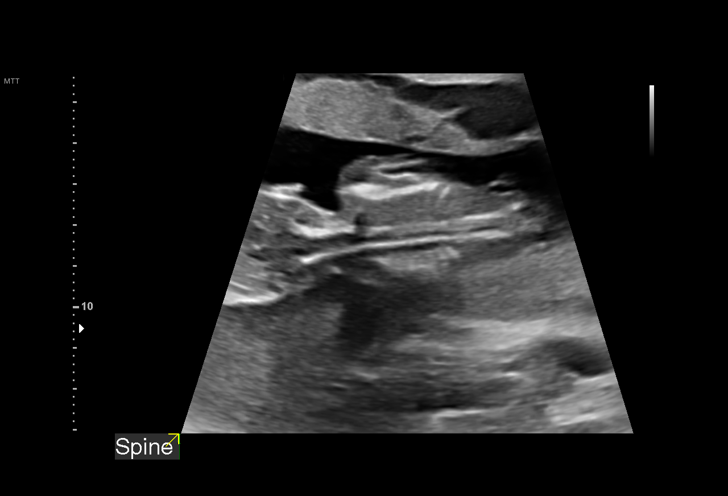
[im 69/72]
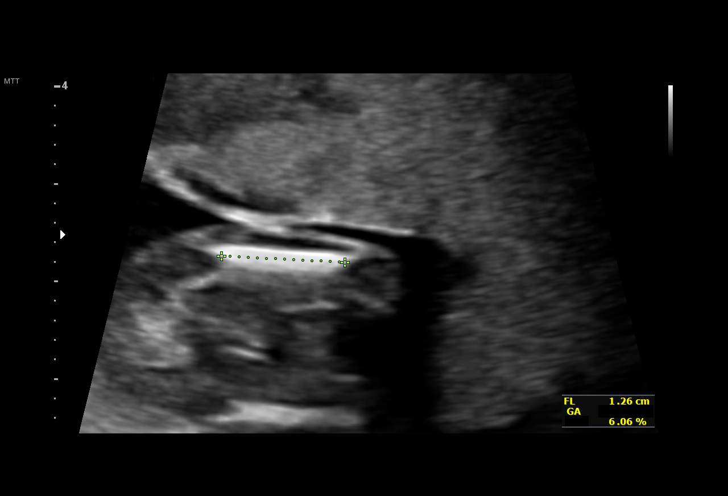

[13 of 28 positions shown; findings below may reference images not displayed]

[REDACTED]care

Indications

 Obesity complicating pregnancy, second
 trimester (BMI 46)
 Grand multiparity, antepartum
 15 weeks gestation of pregnancy
 History of cesarean delivery, currently
 pregnant
 Poor obstetric history: Previous gestational
 diabetes
 Tobacco use complicating pregnancy,
 second trimester
 Encounter for uncertain dates
 Previous cervical surgery (Cone Biopsy)
Fetal Evaluation

 Num Of Fetuses:         1
 Fetal Heart Rate(bpm):  160
 Cardiac Activity:       Observed
 Presentation:           Variable
 Placenta:               Anterior
 P. Cord Insertion:      Not well visualized

 Amniotic Fluid
 AFI FV:      Within normal limits

                             Largest Pocket(cm)

Biometry

 CRL:      88.2  mm     G. Age:  N/A                     EDD:

 BPD:      27.4  mm     G. Age:  14w 6d         34  %    CI:         75.1   %    70 - 86
                                                         FL/HC:      12.8   %    15.3 -
 HC:      100.3  mm     G. Age:  14w 5d         15  %    HC/AC:      1.15        1.05 -
 AC:         87  mm     G. Age:  15w 0d         53  %    FL/BPD:     46.7   %
 FL:       12.8  mm     G. Age:  13w 5d          7  %    FL/AC:      14.7   %    20 - 24

 Est. FW:      97  gm      0 lb 3 oz      9  %
OB History

 Blood Type:   A+
 Gravidity:    7         Term:   6        Prem:   0        SAB:   0
 TOP:          0       Ectopic:  0        Living: 6
Gestational Age

 LMP:           15w 0d        Date:  11/08/20                 EDD:   08/15/21
 U/S Today:     14w 4d                                        EDD:   08/18/21
 Best:          15w 0d     Det. By:  LMP  (11/08/20)          EDD:   08/15/21
Anatomy

 Cranium:               Visualized             Abdominal Wall:         Visualized
 Choroid Plexus:        Visualized             Cord Vessels:           Visualized
 Cerebellum:            Visualized             Kidneys:                Visualized
 Heart:                 Visualized             Bladder:                Visualized
 Diaphragm:             Visualized             Upper Extremities:      Visualized
 Stomach:               Visualized             Lower Extremities:      Visualized
 Abdomen:               Visualized

 Other:  Technically difficult due to maternal habitus and early GA.
Cervix Uterus Adnexa

 Cervix
 Length:           3.44  cm.
 Normal appearance by transabdominal scan.

 Uterus
 No abnormality visualized.

 Right Ovary
 Within normal limits.

 Left Ovary
 Within normal limits.

 Cul De Sac
 No free fluid seen.

 Adnexa
 No abnormality visualized.
Comments

 This patient was seen for an ultrasound exam as she was
 unsure of her dates and due to grand multiparity.  She has
 had 6 prior full-term births.  Her first 4 deliveries were full-
 term vaginal births.  Her last two deliveries were full-term C-
 sections due to placental abruption and arrest of descent.
 She reports that she had a cone biopsy in 7000 prior to her
 fifth pregnancy.  She had another colposcopy with biopsy in
 May 2020.

 She denies any significant past medical history and denies
 any problems in her current pregnancy.

 She just had the [employer] cell free DNA test drawn 6 days
 ago to screen for fetal aneuploidy.  The results are currently
 pending.

 A viable singleton gestation with normal amniotic fluid was
 noted today.

 Based on the fetal biometry measurements obtained today,
 her EDC should be kept as August 15, 2021.

 The patient was reassured that despite her cone biopsy, the
 fact that she has had 6 prior full-term births, probably
 indicates that she will most likely have another full-term birth
 in  this pregnancy.

 Due to her history of prior cone biopsy, a transvaginal
 cervical length was scheduled in 2 weeks.

## 2022-07-30 ENCOUNTER — Ambulatory Visit (HOSPITAL_COMMUNITY): Payer: Medicaid Other

## 2022-10-11 ENCOUNTER — Encounter: Payer: Self-pay | Admitting: Hematology

## 2022-10-12 ENCOUNTER — Encounter: Payer: Self-pay | Admitting: Hematology

## 2022-10-12 ENCOUNTER — Ambulatory Visit
Admission: RE | Admit: 2022-10-12 | Discharge: 2022-10-12 | Disposition: A | Discharge status: Home / Self Care | Payer: MEDICAID | Source: Ambulatory Visit | Attending: Physician Assistant | Admitting: Physician Assistant

## 2022-10-12 VITALS — BP 112/65 | HR 92 | Temp 97.9°F | Resp 16

## 2022-10-12 DIAGNOSIS — M25561 Pain in right knee: Secondary | ICD-10-CM | POA: Diagnosis not present

## 2022-10-12 DIAGNOSIS — L600 Ingrowing nail: Secondary | ICD-10-CM | POA: Diagnosis not present

## 2022-10-12 MED ORDER — DOXYCYCLINE HYCLATE 100 MG PO CAPS: 100.0000 mg | ORAL_CAPSULE | Freq: Two times a day (BID) | ORAL | 0 refills | Status: AC

## 2022-10-12 NOTE — ED Triage Notes (Signed)
Big toe on right foot painful x 3 days. Originally thought it was an ingrown toenail, had a nail tech at the salon try cutting it out. Had some mild success with that but was unable to tolerate the full procedure.   Also reports pain with right knee x 4 days following sleeping on the couch, no other known injury. Hx of dislocated patella with tendon/ligament involvement, no hx of corrective surgery per patient.
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
## 2022-12-03 DIAGNOSIS — M25562 Pain in left knee: Secondary | ICD-10-CM | POA: Insufficient documentation

## 2022-12-08 ENCOUNTER — Encounter (HOSPITAL_COMMUNITY): Payer: Self-pay

## 2022-12-08 ENCOUNTER — Ambulatory Visit (HOSPITAL_COMMUNITY)
Admission: EM | Admit: 2022-12-08 | Discharge: 2022-12-08 | Disposition: A | Discharge status: Home / Self Care | Payer: MEDICAID

## 2022-12-08 DIAGNOSIS — M545 Low back pain, unspecified: Secondary | ICD-10-CM | POA: Diagnosis not present

## 2022-12-08 DIAGNOSIS — G8929 Other chronic pain: Secondary | ICD-10-CM | POA: Diagnosis not present

## 2022-12-08 MED ORDER — PREDNISONE 10 MG PO TABS: ORAL_TABLET | ORAL | 0 refills | Status: DC

## 2022-12-08 MED ORDER — KETOROLAC TROMETHAMINE 30 MG/ML IJ SOLN
INTRAMUSCULAR | Status: AC
  Filled 2022-12-08: qty 1

## 2022-12-08 MED ORDER — METHOCARBAMOL 500 MG PO TABS: 500.0000 mg | ORAL_TABLET | Freq: Two times a day (BID) | ORAL | 0 refills | Status: DC

## 2022-12-08 MED ORDER — KETOROLAC TROMETHAMINE 30 MG/ML IJ SOLN
30.0000 mg | Freq: Once | INTRAMUSCULAR | Status: AC
  Administered 2022-12-08: 30 mg via INTRAMUSCULAR
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
## 2022-12-08 NOTE — ED Triage Notes (Signed)
Patient here today with c/o LB pain X 3 days. Heat helps. She took IBU yesterday and some expired muscle relaxers with no relief. No known injury.

## 2022-12-08 NOTE — Discharge Instructions (Addendum)
You were seen today for acute on chronic low back pain.  He received an injection of anti-inflammatories.  I am sending in prednisone for you to take to help decrease the pain and inflammation in your back.  I have also sent in muscle relaxers for you to take every 8 hours as needed, these may cause sedation.  I encouraged heat, stretching and massage.  Please follow-up if your symptoms persist or worsen.

## 2022-12-26 ENCOUNTER — Other Ambulatory Visit (HOSPITAL_COMMUNITY)
Admission: RE | Admit: 2022-12-26 | Discharge: 2022-12-26 | Disposition: A | Discharge status: Home / Self Care | Payer: MEDICAID | Source: Ambulatory Visit | Attending: Obstetrics and Gynecology | Admitting: Obstetrics and Gynecology

## 2022-12-26 ENCOUNTER — Ambulatory Visit: Payer: MEDICAID | Admitting: Obstetrics and Gynecology

## 2022-12-26 ENCOUNTER — Encounter: Payer: Self-pay | Admitting: Obstetrics and Gynecology

## 2022-12-26 VITALS — BP 143/90 | HR 94 | Wt 271.6 lb

## 2022-12-26 DIAGNOSIS — Z1330 Encounter for screening examination for mental health and behavioral disorders, unspecified: Secondary | ICD-10-CM | POA: Diagnosis not present

## 2022-12-26 DIAGNOSIS — R87613 High grade squamous intraepithelial lesion on cytologic smear of cervix (HGSIL): Secondary | ICD-10-CM | POA: Insufficient documentation

## 2022-12-26 DIAGNOSIS — Z124 Encounter for screening for malignant neoplasm of cervix: Secondary | ICD-10-CM

## 2022-12-26 DIAGNOSIS — N898 Other specified noninflammatory disorders of vagina: Secondary | ICD-10-CM
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
## 2022-12-27 ENCOUNTER — Other Ambulatory Visit: Payer: Self-pay | Admitting: Obstetrics and Gynecology

## 2022-12-27 DIAGNOSIS — B3731 Acute candidiasis of vulva and vagina: Secondary | ICD-10-CM

## 2022-12-27 LAB — CERVICOVAGINAL ANCILLARY ONLY
Bacterial Vaginitis (gardnerella): NEGATIVE
Candida Glabrata: NEGATIVE
Candida Vaginitis: POSITIVE — AB
Comment: NEGATIVE
Comment: NEGATIVE
Comment: NEGATIVE

## 2022-12-27 LAB — SURGICAL PATHOLOGY

## 2022-12-27 MED ORDER — FLUCONAZOLE 150 MG PO TABS: 150.0000 mg | ORAL_TABLET | Freq: Once | ORAL | 0 refills | Status: AC

## 2022-12-27 NOTE — Progress Notes (Signed)
    GYNECOLOGY OFFICE COLPOSCOPY PROCEDURE NOTE  37 y.o. 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 Discussed role for HPV in cervical dysplasia, need for surveillance.  Patient gave informed written consent, time out was performed.  Placed in lithotomy position. Cervix viewed with speculum and colposcope after application of acetic acid.   Colposcopy adequate? No - difficulty with visualization of full cervix   no visible lesions and no abnormal vasculature; corresponding biopsies obtained.  ECC specimen obtained. Lugols applied and appeared to be uptake throughout cervix All specimens were labeled and sent to pathology.  Chaperone was present during entire procedure.  Patient was given post procedure instructions.  Will follow up pathology and manage accordingly; patient will be contacted with results and recommendations.  Routine preventative health maintenance measures emphasized.   Lorriane Shire, MD, FACOG Minimally Invasive Gynecologic Surgery  Obstetrics and Gynecology, [facility] for [facility], [facility] 12/27/2022

## 2022-12-31 LAB — CYTOLOGY - PAP
Adequacy: ABSENT
Chlamydia: NEGATIVE
Comment: NEGATIVE
Comment: NEGATIVE
Comment: NEGATIVE
Comment: NORMAL
High risk HPV: NEGATIVE
Neisseria Gonorrhea: NEGATIVE
Trichomonas: NEGATIVE

## 2023-01-10 DIAGNOSIS — M25369 Other instability, unspecified knee: Secondary | ICD-10-CM | POA: Insufficient documentation

## 2023-01-23 ENCOUNTER — Ambulatory Visit
Admission: RE | Admit: 2023-01-23 | Discharge: 2023-01-23 | Disposition: A | Discharge status: Home / Self Care | Payer: MEDICAID | Source: Ambulatory Visit | Attending: Emergency Medicine | Admitting: Emergency Medicine

## 2023-01-23 ENCOUNTER — Other Ambulatory Visit: Payer: Self-pay

## 2023-01-23 VITALS — BP 145/81 | HR 99 | Temp 98.7°F | Resp 18

## 2023-01-23 DIAGNOSIS — R051 Acute cough: Secondary | ICD-10-CM

## 2023-01-23 DIAGNOSIS — J0140 Acute pansinusitis, unspecified: Secondary | ICD-10-CM

## 2023-01-23 DIAGNOSIS — Z3202 Encounter for pregnancy test, result negative: Secondary | ICD-10-CM

## 2023-01-23 DIAGNOSIS — J209 Acute bronchitis, unspecified: Secondary | ICD-10-CM

## 2023-01-23 LAB — POCT URINE PREGNANCY: Preg Test, Ur: NEGATIVE

## 2023-01-23 MED ORDER — ALBUTEROL SULFATE HFA 108 (90 BASE) MCG/ACT IN AERS
1.0000 | INHALATION_SPRAY | RESPIRATORY_TRACT | 0 refills | Status: AC | PRN
Start: 2023-01-23 — End: ?

## 2023-01-23 MED ORDER — NAPROXEN 500 MG PO TABS: 500.0000 mg | ORAL_TABLET | Freq: Two times a day (BID) | ORAL | 0 refills | Status: DC

## 2023-01-23 MED ORDER — PROMETHAZINE-DM 6.25-15 MG/5ML PO SYRP: 5.0000 mL | ORAL_SOLUTION | Freq: Four times a day (QID) | ORAL | 0 refills | Status: DC | PRN

## 2023-01-23 MED ORDER — FLUTICASONE PROPIONATE 50 MCG/ACT NA SUSP: 2.0000 | Freq: Every day | NASAL | 0 refills | Status: AC

## 2023-01-23 MED ORDER — AMOXICILLIN-POT CLAVULANATE 875-125 MG PO TABS: 1.0000 | ORAL_TABLET | Freq: Two times a day (BID) | ORAL | 0 refills | Status: DC

## 2023-01-23 MED ORDER — PREDNISONE 20 MG PO TABS
40.0000 mg | ORAL_TABLET | Freq: Every day | ORAL | 0 refills | Status: AC
Start: 2023-01-23 — End: 2023-01-28

## 2023-01-23 MED ORDER — AEROCHAMBER MV MISC: 1 refills | Status: AC

## 2023-01-23 NOTE — ED Triage Notes (Signed)
Cough, congestion, nasal drainage x 2 weeks. Also pain above eyes. Denies known fever. Chest hurts with cough.
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
## 2023-01-23 NOTE — Discharge Instructions (Signed)
Urine pregnancy negative. Start Mucinex-D to keep the mucous thin and to decongest you.   You may take the Naprosyn with 1000 mg of Tylenol twice a day this is an effective combination for pain.  Finish the Augmentin and prednisone, even if you feel better. Use a NeilMed sinus rinse with distilled water as often as you want to to reduce nasal congestion. Follow the directions on the box.   2 puffs from your albuterol inhaler using your spacer every 4 hours for 2 days, then every 6 hours for 2 days, then as needed.  You can back off on the albuterol if you start to improve sooner.  Promethazine DM for cough.  Go to www.goodrx.com to look up your medications. This will give you a list of where you can find your prescriptions at the most affordable prices. Or you can ask the pharmacist what the cash price is. This is frequently cheaper than going through insurance.

## 2023-01-23 NOTE — ED Triage Notes (Signed)
Runny nose stuffy nose constant cough chills sinuses bothering me - Entered by patient

## 2023-02-05 ENCOUNTER — Ambulatory Visit
Admission: RE | Admit: 2023-02-05 | Discharge: 2023-02-05 | Disposition: A | Discharge status: Home / Self Care | Payer: MEDICAID | Source: Ambulatory Visit

## 2023-02-05 ENCOUNTER — Ambulatory Visit: Payer: MEDICAID

## 2023-02-05 VITALS — BP 149/90 | HR 102 | Temp 98.3°F | Resp 16

## 2023-02-05 DIAGNOSIS — M79672 Pain in left foot: Secondary | ICD-10-CM

## 2023-02-05 MED ORDER — PREDNISONE 20 MG PO TABS: 40.0000 mg | ORAL_TABLET | Freq: Every day | ORAL | 0 refills | Status: AC
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
## 2023-02-05 NOTE — ED Triage Notes (Signed)
Pt reports pain, redness and swelling on left foot her pinky and 4th toe that started 3 days ago. No recent injury or fall.   Pt states she just finished amoxicillin 3 days ago and is having vaginal discomfort and irritation x 4 days.

## 2023-04-09 ENCOUNTER — Other Ambulatory Visit: Payer: Self-pay | Admitting: Obstetrics and Gynecology

## 2023-04-09 DIAGNOSIS — N939 Abnormal uterine and vaginal bleeding, unspecified: Secondary | ICD-10-CM

## 2023-04-26 ENCOUNTER — Ambulatory Visit
Admission: EM | Admit: 2023-04-26 | Discharge: 2023-04-26 | Disposition: A | Discharge status: Home / Self Care | Payer: MEDICAID | Attending: Family Medicine | Admitting: Family Medicine

## 2023-04-26 DIAGNOSIS — U071 COVID-19: Secondary | ICD-10-CM | POA: Diagnosis not present

## 2023-04-26 LAB — POC COVID19/FLU A&B COMBO
Covid Antigen, POC: POSITIVE — AB
Influenza A Antigen, POC: NEGATIVE
Influenza B Antigen, POC: NEGATIVE

## 2023-04-26 MED ORDER — IBUPROFEN 600 MG PO TABS: 600.0000 mg | ORAL_TABLET | Freq: Four times a day (QID) | ORAL | 0 refills | Status: DC | PRN

## 2023-04-26 MED ORDER — PAXLOVID (300/100) 20 X 150 MG & 10 X 100MG PO TBPK: ORAL_TABLET | ORAL | 0 refills | Status: DC

## 2023-04-26 MED ORDER — PROMETHAZINE-DM 6.25-15 MG/5ML PO SYRP: 5.0000 mL | ORAL_SOLUTION | Freq: Three times a day (TID) | ORAL | 0 refills | Status: DC | PRN

## 2023-04-26 MED ORDER — CETIRIZINE HCL 10 MG PO TABS: 10.0000 mg | ORAL_TABLET | Freq: Every day | ORAL | 0 refills | Status: AC

## 2023-04-26 NOTE — ED Triage Notes (Signed)
Pt reports runny nose, scratchy throat, loose stools since lats night; body aches, cough started today. Taking Childrens cold meds without relief.
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
## 2023-05-23 ENCOUNTER — Ambulatory Visit
Admission: RE | Admit: 2023-05-23 | Discharge: 2023-05-23 | Disposition: A | Discharge status: Home / Self Care | Payer: MEDICAID | Source: Ambulatory Visit | Attending: Nurse Practitioner | Admitting: Nurse Practitioner

## 2023-05-23 ENCOUNTER — Ambulatory Visit (INDEPENDENT_AMBULATORY_CARE_PROVIDER_SITE_OTHER): Payer: MEDICAID

## 2023-05-23 VITALS — BP 128/82 | HR 87 | Temp 98.7°F | Resp 18

## 2023-05-23 DIAGNOSIS — M25512 Pain in left shoulder: Secondary | ICD-10-CM

## 2023-05-23 MED ORDER — CYCLOBENZAPRINE HCL 5 MG PO TABS: 5.0000 mg | ORAL_TABLET | Freq: Every day | ORAL | 0 refills | Status: AC

## 2023-05-23 MED ORDER — IBUPROFEN 800 MG PO TABS: 800.0000 mg | ORAL_TABLET | Freq: Three times a day (TID) | ORAL | 0 refills | Status: AC

## 2023-05-23 NOTE — ED Triage Notes (Signed)
 Pt reports fall down the stairs in her home this past Monday. She missed the second step down and rolled the rest of the way down wooden staircase. Did not hit her head and no LOC. Not on anticoagulants. Pt reports continued severe pain down her L side into shoulder and low back. No visible bruising or swelling to site. No relief with tylenol and ibuprofen. Pt has not been seen anywhere else for fall, thought the pain would subside with time but continues to increase.

## 2023-05-23 NOTE — Discharge Instructions (Addendum)
 You have been you have been diagnosed with acute left shoulder pain secondary to your fall.  Your official x-ray over read is pending.  Results will be released in your MyChart.  You will receive a call for any abnormal results additional treatment options and next steps.  You have been prescribed ibuprofen 800 mg 1 tablet at up to 3 times daily.  You are recommended to take this over the next 2 to 3 days to help decrease inflammation which show help with your overall pain.  You have also been provided with a muscle relaxant cyclobenzaprine 5 mg nightly to help with muscle stiffness and pain. You can always follow-up in urgent care or any nearest emergency room for worsening symptoms.  If symptoms persist you are encouraged to consider following up with [facility].
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
## 2023-09-19 ENCOUNTER — Ambulatory Visit: Payer: MEDICAID

## 2023-09-19 ENCOUNTER — Other Ambulatory Visit: Payer: Self-pay

## 2023-09-19 ENCOUNTER — Ambulatory Visit
Admission: RE | Admit: 2023-09-19 | Discharge: 2023-09-19 | Disposition: A | Discharge status: Home / Self Care | Payer: MEDICAID | Source: Ambulatory Visit | Attending: Family Medicine | Admitting: Family Medicine

## 2023-09-19 VITALS — BP 155/103 | HR 78 | Temp 98.2°F | Resp 17

## 2023-09-19 DIAGNOSIS — M5412 Radiculopathy, cervical region: Secondary | ICD-10-CM

## 2023-09-19 LAB — POCT FASTING CBG KUC MANUAL ENTRY: POCT Glucose (KUC): 104 mg/dL — AB (ref 70–99)

## 2023-09-19 MED ORDER — PREDNISONE 50 MG PO TABS: 50.0000 mg | ORAL_TABLET | Freq: Every day | ORAL | 0 refills | Status: AC

## 2023-09-19 MED ORDER — CYCLOBENZAPRINE HCL 5 MG PO TABS: 5.0000 mg | ORAL_TABLET | Freq: Every evening | ORAL | 0 refills | Status: AC | PRN
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
## 2023-09-19 NOTE — ED Triage Notes (Signed)
 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 Pt states she feels a pulling when she bends her head forward and it radiates to mid back. PT c/o numbness and tingling form right elbow to right hand. Pt has 2+ radial pulse bilat, cap refill less than 3 sec, warm to touch, equal grip strength.

## 2023-10-09 ENCOUNTER — Ambulatory Visit
Admission: RE | Admit: 2023-10-09 | Discharge: 2023-10-09 | Disposition: A | Discharge status: Home / Self Care | Payer: MEDICAID | Source: Ambulatory Visit | Attending: Family Medicine | Admitting: Family Medicine

## 2023-10-09 VITALS — BP 143/86 | HR 84 | Temp 98.7°F | Resp 18

## 2023-10-09 DIAGNOSIS — L309 Dermatitis, unspecified: Secondary | ICD-10-CM

## 2023-10-09 MED ORDER — TRIAMCINOLONE ACETONIDE 0.1 % EX CREA: 1.0000 | TOPICAL_CREAM | Freq: Two times a day (BID) | CUTANEOUS | 0 refills | Status: AC

## 2023-10-09 NOTE — ED Triage Notes (Addendum)
 Pt present with lt index finger nail pain x yesterday morning. States she might have hit her finger against something in her sleep. States yesterday she noticed her finger was blue.
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
## 2023-10-09 NOTE — Discharge Instructions (Addendum)
 Start triamcinolone  topical steroid cream twice daily to your rash areas as needed.  Keep area hydrated and covered while at work.  He will need to have the gel removed from your fingernail before can be fully examined.  Once you have it removed if you are concerned for any type of injury please return to clinic and have it reexamined.  Please go to the ER for any worsening symptoms.  Hope you feel better soon!

## 2023-10-30 ENCOUNTER — Ambulatory Visit
Admission: RE | Admit: 2023-10-30 | Discharge: 2023-10-30 | Disposition: A | Discharge status: Home / Self Care | Payer: MEDICAID | Source: Ambulatory Visit

## 2023-10-30 ENCOUNTER — Ambulatory Visit: Payer: MEDICAID

## 2023-10-30 VITALS — BP 133/83 | HR 94 | Temp 97.8°F | Resp 17

## 2023-10-30 DIAGNOSIS — R399 Unspecified symptoms and signs involving the genitourinary system: Secondary | ICD-10-CM | POA: Insufficient documentation

## 2023-10-30 LAB — POCT URINE DIPSTICK
Bilirubin, UA: NEGATIVE
Glucose, UA: NEGATIVE mg/dL
Ketones, POC UA: NEGATIVE mg/dL
Nitrite, UA: NEGATIVE
Protein Ur, POC: 30 mg/dL — AB
Spec Grav, UA: >=1.03 — AB (ref 1.010–1.025)
Urobilinogen, UA: 1 U/dL
pH, UA: 7 (ref 5.0–8.0)

## 2023-10-30 LAB — POCT URINE PREGNANCY: Preg Test, Ur: NEGATIVE

## 2023-10-30 MED ORDER — PHENAZOPYRIDINE HCL 200 MG PO TABS: 200.0000 mg | ORAL_TABLET | Freq: Three times a day (TID) | ORAL | 0 refills | Status: AC

## 2023-10-30 MED ORDER — NITROFURANTOIN MONOHYD MACRO 100 MG PO CAPS: 100.0000 mg | ORAL_CAPSULE | Freq: Two times a day (BID) | ORAL | 0 refills | Status: AC

## 2023-10-30 NOTE — Discharge Instructions (Addendum)
  1. UTI symptoms (Primary) - POCT urine pregnancy completed in UC is negative - POCT URINE DIPSTICK completed in UC shows small leukocytes, trace blood, no nitrite, these findings are possibly indicative of early urinary tract infection. - Urine Culture collected and sent to lab for further testing results should be available in 2 to 3 days. - phenazopyridine  (PYRIDIUM ) 200 MG tablet; Take 1 tablet (200 mg total) by mouth 3 (three) times daily.  Dispense: 6 tablet; Refill: 0 - nitrofurantoin , macrocrystal-monohydrate, (MACROBID ) 100 MG capsule; Take 1 capsule (100 mg total) by mouth 2 (two) times daily.  Dispense: 10 capsule; Refill: 0 -Continue to monitor symptoms for any change in severity if there is any escalation of current symptoms or development of new symptoms follow-up in ER for further evaluation and management.

## 2023-10-30 NOTE — ED Triage Notes (Signed)
 Pt c/o lower back pain, bladder fullness, urinary frequency, and abdominal pain since yesterday afternoon.
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
## 2023-10-31 LAB — URINE CULTURE
Culture: 80000 — AB
Special Requests: NORMAL

## 2023-11-07 ENCOUNTER — Ambulatory Visit (HOSPITAL_COMMUNITY): Payer: Self-pay

## 2023-11-07 MED ORDER — CEFDINIR 300 MG PO CAPS: 300.0000 mg | ORAL_CAPSULE | Freq: Two times a day (BID) | ORAL | 0 refills | Status: AC

## 2023-11-30 ENCOUNTER — Emergency Department (HOSPITAL_BASED_OUTPATIENT_CLINIC_OR_DEPARTMENT_OTHER)
Admission: EM | Admit: 2023-11-30 | Discharge: 2023-11-30 | Disposition: A | Discharge status: Home / Self Care | Payer: MEDICAID | Attending: Emergency Medicine | Admitting: Emergency Medicine

## 2023-11-30 ENCOUNTER — Other Ambulatory Visit: Payer: Self-pay

## 2023-11-30 DIAGNOSIS — K0889 Other specified disorders of teeth and supporting structures: Secondary | ICD-10-CM | POA: Insufficient documentation

## 2023-11-30 DIAGNOSIS — G44209 Tension-type headache, unspecified, not intractable: Secondary | ICD-10-CM | POA: Insufficient documentation

## 2023-11-30 DIAGNOSIS — Z91040 Latex allergy status: Secondary | ICD-10-CM | POA: Insufficient documentation

## 2023-11-30 MED ORDER — OXYCODONE-ACETAMINOPHEN 5-325 MG PO TABS
2.0000 | ORAL_TABLET | Freq: Once | ORAL | Status: AC
  Administered 2023-11-30: 2 via ORAL
  Filled 2023-11-30: qty 2

## 2023-11-30 MED ORDER — KETOROLAC TROMETHAMINE 10 MG PO TABS
10.0000 mg | ORAL_TABLET | Freq: Four times a day (QID) | ORAL | 0 refills | Status: AC | PRN
Start: 2023-11-30 — End: ?

## 2023-11-30 MED ORDER — CLINDAMYCIN HCL 150 MG PO CAPS: 450.0000 mg | ORAL_CAPSULE | Freq: Three times a day (TID) | ORAL | 0 refills | Status: AC

## 2023-11-30 MED ORDER — HYDROCODONE-ACETAMINOPHEN 5-325 MG PO TABS: 1.0000 | ORAL_TABLET | Freq: Four times a day (QID) | ORAL | 0 refills | Status: AC | PRN

## 2023-11-30 MED ORDER — ACETAMINOPHEN 325 MG PO TABS
650.0000 mg | ORAL_TABLET | Freq: Once | ORAL | Status: AC
  Administered 2023-11-30: 650 mg via ORAL
  Filled 2023-11-30: qty 2

## 2023-11-30 MED ORDER — KETOROLAC TROMETHAMINE 60 MG/2ML IM SOLN
30.0000 mg | Freq: Once | INTRAMUSCULAR | Status: AC
  Administered 2023-11-30: 30 mg via INTRAMUSCULAR
  Filled 2023-11-30: qty 2

## 2023-11-30 NOTE — Discharge Instructions (Addendum)
 Follow-up with your dentist tomorrow for further evaluation.  You have been prescribed antibiotics and pain medication.  Return for any severe worsening symptoms.

## 2023-11-30 NOTE — ED Triage Notes (Signed)
 Pt has upper left dental pain x 1 week. Pt also complains of headache with double vision that began tonight. Headache is also on left side.

## 2023-11-30 NOTE — ED Provider Notes (Signed)
 [place] EMERGENCY DEPARTMENT AT [facility] Provider Note   CSN: [id number] Arrival date & time: 11/30/23  9746     Patient presents with: Dental Pain and Headache   Amanda Church is a 38 y.o. female.    Dental Pain Associated symptoms: headaches   Headache    38 year old female presenting to the emergency department with dental pain for the past week.  She endorses pain in her left frontal incisors.  Pain radiates to her face.  She denies any facial swelling, denies any fevers or chills.  She subsequently developed headache in the setting of her ongoing dental pain tonight located in a bandlike pattern across her forehead.  She endorses blurry vision.  She denies double vision to me.  She denies any facial droop, focal numbness or weakness.  She no neck stiffness.  She denies any facial swelling, difficulty swallowing or speaking. She has follow-up with a dentist on Monday.  Prior to Admission medications   Medication Sig Start Date End Date Taking? Authorizing Provider  clindamycin  (CLEOCIN ) 150 MG capsule Take 3 capsules (450 mg total) by mouth 3 (three) times daily for 7 days. 11/30/23 12/07/23 Yes Jerrol Agent, MD  HYDROcodone -acetaminophen  (NORCO/VICODIN) 5-325 MG tablet Take 1-2 tablets by mouth every 6 (six) hours as needed. 11/30/23  Yes Jerrol Agent, MD  ketorolac  (TORADOL ) 10 MG tablet Take 1 tablet (10 mg total) by mouth every 6 (six) hours as needed. 11/30/23  Yes Jerrol Agent, MD  albuterol  (VENTOLIN  HFA) 108 (90 Base) MCG/ACT inhaler Inhale 1-2 puffs into the lungs every 4 (four) hours as needed for wheezing or shortness of breath. 01/23/23   Van Knee, MD  cetirizine  (ZYRTEC  ALLERGY) 10 MG tablet Take 1 tablet (10 mg total) by mouth daily. 04/26/23   Christopher Savannah, PA-C  cyclobenzaprine  (FLEXERIL ) 5 MG tablet Take 1 tablet (5 mg total) by mouth at bedtime as needed. 09/19/23   Christopher Savannah, PA-C  etonogestrel -ethinyl estradiol  (NUVARING) 0.12-0.015  MG/24HR vaginal ring INSERT VAGINALLY AND LEAVE IN PLACE FOR 3 CONSECUTIVE WEEKS, THEN REMOVE 1 WEEK 04/09/23   Ajewole, Christana, MD  fluticasone  (FLONASE ) 50 MCG/ACT nasal spray Place 2 sprays into both nostrils daily. 01/23/23   Mortenson, Ashley, MD  montelukast (SINGULAIR) 10 MG tablet Take 10 mg by mouth daily. Patient not taking: Reported on 05/23/2023    [provider]  nitrofurantoin , macrocrystal-monohydrate, (MACROBID ) 100 MG capsule Take 1 capsule (100 mg total) by mouth 2 (two) times daily. 10/30/23   Reddick, Johnathan B, NP  phenazopyridine  (PYRIDIUM ) 200 MG tablet Take 1 tablet (200 mg total) by mouth 3 (three) times daily. 10/30/23   Reddick, Johnathan B, NP  predniSONE  (DELTASONE ) 50 MG tablet Take 1 tablet (50 mg total) by mouth daily with breakfast. 09/19/23   Christopher Savannah, PA-C  Spacer/Aero-Holding Chambers (AEROCHAMBER MV) inhaler Use as instructed 01/23/23   Van Knee, MD  triamcinolone  cream (KENALOG ) 0.1 % Apply 1 Application topically 2 (two) times daily. 10/09/23   Mayer, Jodi R, NP    Allergies: Shellfish allergy and Latex    Review of Systems  HENT:  Positive for dental problem.   Neurological:  Positive for headaches.  All other systems reviewed and are negative.   Updated Vital Signs BP 122/67   Pulse 88   Temp 98.7 F (37.1 C) (Oral)   Resp 18   LMP 11/03/2023 (Approximate)   SpO2 97%   Physical Exam Vitals and nursing note reviewed.  Constitutional:  General: She is not in acute distress. HENT:     Head: Normocephalic and atraumatic.     Comments: No swelling or trismus, no palpable abscess Eyes:     Conjunctiva/sclera: Conjunctivae normal.     Pupils: Pupils are equal, round, and reactive to light.  Cardiovascular:     Rate and Rhythm: Normal rate and regular rhythm.  Pulmonary:     Effort: Pulmonary effort is normal. No respiratory distress.  Abdominal:     General: There is no distension.     Tenderness: There is no  guarding.  Musculoskeletal:        General: No deformity or signs of injury.     Cervical back: Neck supple.  Skin:    Findings: No lesion or rash.  Neurological:     General: No focal deficit present.     Mental Status: She is alert and oriented to person, place, and time. Mental status is at baseline.     GCS: GCS eye subscore is 4. GCS verbal subscore is 5. GCS motor subscore is 6.     (all labs ordered are listed, but only abnormal results are displayed) Labs Reviewed - No data to display  EKG: None  Radiology: No results found.   Procedures   Medications Ordered in the ED  ketorolac  (TORADOL ) injection 30 mg (30 mg Intramuscular Given 11/30/23 0404)  oxyCODONE -acetaminophen  (PERCOCET/ROXICET) 5-325 MG per tablet 2 tablet (2 tablets Oral Given 11/30/23 0404)  acetaminophen  (TYLENOL ) tablet 650 mg (650 mg Oral Given 11/30/23 0404)                                    Medical Decision Making Risk OTC drugs. Prescription drug management.    38 year old female presenting to the emergency department with dental pain for the past week.  She endorses pain in her left frontal incisors.  Pain radiates to her face.  She denies any facial swelling, denies any fevers or chills.  She subsequently developed headache in the setting of her ongoing dental pain tonight located in a bandlike pattern across her forehead.  She endorses blurry vision.  She denies double vision to me.  She denies any facial droop, focal numbness or weakness.  She no neck stiffness.  She denies any facial swelling, difficulty swallowing or speaking. She has follow-up with a dentist on Monday.  On arrival, the patient was afebrile, mildly tachycardic in the setting of pain, not tachypneic RR 20, BP 140/89, saturating her percent on room air.  On exam, the patient had no evidence of odontogenic abscess, no facial swelling, no trismus, no periapical abscess appreciated.  Patient presenting with dental pain with  associated headache and blurred vision.  No focal neurologic deficits.  Low concern for stroke.  No fevers or chills.  Suspect likely headache in the setting of ongoing dental pain.  She does have follow-up with a dentist on Monday.  IM Toradol  and Percocet administered for pain control.  On repeat assessment, the patient was feeling symptomatically improved.  Will discharge the patient on oral opiates for short course as well as antibiotics.       Final diagnoses:  Pain, dental  Acute non intractable tension-type headache    ED Discharge Orders          Ordered    ketorolac  (TORADOL ) 10 MG tablet  Every 6 hours PRN        11/30/23  9557    HYDROcodone -acetaminophen  (NORCO/VICODIN) 5-325 MG tablet  Every 6 hours PRN        11/30/23 0442    clindamycin  (CLEOCIN ) 150 MG capsule  3 times daily        11/30/23 0442               Jerrol Agent, MD 11/30/23 [phone]

## 2023-12-20 DIAGNOSIS — M542 Cervicalgia: Secondary | ICD-10-CM | POA: Insufficient documentation

## 2024-01-02 ENCOUNTER — Other Ambulatory Visit: Payer: Self-pay

## 2024-01-02 ENCOUNTER — Ambulatory Visit: Payer: MEDICAID | Attending: Neurosurgery

## 2024-01-02 DIAGNOSIS — M542 Cervicalgia: Secondary | ICD-10-CM | POA: Insufficient documentation

## 2024-01-02 DIAGNOSIS — M25511 Pain in right shoulder: Secondary | ICD-10-CM | POA: Diagnosis present

## 2024-01-02 DIAGNOSIS — M6281 Muscle weakness (generalized): Secondary | ICD-10-CM | POA: Insufficient documentation

## 2024-01-02 DIAGNOSIS — R293 Abnormal posture: Secondary | ICD-10-CM | POA: Diagnosis present
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
## 2024-01-19 ENCOUNTER — Ambulatory Visit: Payer: MEDICAID | Attending: Neurosurgery

## 2024-01-19 ENCOUNTER — Telehealth: Payer: Self-pay

## 2024-01-19 NOTE — Telephone Encounter (Signed)
 Amanda Church  PT, DPT

## 2024-01-22 ENCOUNTER — Ambulatory Visit: Payer: MEDICAID

## 2024-01-26 ENCOUNTER — Telehealth: Payer: Self-pay

## 2024-01-26 ENCOUNTER — Ambulatory Visit: Payer: MEDICAID
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
## 2024-01-26 NOTE — Telephone Encounter (Signed)
 PT tried calling again but number was not in service. This is her 3rd no show, per attendance policy we will cancel last remaining visit and discharge episode of care. Will need a new referral.  Alm JAYSON Kingdom , PT 01/26/24 10:22 AM

## 2024-01-29 ENCOUNTER — Ambulatory Visit: Payer: MEDICAID

## 2024-01-30 ENCOUNTER — Ambulatory Visit (INDEPENDENT_AMBULATORY_CARE_PROVIDER_SITE_OTHER): Payer: MEDICAID | Admitting: Mental Health

## 2024-01-30 DIAGNOSIS — F319 Bipolar disorder, unspecified: Secondary | ICD-10-CM | POA: Insufficient documentation

## 2024-01-30 DIAGNOSIS — F411 Generalized anxiety disorder: Secondary | ICD-10-CM | POA: Insufficient documentation

## 2024-01-30 DIAGNOSIS — F1010 Alcohol abuse, uncomplicated: Secondary | ICD-10-CM
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
## 2024-02-03 ENCOUNTER — Encounter (HOSPITAL_COMMUNITY): Payer: Self-pay | Admitting: Psychiatry

## 2024-02-03 ENCOUNTER — Ambulatory Visit (HOSPITAL_COMMUNITY): Payer: MEDICAID | Admitting: Psychiatry

## 2024-02-03 DIAGNOSIS — F1610 Hallucinogen abuse, uncomplicated: Secondary | ICD-10-CM | POA: Diagnosis not present

## 2024-02-03 DIAGNOSIS — F331 Major depressive disorder, recurrent, moderate: Secondary | ICD-10-CM

## 2024-02-03 DIAGNOSIS — F411 Generalized anxiety disorder: Secondary | ICD-10-CM

## 2024-02-03 DIAGNOSIS — F5105 Insomnia due to other mental disorder: Secondary | ICD-10-CM | POA: Diagnosis not present

## 2024-02-03 MED ORDER — HYDROXYZINE HCL 10 MG PO TABS
10.0000 mg | ORAL_TABLET | Freq: Three times a day (TID) | ORAL | 3 refills | Status: AC | PRN
Start: 2024-02-03 — End: ?

## 2024-02-03 MED ORDER — ARIPIPRAZOLE 5 MG PO TABS: 5.0000 mg | ORAL_TABLET | Freq: Every day | ORAL | 3 refills | Status: AC

## 2024-02-03 MED ORDER — TRAZODONE HCL 50 MG PO TABS: 50.0000 mg | ORAL_TABLET | Freq: Every evening | ORAL | 3 refills | Status: AC | PRN
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
## 2024-02-04 ENCOUNTER — Other Ambulatory Visit: Payer: Self-pay

## 2024-02-04 ENCOUNTER — Encounter (HOSPITAL_BASED_OUTPATIENT_CLINIC_OR_DEPARTMENT_OTHER): Payer: Self-pay

## 2024-02-04 ENCOUNTER — Emergency Department (HOSPITAL_BASED_OUTPATIENT_CLINIC_OR_DEPARTMENT_OTHER)
Admission: EM | Admit: 2024-02-04 | Discharge: 2024-02-05 | Disposition: A | Discharge status: Home / Self Care | Payer: MEDICAID | Attending: Emergency Medicine | Admitting: Emergency Medicine

## 2024-02-04 ENCOUNTER — Ambulatory Visit
Admission: RE | Admit: 2024-02-04 | Discharge: 2024-02-04 | Disposition: A | Discharge status: Home / Self Care | Payer: MEDICAID | Source: Ambulatory Visit | Attending: Internal Medicine | Admitting: Internal Medicine

## 2024-02-04 ENCOUNTER — Emergency Department (HOSPITAL_BASED_OUTPATIENT_CLINIC_OR_DEPARTMENT_OTHER): Payer: MEDICAID

## 2024-02-04 ENCOUNTER — Emergency Department (HOSPITAL_BASED_OUTPATIENT_CLINIC_OR_DEPARTMENT_OTHER): Payer: MEDICAID | Admitting: Radiology

## 2024-02-04 VITALS — BP 152/91 | HR 110 | Temp 100.6°F | Resp 19

## 2024-02-04 DIAGNOSIS — R509 Fever, unspecified: Secondary | ICD-10-CM

## 2024-02-04 DIAGNOSIS — Z91040 Latex allergy status: Secondary | ICD-10-CM | POA: Insufficient documentation

## 2024-02-04 DIAGNOSIS — D649 Anemia, unspecified: Secondary | ICD-10-CM | POA: Insufficient documentation

## 2024-02-04 DIAGNOSIS — R110 Nausea: Secondary | ICD-10-CM | POA: Insufficient documentation

## 2024-02-04 DIAGNOSIS — M545 Low back pain, unspecified: Secondary | ICD-10-CM | POA: Diagnosis not present

## 2024-02-04 DIAGNOSIS — D72829 Elevated white blood cell count, unspecified: Secondary | ICD-10-CM | POA: Insufficient documentation

## 2024-02-04 DIAGNOSIS — R1031 Right lower quadrant pain: Secondary | ICD-10-CM | POA: Diagnosis not present

## 2024-02-04 LAB — POCT URINE DIPSTICK
Bilirubin, UA: NEGATIVE
Blood, UA: NEGATIVE
Glucose, UA: NEGATIVE mg/dL
Ketones, POC UA: NEGATIVE mg/dL
Nitrite, UA: NEGATIVE
POC PROTEIN,UA: NEGATIVE
Spec Grav, UA: 1.02 (ref 1.010–1.025)
Urobilinogen, UA: 0.2 U/dL
pH, UA: 6 (ref 5.0–8.0)

## 2024-02-04 LAB — CBC WITH DIFFERENTIAL/PLATELET
Abs Immature Granulocytes: 0.05 K/uL (ref 0.00–0.07)
Basophils Absolute: 0 K/uL (ref 0.0–0.1)
Basophils Relative: 0 %
Eosinophils Absolute: 0 K/uL (ref 0.0–0.5)
Eosinophils Relative: 0 %
HCT: 25.2 % — ABNORMAL LOW (ref 36.0–46.0)
Hemoglobin: 7.7 g/dL — ABNORMAL LOW (ref 12.0–15.0)
Immature Granulocytes: 0 %
Lymphocytes Relative: 7 %
Lymphs Abs: 1 K/uL (ref 0.7–4.0)
MCH: 22.2 pg — ABNORMAL LOW (ref 26.0–34.0)
MCHC: 30.6 g/dL (ref 30.0–36.0)
MCV: 72.6 fL — ABNORMAL LOW (ref 80.0–100.0)
Monocytes Absolute: 0.7 K/uL (ref 0.1–1.0)
Monocytes Relative: 5 %
Neutro Abs: 13.3 K/uL — ABNORMAL HIGH (ref 1.7–7.7)
Neutrophils Relative %: 88 %
Platelets: 420 K/uL — ABNORMAL HIGH (ref 150–400)
RBC: 3.47 MIL/uL — ABNORMAL LOW (ref 3.87–5.11)
RDW: 16.4 % — ABNORMAL HIGH (ref 11.5–15.5)
WBC: 15.2 K/uL — ABNORMAL HIGH (ref 4.0–10.5)
nRBC: 0.2 % (ref 0.0–0.2)

## 2024-02-04 LAB — COMPREHENSIVE METABOLIC PANEL WITH GFR
ALT: 30 U/L (ref 0–44)
AST: 21 U/L (ref 15–41)
Albumin: 4.5 g/dL (ref 3.5–5.0)
Alkaline Phosphatase: 82 U/L (ref 38–126)
Anion gap: 12 (ref 5–15)
BUN: 7 mg/dL (ref 6–20)
CO2: 23 mmol/L (ref 22–32)
Calcium: 9.3 mg/dL (ref 8.9–10.3)
Chloride: 100 mmol/L (ref 98–111)
Creatinine, Ser: 0.69 mg/dL (ref 0.44–1.00)
GFR, Estimated: >60 mL/min (ref >60)
Glucose, Bld: 113 mg/dL — ABNORMAL HIGH (ref 70–99)
Potassium: 3.8 mmol/L (ref 3.5–5.1)
Sodium: 135 mmol/L (ref 135–145)
Total Bilirubin: 0.4 mg/dL (ref 0.0–1.2)
Total Protein: 7.3 g/dL (ref 6.5–8.1)

## 2024-02-04 LAB — URINALYSIS, W/ REFLEX TO CULTURE (INFECTION SUSPECTED)
Bacteria, UA: NONE SEEN
Bilirubin Urine: NEGATIVE
Glucose, UA: NEGATIVE mg/dL
Hgb urine dipstick: NEGATIVE
Ketones, ur: NEGATIVE mg/dL
Nitrite: NEGATIVE
Protein, ur: NEGATIVE mg/dL
Specific Gravity, Urine: 1.027 (ref 1.005–1.030)
pH: 6 (ref 5.0–8.0)

## 2024-02-04 LAB — PREGNANCY, URINE: Preg Test, Ur: NEGATIVE

## 2024-02-04 LAB — LIPASE, BLOOD: Lipase: 44 U/L (ref 11–51)

## 2024-02-04 LAB — POC COVID19/FLU A&B COMBO
Covid Antigen, POC: NEGATIVE
Influenza A Antigen, POC: NEGATIVE
Influenza B Antigen, POC: NEGATIVE

## 2024-02-04 LAB — LACTIC ACID, PLASMA: Lactic Acid, Venous: 0.9 mmol/L (ref 0.5–1.9)

## 2024-02-04 MED ORDER — IBUPROFEN 800 MG PO TABS
800.0000 mg | ORAL_TABLET | Freq: Once | ORAL | Status: AC
  Administered 2024-02-04: 800 mg via ORAL
  Filled 2024-02-04: qty 1

## 2024-02-04 MED ORDER — ACETAMINOPHEN 325 MG PO TABS
650.0000 mg | ORAL_TABLET | Freq: Once | ORAL | Status: AC
  Administered 2024-02-04: 650 mg via ORAL

## 2024-02-04 MED ORDER — ACETAMINOPHEN 325 MG PO TABS
650.0000 mg | ORAL_TABLET | Freq: Once | ORAL | Status: AC
  Administered 2024-02-04: 650 mg via ORAL
  Filled 2024-02-04: qty 2

## 2024-02-04 MED ORDER — ONDANSETRON 4 MG PO TBDP
4.0000 mg | ORAL_TABLET | Freq: Once | ORAL | Status: AC
  Administered 2024-02-04: 4 mg via ORAL

## 2024-02-04 MED ORDER — IOHEXOL 300 MG/ML  SOLN
100.0000 mL | Freq: Once | INTRAMUSCULAR | Status: AC | PRN
  Administered 2024-02-04: 100 mL via INTRAVENOUS

## 2024-02-04 MED ORDER — SODIUM CHLORIDE 0.9 % IV BOLUS
1000.0000 mL | Freq: Once | INTRAVENOUS | Status: AC
  Administered 2024-02-04: 1000 mL via INTRAVENOUS

## 2024-02-04 MED ORDER — MORPHINE SULFATE (PF) 4 MG/ML IV SOLN
4.0000 mg | Freq: Once | INTRAVENOUS | Status: AC
Start: 2024-02-04 — End: 2024-02-04
  Administered 2024-02-04: 4 mg via INTRAVENOUS
  Filled 2024-02-04: qty 1

## 2024-02-04 NOTE — ED Notes (Signed)
 Patient transported to CT
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
## 2024-02-04 NOTE — ED Triage Notes (Signed)
 Pt c/o lower middle back pain, body aches, chills for 1 day.

## 2024-02-04 NOTE — ED Triage Notes (Signed)
 Pt brought in by EMS from home c/o fever, joint pain, chills. Pt went to UC for flank/back pain and UC concerned about UTI/pylonephritis.  EMS reports nausea, fever, chills, back pain.

## 2024-02-04 NOTE — Discharge Instructions (Addendum)
 Flu A, flu B and COVID are negative.  Urinalysis is not overwhelming for urinary tract infection.  Patient's acute illness is rather concerning as she does appear in acute distress and does appear ill.  We have given her Zofran  for her nausea and we discussed possibly working her up here but we do think that she needs lab work done urgently versus the labs that we do here that may take 2 days to finalize.  Part of the concern is for a pyelonephritis especially given the symptoms and the worsening presentation.  At this time we recommend further evaluation in the emergency room where more advanced workup can be obtained.  Patient is in agreement.

## 2024-02-05 ENCOUNTER — Emergency Department (HOSPITAL_BASED_OUTPATIENT_CLINIC_OR_DEPARTMENT_OTHER): Payer: MEDICAID

## 2024-02-05 LAB — WET PREP, GENITAL
Clue Cells Wet Prep HPF POC: NONE SEEN
Sperm: NONE SEEN
Trich, Wet Prep: NONE SEEN
WBC, Wet Prep HPF POC: <10 (ref <10)
Yeast Wet Prep HPF POC: NONE SEEN

## 2024-02-05 MED ORDER — DOXYCYCLINE HYCLATE 100 MG PO CAPS: 100.0000 mg | ORAL_CAPSULE | Freq: Two times a day (BID) | ORAL | 0 refills | Status: AC

## 2024-02-05 MED ORDER — SODIUM CHLORIDE 0.9 % IV SOLN
1.0000 g | Freq: Once | INTRAVENOUS | Status: AC
  Administered 2024-02-05: 1 g via INTRAVENOUS
  Filled 2024-02-05: qty 10

## 2024-02-05 MED ORDER — DOXYCYCLINE HYCLATE 100 MG PO TABS
100.0000 mg | ORAL_TABLET | Freq: Once | ORAL | Status: AC
  Administered 2024-02-05: 100 mg via ORAL
  Filled 2024-02-05: qty 1

## 2024-02-05 NOTE — Discharge Instructions (Signed)
 Please return for sudden worsening pain inability to eat or drink.

## 2024-02-05 NOTE — ED Provider Notes (Signed)
 Received patient in turnover from Dr. Pamella.  Please see their note for further details of Hx, PE.  Briefly patient is a 38 y.o. female with a Urinary Tract Infection and Flank Pain (/) .  Patient awaiting pelvic us .  Ultrasound negative for acute intrapelvic pathology.  On my assessment of the patient and she denies any other possible cause of her symptoms denies cough or congestion and so primarily presented with lower abdominal pain and fever.  As that is the case I am going to start her on antibiotics for possible PID.  Does have a history of both gonorrhea and chlamydia.  Dose of Rocephin  here.  2 weeks of doxycycline .  PCP follow-up.    Emil Share, DO 02/05/24 0113

## 2024-02-06 LAB — GC/CHLAMYDIA PROBE AMP (~~LOC~~) NOT AT ARMC
Chlamydia: NEGATIVE
Comment: NEGATIVE
Comment: NORMAL
Neisseria Gonorrhea: NEGATIVE

## 2024-02-17 ENCOUNTER — Telehealth (HOSPITAL_COMMUNITY): Payer: Self-pay | Admitting: Psychiatry

## 2024-02-17 ENCOUNTER — Encounter (HOSPITAL_COMMUNITY): Payer: Self-pay | Admitting: Psychiatry

## 2024-02-17 NOTE — Telephone Encounter (Signed)
 Patient stopped by the office and asked if one of her providers could write a letter that has her diagnosis on it. Her case worked said that [employer] information would not work and having that letter would be better. It could be Dr. Harl or Paige Cozart. [phone] is the best callback number. Please advise. Thank you.

## 2024-02-17 NOTE — Telephone Encounter (Signed)
 Letter written.  Patient can access it via MyChart.

## 2024-03-04 ENCOUNTER — Other Ambulatory Visit: Payer: Self-pay

## 2024-03-04 DIAGNOSIS — R5383 Other fatigue: Secondary | ICD-10-CM | POA: Diagnosis present

## 2024-03-04 DIAGNOSIS — Z5321 Procedure and treatment not carried out due to patient leaving prior to being seen by health care provider: Secondary | ICD-10-CM | POA: Diagnosis not present

## 2024-03-04 DIAGNOSIS — R0602 Shortness of breath: Secondary | ICD-10-CM | POA: Diagnosis not present

## 2024-03-04 DIAGNOSIS — R079 Chest pain, unspecified: Secondary | ICD-10-CM | POA: Insufficient documentation

## 2024-03-04 LAB — CBG MONITORING, ED: Glucose-Capillary: 137 mg/dL — ABNORMAL HIGH (ref 70–99)

## 2024-03-04 LAB — RESP PANEL BY RT-PCR (RSV, FLU A&B, COVID)  RVPGX2
Influenza A by PCR: NEGATIVE
Influenza B by PCR: NEGATIVE
Resp Syncytial Virus by PCR: NEGATIVE
SARS Coronavirus 2 by RT PCR: NEGATIVE

## 2024-03-04 NOTE — ED Triage Notes (Signed)
 Pt bib ems from home with c/o fatigue and shob today with chest pain on inspiration. Sister recently diagnosed with flu  136/88 100HR 22RR 98RA [id number]

## 2024-03-05 ENCOUNTER — Emergency Department (HOSPITAL_BASED_OUTPATIENT_CLINIC_OR_DEPARTMENT_OTHER)
Admission: EM | Admit: 2024-03-05 | Discharge: 2024-03-05 | Discharge status: Against Medical Advice | Payer: MEDICAID | Attending: Emergency Medicine | Admitting: Emergency Medicine

## 2024-03-09 ENCOUNTER — Other Ambulatory Visit: Payer: Self-pay

## 2024-03-09 ENCOUNTER — Ambulatory Visit
Admission: RE | Admit: 2024-03-09 | Discharge: 2024-03-09 | Disposition: A | Discharge status: Other Acute Inpatient Hospital | Payer: MEDICAID | Source: Ambulatory Visit

## 2024-03-09 ENCOUNTER — Emergency Department (HOSPITAL_COMMUNITY)
Admission: EM | Admit: 2024-03-09 | Discharge: 2024-03-09 | Discharge status: Against Medical Advice | Payer: MEDICAID | Source: Ambulatory Visit | Attending: Emergency Medicine | Admitting: Emergency Medicine

## 2024-03-09 VITALS — BP 127/80 | HR 105 | Temp 98.9°F | Resp 18

## 2024-03-09 DIAGNOSIS — Z5321 Procedure and treatment not carried out due to patient leaving prior to being seen by health care provider: Secondary | ICD-10-CM | POA: Diagnosis not present

## 2024-03-09 DIAGNOSIS — R519 Headache, unspecified: Secondary | ICD-10-CM | POA: Diagnosis not present

## 2024-03-09 DIAGNOSIS — R42 Dizziness and giddiness: Secondary | ICD-10-CM | POA: Diagnosis not present

## 2024-03-09 DIAGNOSIS — H539 Unspecified visual disturbance: Secondary | ICD-10-CM

## 2024-03-09 DIAGNOSIS — H5711 Ocular pain, right eye: Secondary | ICD-10-CM | POA: Insufficient documentation

## 2024-03-09 NOTE — ED Notes (Signed)
 Patient dropped off stickers and stated that she was leaving and going home and walked out the front door

## 2024-03-09 NOTE — ED Triage Notes (Signed)
 Pt here for HA and eye pain x 1 month; pt sts vision changes that come and go that she describes as static; pt sts left eye is worse than right

## 2024-03-09 NOTE — Discharge Instructions (Signed)
 Because of the duration and intensity of your headache, along with visual changes and dizziness, I've recommended that you present to the Emergency Department for a higher level evaluation and management.

## 2024-03-09 NOTE — ED Triage Notes (Signed)
 Pt arrives via POV from UC. PT reports she has been having a headache and right eye pain for the past month. Pt is AxOx4.
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
## 2024-03-09 NOTE — ED Notes (Signed)
 Patient is being discharged from the Urgent Care and sent to the Emergency Department via POV . Per AW, patient is in need of higher level of care due to HA/vision change. Patient is aware and verbalizes understanding of plan of care.  Vitals:   03/09/24 1214  BP: 127/80  Pulse: (!) 105  Resp: 18  Temp: 98.9 F (37.2 C)  SpO2: 100%

## 2024-03-24 ENCOUNTER — Encounter (HOSPITAL_COMMUNITY): Payer: Self-pay

## 2024-03-25 ENCOUNTER — Other Ambulatory Visit: Payer: Self-pay

## 2024-03-25 ENCOUNTER — Ambulatory Visit: Payer: MEDICAID | Admitting: Obstetrics and Gynecology

## 2024-03-25 ENCOUNTER — Telehealth: Payer: Self-pay | Admitting: Obstetrics & Gynecology

## 2024-03-25 ENCOUNTER — Encounter: Payer: Self-pay | Admitting: Obstetrics and Gynecology

## 2024-03-25 VITALS — BP 128/85 | HR 96 | Wt 249.7 lb

## 2024-03-25 DIAGNOSIS — N946 Dysmenorrhea, unspecified: Secondary | ICD-10-CM | POA: Diagnosis not present

## 2024-03-25 DIAGNOSIS — N939 Abnormal uterine and vaginal bleeding, unspecified: Secondary | ICD-10-CM | POA: Diagnosis not present

## 2024-03-25 DIAGNOSIS — N730 Acute parametritis and pelvic cellulitis: Secondary | ICD-10-CM | POA: Diagnosis not present

## 2024-03-25 LAB — POCT URINALYSIS DIP (DEVICE)
Bilirubin Urine: NEGATIVE
Glucose, UA: NEGATIVE mg/dL
Hgb urine dipstick: NEGATIVE
Ketones, ur: NEGATIVE mg/dL
Leukocytes,Ua: NEGATIVE
Nitrite: NEGATIVE
Protein, ur: NEGATIVE mg/dL
Specific Gravity, Urine: 1.015 (ref 1.005–1.030)
Urobilinogen, UA: 0.2 mg/dL (ref 0.0–1.0)
pH: 7 (ref 5.0–8.0)

## 2024-03-25 NOTE — Progress Notes (Signed)
 "   GYNECOLOGY VISIT  Patient name: Amanda Church MRN [id number]  Date of birth: [birth year] Chief Complaint:   Gynecologic Exam  History:  Amanda Church here for aub. Doesn't want pap would prefer to return due to concern for it starting bleeding. She is conerned that her iron  is low due to bleeding and feelign more tired and craivng ice which she hadn't been craving before.  Started working in the summer while kids out of school and stopped in Sept. Took a pregnancy test when she noticed she hadn't had a period in 3 months, which was negative. She initially was using the ring in the summer, and when she noticed she wasn't having a period when she would remove the ring, she left it out.  Mid-sept she removed her Nuvaring and no cycle until Oct. First menses was normal (3d) and then in Nov it would wax and wane and didn't stop until a week ago. Last ring use sometime in Dec. When bleeding started in early Dec she thought it was normal cycle and toward end of second week of continued heavy bleeding, put the ring back in. Just before thanksgiving got sick w/ fever, felt like she had the flu. Given doxy and took every other day due to nausea, and eventually finished it.  Pain has lessened, cramping now, has overall improved. No pain when voiding but has soreness after voiding.    The following portions of the patient's history were reviewed and updated as appropriate: allergies, current medications, past family history, past medical history, past social history, past surgical history and problem list.   Health Maintenance:   Last pap     Component Value Date/Time   DIAGPAP - Low grade squamous intraepithelial lesion (LSIL) (A) 12/26/2022 1125   DIAGPAP (A) 05/16/2020 1412    - High grade squamous intraepithelial lesion (HSIL)   HPVHIGH Negative 12/26/2022 1125   HPVHIGH Positive (A) 05/16/2020 1412   ADEQPAP  12/26/2022 1125    Satisfactory for evaluation; transformation zone component  ABSENT.   ADEQPAP  05/16/2020 1412    Satisfactory for evaluation; transformation zone component PRESENT.    Health Maintenance  Topic Date Due   Pneumococcal Vaccine (1 of 2 - PCV) Never done   Hepatitis B Vaccine (1 of 3 - 19+ 3-dose series) Never done   Flu Shot  10/10/2023   COVID-19 Vaccine (3 - 2025-26 season) 11/10/2023   Pap with HPV screening  12/26/2027   DTaP/Tdap/Td vaccine (4 - Td or Tdap) 07/05/2031   HPV Vaccine (No Doses Required) Completed   Hepatitis C Screening  Completed   HIV Screening  Completed   Meningitis B Vaccine  Aged Out      Review of Systems:  Pertinent items are noted in HPI. Comprehensive review of systems was otherwise negative.   Objective:  Physical Exam BP 128/85   Pulse 96   Wt 249 lb 11.2 oz (113.3 kg)   LMP 02/02/2024 (Approximate)   BMI 47.18 kg/m    Physical Exam Vitals and nursing note reviewed.  Constitutional:      Appearance: Normal appearance.  HENT:     Head: Normocephalic and atraumatic.  Pulmonary:     Effort: Pulmonary effort is normal.  Skin:    General: Skin is warm and dry.  Neurological:     General: No focal deficit present.     Mental Status: She is alert.  Psychiatric:        Mood and Affect: Mood  normal.        Behavior: Behavior normal.        Thought Content: Thought content normal.        Judgment: Judgment normal.      Labs and Imaging FINDINGS:   UTERUS: Uterus measures 11.9 x 5.4 x 6.7 cm, with a volume of 223 ml. The uterus is anteverted and mildly retroflexed. No myometrial mass lesions. No focal abnormalities.   ENDOMETRIAL STRIPE: Endometrial stripe measures 7.2 mm. Endometrial stripe is within normal limits. No endometrial fluid or focal abnormality.   RIGHT OVARY: Right ovary measures 3.8 x 3.3 x 4.2 cm, with a volume of 27 ml. Dominant follicle measuring 2.4 cm. There is normal arterial and venous Doppler flow.   LEFT OVARY: Left ovary measures 2.7 x 1.6 x 2.9 cm, with a  volume of 6.4 ml. There is normal arterial and venous Doppler flow.   FREE FLUID: No free fluid.   IMPRESSION: 1. Dominant right ovarian follicle measuring 2.4 cm, consistent with a normal physiologic finding. No follow-up imaging recommended.   Electronically signed by: Elsie Gravely MD 02/05/2024 01:05 AM EST RP Workstation: [id number]     Assessment & Plan:  1. Abnormal uterine bleeding (AUB) (Primary) 2. Dysmenorrhea 3. PID (acute pelvic inflammatory disease) Labs today to further assess anemia, high suspicion that blood transfusion will be needed. Will continue OTC analgesia for pain. She is ok with blood transfusion if needed, as she has required blood transfusions in the past. If hgb above the threshold for blood transfusion, will proceed with iron  transfusion. Updated pelvic US  ordered given that prior US  done prior to the onset of bleeding. During ED visit for bleeding noted to have fever as well, suspect concomitant URI but unclear if pelvic infection also present. Completed doxycycline  but over the course of ~4 weeks instead of 2 due to intolerance. Urinalysis completed to rule out urinary infection. Will return for separate visit to complete pap due to concerns that exam will cause cramping or restart bleeding.   - CBC - TSH Rfx on Abnormal to Free T4 - US  PELVIC COMPLETE WITH TRANSVAGINAL; Future - Iron , TIBC and Ferritin Panel     Carter Quarry, MD [facility] for [facility], [facility] "

## 2024-03-25 NOTE — Telephone Encounter (Signed)
-  Called patient due to stat lab result- Hgb 6 -Discussed that based on current level would recommend transfusion -pt states that she is at home with her kids and does not have a way to get to the hospital -discussed scheduling transfusion with infusion center ASAP -will also notify Dr. DELENA to follow up about best location of transfusion -advised lots of water, iron -rich food and OTC oral iron  supplement -also discussed precautions and should she note dizziness, light headedness, LOC or other acute concerns pt should come to ED -questions/concerns were addressed and agrees with above plan  Brendan Gruwell, DO Attending Obstetrician & Gynecologist, [facility] for [facility], [facility]

## 2024-03-26 ENCOUNTER — Ambulatory Visit: Payer: Self-pay | Admitting: Obstetrics and Gynecology

## 2024-03-26 DIAGNOSIS — N939 Abnormal uterine and vaginal bleeding, unspecified: Secondary | ICD-10-CM

## 2024-03-26 DIAGNOSIS — D500 Iron deficiency anemia secondary to blood loss (chronic): Secondary | ICD-10-CM

## 2024-03-26 LAB — IRON,TIBC AND FERRITIN PANEL
Ferritin: 3 ng/mL — ABNORMAL LOW (ref 15–150)
Iron Saturation: 2 % — CL (ref 15–55)
Iron: 12 ug/dL — ABNORMAL LOW (ref 27–159)
Total Iron Binding Capacity: 518 ug/dL — ABNORMAL HIGH (ref 250–450)
UIBC: 506 ug/dL — ABNORMAL HIGH (ref 131–425)

## 2024-03-26 LAB — CBC
Hematocrit: 23.1 % — ABNORMAL LOW (ref 34.0–46.6)
Hemoglobin: 6 g/dL — CL (ref 11.1–15.9)
MCH: 16.9 pg — ABNORMAL LOW (ref 26.6–33.0)
MCHC: 26 g/dL — ABNORMAL LOW (ref 31.5–35.7)
MCV: 65 fL — ABNORMAL LOW (ref 79–97)
Platelets: 351 x10E3/uL (ref 150–450)
RBC: 3.55 x10E6/uL — ABNORMAL LOW (ref 3.77–5.28)
RDW: 19 % — ABNORMAL HIGH (ref 11.7–15.4)
WBC: 6.1 x10E3/uL (ref 3.4–10.8)

## 2024-03-26 LAB — TSH RFX ON ABNORMAL TO FREE T4: TSH: 1.03 u[IU]/mL (ref 0.450–4.500)

## 2024-03-26 NOTE — Telephone Encounter (Signed)
 Attempted to call, no answer, LVM. Will send mychart message

## 2024-03-30 ENCOUNTER — Ambulatory Visit (HOSPITAL_COMMUNITY): Payer: MEDICAID | Admitting: Mental Health

## 2024-03-30 ENCOUNTER — Telehealth: Payer: Self-pay

## 2024-03-30 ENCOUNTER — Other Ambulatory Visit (HOSPITAL_COMMUNITY): Payer: Self-pay | Admitting: *Deleted

## 2024-03-30 DIAGNOSIS — D649 Anemia, unspecified: Secondary | ICD-10-CM

## 2024-03-30 NOTE — Telephone Encounter (Signed)
 Unable to reach front office, left VM to contact our office to get patient set of for an appointment for blood transfusion our physician ordered. Will attempt to call again.   Rosaline, RN

## 2024-03-31 ENCOUNTER — Encounter (HOSPITAL_COMMUNITY): Payer: Self-pay

## 2024-03-31 ENCOUNTER — Ambulatory Visit (INDEPENDENT_AMBULATORY_CARE_PROVIDER_SITE_OTHER): Payer: MEDICAID | Admitting: Mental Health

## 2024-03-31 DIAGNOSIS — F319 Bipolar disorder, unspecified: Secondary | ICD-10-CM

## 2024-03-31 DIAGNOSIS — F331 Major depressive disorder, recurrent, moderate: Secondary | ICD-10-CM

## 2024-03-31 DIAGNOSIS — F411 Generalized anxiety disorder: Secondary | ICD-10-CM

## 2024-04-01 ENCOUNTER — Ambulatory Visit (HOSPITAL_COMMUNITY)
Admission: RE | Admit: 2024-04-01 | Discharge: 2024-04-01 | Disposition: A | Discharge status: Home / Self Care | Payer: MEDICAID | Source: Ambulatory Visit | Attending: Obstetrics and Gynecology | Admitting: Obstetrics and Gynecology

## 2024-04-01 DIAGNOSIS — D649 Anemia, unspecified: Secondary | ICD-10-CM | POA: Insufficient documentation

## 2024-04-01 LAB — PREPARE RBC (CROSSMATCH)

## 2024-04-01 MED ORDER — SODIUM CHLORIDE 0.9% IV SOLUTION: Freq: Once | INTRAVENOUS | Status: DC

## 2024-04-01 MED ORDER — ACETAMINOPHEN 500 MG PO TABS: 1000.0000 mg | ORAL_TABLET | Freq: Four times a day (QID) | ORAL | Status: DC | PRN

## 2024-04-01 NOTE — Progress Notes (Unsigned)
 "  THERAPIST PROGRESS NOTE Virtual Visit via Video Note  I connected with Amanda Church on 03/31/2024 at  4:00 PM EST by a video enabled telemedicine application and verified that I am speaking with the correct person using two identifiers.  Location: Patient: address on file Provider: office   I discussed the limitations of evaluation and management by telemedicine and the availability of in person appointments. The patient expressed understanding and agreed to proceed.  I discussed the assessment and treatment plan with the patient. The patient was provided an opportunity to ask questions and all were answered. The patient agreed with the plan and demonstrated an understanding of the instructions.   The patient was advised to call back or seek an in-person evaluation if the symptoms worsen or if the condition fails to improve as anticipated.  I provided 45 minutes of non-face-to-face time during this encounter.   Ty Bernice Savant, [facility]  Session Time: 4:13 pm(45 minutes)  Participation Level: Active  Behavioral Response: CasualAlertWNL  Type of Therapy: Individual Therapy  Treatment Goals addressed: I'm angry or either I am sad. Amanda Church will increase mood regulation AEB development of x 3 effective emotional regulation and distress tolerance skills with self-reported decrease in dysregulation within the next 90 days.  Set boundaries. Amanda Church will increase interpersonal effectiveness AEB development of conflict resolution and communication skills with ability to set boundaries/limits with others and self as needed per self report within the next 90 days.   ProgressTowards Goals: Initial  Interventions: CBT and Supportive  Summary: Amanda Church is a 39 y.o. female who presents with Major depression moderate, generalized anxiety disorder with strong suspicion for bipolar I disorder, exctacy use and alcohol use. Amanda Church presents for tele-health appointment alert and  oriented; mood and affect adquate. Speech clear and ocherant at normal rate and tone. In need of being contacted via telephone to present to session. Shares for there to have been improvement in moods secondary to medication Abilify . Expresses concerns for fluctuations in mood reporting periods of increased irritability at times easily agitated and times she feels is triggered/provoked as well as periods of low moods/sadness Either I am angry, I explode or I am crying. Shares thoughts in regard to relationship of x 13 years and expresses concerns of communication, lack of ability to set boundaries  I end up letting it go and substance use. S/hares would like to be able to discuss things without having to let it go. Explores with therapist current place of communication skills and factors that can get in the way of effective communication. Shares would like to be able to communicate with others better as well as set boundaries and regulate emotions. Agree to transfer to new therapist for increase in availability. Agrees to treatment plan and denies safety concerns.   Suicidal/Homicidal: Nowithout intent/plan  Therapist Response: Therapist engaged Amanda Church in tele-therapy session. Completed check in and reviewed intake information. Reviewed bounds of confidentiality, informed consent and limits of confidentiality. Provided safe space for Amanda Church to share thoughts and feelings in regard to current life stressors and mental health functioning. Provided supportive feedback, validated feelings. Active empathic listening. Discussed and processed concerns for current relationship and educated on barriers to communication and boundary setting. Explored current symptomology, medication compliance and explored desires outcomes. Reviewed session and educated on follow up with additional provider.   Plan: Return again in  x 3 weeks.   Diagnosis: Moderate episode of recurrent major depressive disorder (HCC)  Bipolar I  disorder (HCC)  Generalized anxiety disorder  Collaboration of Care: Other None  Patient/Guardian was advised Release of Information must be obtained prior to any record release in order to collaborate their care with an outside provider. Patient/Guardian was advised if they have not already done so to contact the registration department to sign all necessary forms in order for us  to release information regarding their care.   Consent: Patient/Guardian gives verbal consent for treatment and assignment of benefits for services provided during this visit. Patient/Guardian expressed understanding and agreed to proceed.   Ty Asal [place], [facility] 03/31/2024  "

## 2024-04-02 LAB — TYPE AND SCREEN
ABO/RH(D): A POS
Antibody Screen: NEGATIVE
Unit division: 0
Unit division: 0

## 2024-04-02 LAB — BPAM RBC
Blood Product Expiration Date: 202602072359
Blood Product Expiration Date: 202602032359
ISSUE DATE / TIME: 202601221038
ISSUE DATE / TIME: 202601221229
ISSUING PHYSICIAN: 202601221229
Unit Type and Rh: 202602072359
Unit Type and Rh: 6200
Unit Type and Rh: 6200

## 2024-04-07 ENCOUNTER — Ambulatory Visit (HOSPITAL_COMMUNITY): Payer: MEDICAID

## 2024-04-11 ENCOUNTER — Ambulatory Visit (HOSPITAL_COMMUNITY): Payer: MEDICAID

## 2024-04-15 ENCOUNTER — Encounter (HOSPITAL_COMMUNITY): Payer: MEDICAID | Admitting: Psychiatry
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
## 2024-04-16 ENCOUNTER — Ambulatory Visit (HOSPITAL_COMMUNITY): Payer: MEDICAID

## 2024-04-27 ENCOUNTER — Ambulatory Visit (HOSPITAL_COMMUNITY): Payer: MEDICAID

## 2024-04-29 ENCOUNTER — Encounter (HOSPITAL_COMMUNITY): Payer: MEDICAID | Admitting: Psychiatry

## 2024-05-04 ENCOUNTER — Ambulatory Visit: Payer: Self-pay | Admitting: Obstetrics and Gynecology
